# Patient Record
Sex: Female | Born: 1965
Health system: Southern US, Community
[De-identification: ages and names within clinical notes are randomized; demographics above are authoritative.]

## PROBLEM LIST (undated history)

## (undated) ENCOUNTER — Inpatient Hospital Stay: Admission: EM | Payer: Self-pay | Source: Home / Self Care

## (undated) DIAGNOSIS — I493 Ventricular premature depolarization: Secondary | ICD-10-CM

## (undated) DIAGNOSIS — I10 Essential (primary) hypertension: Secondary | ICD-10-CM

## (undated) DIAGNOSIS — N289 Disorder of kidney and ureter, unspecified: Secondary | ICD-10-CM

## (undated) DIAGNOSIS — R519 Headache, unspecified: Secondary | ICD-10-CM

## (undated) DIAGNOSIS — M797 Fibromyalgia: Secondary | ICD-10-CM

## (undated) DIAGNOSIS — Z8673 Personal history of transient ischemic attack (TIA), and cerebral infarction without residual deficits: Secondary | ICD-10-CM

## (undated) DIAGNOSIS — I639 Cerebral infarction, unspecified: Secondary | ICD-10-CM

## (undated) DIAGNOSIS — D751 Secondary polycythemia: Secondary | ICD-10-CM

## (undated) DIAGNOSIS — G4730 Sleep apnea, unspecified: Secondary | ICD-10-CM

## (undated) DIAGNOSIS — I2510 Atherosclerotic heart disease of native coronary artery without angina pectoris: Secondary | ICD-10-CM

## (undated) DIAGNOSIS — G7111 Myotonic muscular dystrophy: Secondary | ICD-10-CM

## (undated) DIAGNOSIS — I340 Nonrheumatic mitral (valve) insufficiency: Secondary | ICD-10-CM

## (undated) HISTORY — DX: Secondary polycythemia: D75.1

## (undated) HISTORY — DX: Ventricular premature depolarization: I49.3

## (undated) HISTORY — DX: Fibromyalgia: M79.7

## (undated) HISTORY — DX: Myotonic muscular dystrophy: G71.11

## (undated) HISTORY — PX: APPENDECTOMY: SHX54

## (undated) HISTORY — DX: Essential (primary) hypertension: I10

## (undated) HISTORY — PX: CHOLECYSTECTOMY: SHX55

## (undated) HISTORY — PX: ANKLE SURGERY: SHX546

## (undated) HISTORY — PX: TONSILLECTOMY: SUR1361

---

## 2013-07-22 HISTORY — PX: BREAST BIOPSY: SHX20

## 2013-12-17 ENCOUNTER — Ambulatory Visit: Payer: Self-pay

## 2013-12-29 ENCOUNTER — Ambulatory Visit: Payer: Self-pay

## 2014-01-11 ENCOUNTER — Ambulatory Visit: Payer: Self-pay

## 2014-01-12 LAB — PATHOLOGY REPORT

## 2014-06-06 ENCOUNTER — Ambulatory Visit: Payer: Self-pay

## 2015-10-09 ENCOUNTER — Other Ambulatory Visit: Payer: Self-pay | Admitting: Nurse Practitioner

## 2015-10-09 DIAGNOSIS — Z1231 Encounter for screening mammogram for malignant neoplasm of breast: Secondary | ICD-10-CM

## 2015-10-16 ENCOUNTER — Ambulatory Visit: Payer: Self-pay | Attending: Nurse Practitioner

## 2016-07-30 ENCOUNTER — Encounter (HOSPITAL_COMMUNITY): Payer: Self-pay | Admitting: Emergency Medicine

## 2016-07-30 ENCOUNTER — Emergency Department (HOSPITAL_COMMUNITY): Payer: Medicaid Other

## 2016-07-30 ENCOUNTER — Emergency Department (HOSPITAL_COMMUNITY)
Admission: EM | Admit: 2016-07-30 | Discharge: 2016-07-30 | Disposition: A | Discharge status: Home / Self Care | Payer: Medicaid Other | Attending: Emergency Medicine | Admitting: Emergency Medicine

## 2016-07-30 DIAGNOSIS — Z79899 Other long term (current) drug therapy: Secondary | ICD-10-CM | POA: Insufficient documentation

## 2016-07-30 DIAGNOSIS — I10 Essential (primary) hypertension: Secondary | ICD-10-CM | POA: Diagnosis not present

## 2016-07-30 DIAGNOSIS — J189 Pneumonia, unspecified organism: Secondary | ICD-10-CM | POA: Diagnosis not present

## 2016-07-30 DIAGNOSIS — F17210 Nicotine dependence, cigarettes, uncomplicated: Secondary | ICD-10-CM | POA: Diagnosis not present

## 2016-07-30 DIAGNOSIS — R05 Cough: Secondary | ICD-10-CM | POA: Diagnosis present

## 2016-07-30 HISTORY — DX: Essential (primary) hypertension: I10

## 2016-07-30 MED ORDER — KETOROLAC TROMETHAMINE 30 MG/ML IJ SOLN
30.0000 mg | Freq: Once | INTRAMUSCULAR | Status: AC
  Administered 2016-07-30: 30 mg via INTRAMUSCULAR
  Filled 2016-07-30: qty 1

## 2016-07-30 MED ORDER — AZITHROMYCIN 250 MG PO TABS
500.0000 mg | ORAL_TABLET | Freq: Once | ORAL | Status: AC
  Administered 2016-07-30: 500 mg via ORAL
  Filled 2016-07-30: qty 2

## 2016-07-30 MED ORDER — AMOXICILLIN 250 MG PO CAPS
1000.0000 mg | ORAL_CAPSULE | Freq: Once | ORAL | Status: AC
  Administered 2016-07-30: 1000 mg via ORAL
  Filled 2016-07-30: qty 4

## 2016-07-30 MED ORDER — AZITHROMYCIN 250 MG PO TABS: 250.0000 mg | ORAL_TABLET | Freq: Every day | ORAL | 0 refills | Status: DC

## 2016-07-30 MED ORDER — AMOXICILLIN 500 MG PO CAPS: 1000.0000 mg | ORAL_CAPSULE | Freq: Two times a day (BID) | ORAL | 0 refills | Status: DC

## 2016-07-30 NOTE — ED Triage Notes (Signed)
Pt reports fever, aches, and cough since yesterday.

## 2016-08-12 NOTE — ED Provider Notes (Signed)
[facility] DEPT Provider Note   CSN: [id number] Arrival date & time: 07/30/16  [id number]     History   Chief Complaint Chief Complaint  Patient presents with  . Generalized Body Aches    HPI Anita Michael is a 51 y.o. female.  HPI   51 year old female with cough, body aches and subjective fever. Gradual onset yesterday. Persistent since then. Cough is occasionally productive for whitish sputum. Mild shortness of breath. No unusual leg pain or swelling. No rash. No nausea, vomiting or diarrhea. No dizziness or lightheadedness.  Past Medical History:  Diagnosis Date  . Hypertension     There are no active problems to display for this patient.   Past Surgical History:  Procedure Laterality Date  . APPENDECTOMY    . CHOLECYSTECTOMY    . TONSILLECTOMY      OB History    No data available       Home Medications    Prior to Admission medications   Medication Sig Start Date End Date Taking? Authorizing Provider  olmesartan (BENICAR) 40 MG tablet Take 40 mg by mouth daily.   Yes Historical Provider, MD  amoxicillin (AMOXIL) 500 MG capsule Take 2 capsules (1,000 mg total) by mouth 2 (two) times daily. 07/30/16   Virgel Manifold, MD  azithromycin (ZITHROMAX Z-PAK) 250 MG tablet Take 1 tablet (250 mg total) by mouth daily. 2 pills (500mg ) day 1 then 1 pill (250mg ) days 2-5. 07/30/16   Virgel Manifold, MD    Family History History reviewed. No pertinent family history.  Social History Social History  Substance Use Topics  . Smoking status: Current Every Day Smoker    Packs/day: 0.50    Types: Cigarettes  . Smokeless tobacco: Never Used  . Alcohol use No     Allergies   Patient has no known allergies.   Review of Systems Review of Systems  All systems reviewed and negative, other than as noted in HPI.   Physical Exam Updated Vital Signs BP (!) 106/51 (BP Location: Right Arm)   Pulse 89   Temp 98.8 F (37.1 C) (Oral)   Resp 20   Ht 5\' 8"  (1.727 m)   Wt 205  lb (93 kg)   SpO2 92%   BMI 31.17 kg/m   Physical Exam  Constitutional: She appears well-developed and well-nourished. No distress.  HENT:  Head: Normocephalic and atraumatic.  Eyes: Conjunctivae are normal. Right eye exhibits no discharge. Left eye exhibits no discharge.  Neck: Neck supple.  Cardiovascular: Normal rate, regular rhythm and normal heart sounds.  Exam reveals no gallop and no friction rub.   No murmur heard. Pulmonary/Chest: Effort normal. No respiratory distress.  Rhonchorous breath sounds bilaterally. This patient complete sentences. No exertional muscle usage. Occasional cough.  Abdominal: Soft. She exhibits no distension. There is no tenderness.  Musculoskeletal: She exhibits no edema or tenderness.  Lower extremities symmetric as compared to each other. No calf tenderness. Negative Homan's. No palpable cords.   Neurological: She is alert.  Skin: Skin is warm and dry.  Psychiatric: She has a normal mood and affect. Her behavior is normal. Thought content normal.  Nursing note and vitals reviewed.    ED Treatments / Results  Labs (all labs ordered are listed, but only abnormal results are displayed) Labs Reviewed - No data to display  EKG  EKG Interpretation None       Radiology No results found.   Dg Chest 2 View  Result Date: 07/30/2016 CLINICAL DATA:  Cough  and fever since yesterday. Current smoker. History of hypertension. EXAM: CHEST  2 VIEW COMPARISON:  None in PACs FINDINGS: There is patchy interstitial density in the mid and lower lungs bilaterally. No alveolar infiltrates are observed. The heart is top-normal in size. The pulmonary vascularity is normal. The trachea is midline. There is no pleural effusion. The bony thorax exhibits no acute abnormality. IMPRESSION: Bilateral atelectasis or interstitial pneumonia in the mid and lower lungs. No alveolar infiltrates. No CHF. Followup PA and lateral chest X-ray is recommended in 3-4 weeks following  trial of antibiotic therapy to ensure resolution. Electronically Signed   By: David  Martinique M.D.   On: 07/30/2016 08:25    Procedures Procedures (including critical care time)  Medications Ordered in ED Medications  ketorolac (TORADOL) 30 MG/ML injection 30 mg (30 mg Intramuscular Given 07/30/16 0806)  amoxicillin (AMOXIL) capsule 1,000 mg (1,000 mg Oral Given 07/30/16 0859)  azithromycin (ZITHROMAX) tablet 500 mg (500 mg Oral Given 07/30/16 0859)     Initial Impression / Assessment and Plan / ED Course  I have reviewed the triage vital signs and the nursing notes.  Pertinent labs & imaging results that were available during my care of the patient were reviewed by me and considered in my medical decision making (see chart for details).     51 year old female with fever, body aches and cough. Imaging significant for likely pneumonia. Oxygen saturation is borderline, but she has a long smoking history. Her breathing is not significantly increased on exam. At this time I feel that she is appropriate for outpatient treatment. Patient's current or discharge. She'll be started on azithromycin and amoxicillin. Return precautions were discussed. Outpatient follow-up.  Final Clinical Impressions(s) / ED Diagnoses   Final diagnoses:  Community acquired pneumonia, unspecified laterality    New Prescriptions Discharge Medication List as of 07/30/2016  9:02 AM    START taking these medications   Details  amoxicillin (AMOXIL) 500 MG capsule Take 2 capsules (1,000 mg total) by mouth 2 (two) times daily., Starting Tue 07/30/2016, Print    azithromycin (ZITHROMAX Z-PAK) 250 MG tablet Take 1 tablet (250 mg total) by mouth daily. 2 pills (500mg ) day 1 then 1 pill (250mg ) days 2-5., Starting Tue 07/30/2016, Print         Virgel Manifold, MD 08/12/16 1400

## 2017-12-10 ENCOUNTER — Ambulatory Visit: Payer: Self-pay | Admitting: Family Medicine

## 2018-02-12 ENCOUNTER — Other Ambulatory Visit: Payer: Self-pay | Admitting: Family Medicine

## 2018-02-12 DIAGNOSIS — Z1231 Encounter for screening mammogram for malignant neoplasm of breast: Secondary | ICD-10-CM

## 2018-03-02 ENCOUNTER — Ambulatory Visit
Admission: RE | Admit: 2018-03-02 | Discharge: 2018-03-02 | Disposition: A | Discharge status: Home / Self Care | Payer: PRIVATE HEALTH INSURANCE | Source: Ambulatory Visit | Attending: Family Medicine | Admitting: Family Medicine

## 2018-03-02 DIAGNOSIS — Z1231 Encounter for screening mammogram for malignant neoplasm of breast: Secondary | ICD-10-CM | POA: Insufficient documentation

## 2018-05-14 LAB — GLUCOSE, POCT (MANUAL RESULT ENTRY): POC GLUCOSE: 104 mg/dL — AB (ref 70–99)

## 2019-01-18 ENCOUNTER — Telehealth: Payer: Self-pay | Admitting: Family

## 2019-02-16 DIAGNOSIS — G43109 Migraine with aura, not intractable, without status migrainosus: Secondary | ICD-10-CM | POA: Insufficient documentation

## 2019-02-16 DIAGNOSIS — G43909 Migraine, unspecified, not intractable, without status migrainosus: Secondary | ICD-10-CM | POA: Insufficient documentation

## 2019-02-16 DIAGNOSIS — R29810 Facial weakness: Secondary | ICD-10-CM | POA: Insufficient documentation

## 2019-03-25 ENCOUNTER — Telehealth: Payer: Self-pay | Admitting: Family

## 2019-05-10 ENCOUNTER — Other Ambulatory Visit: Payer: Self-pay

## 2019-05-10 ENCOUNTER — Telehealth: Payer: Self-pay | Admitting: Cardiovascular Disease

## 2019-05-10 ENCOUNTER — Encounter: Payer: Self-pay | Admitting: *Deleted

## 2019-05-10 ENCOUNTER — Ambulatory Visit (INDEPENDENT_AMBULATORY_CARE_PROVIDER_SITE_OTHER): Payer: Medicaid Other | Admitting: Cardiovascular Disease

## 2019-05-10 ENCOUNTER — Encounter: Payer: Self-pay | Admitting: Cardiovascular Disease

## 2019-05-10 VITALS — BP 140/83 | HR 72 | Ht 68.0 in | Wt 218.8 lb

## 2019-05-10 DIAGNOSIS — I10 Essential (primary) hypertension: Secondary | ICD-10-CM

## 2019-05-10 DIAGNOSIS — Z23 Encounter for immunization: Secondary | ICD-10-CM

## 2019-05-10 DIAGNOSIS — R079 Chest pain, unspecified: Secondary | ICD-10-CM

## 2019-05-10 DIAGNOSIS — I499 Cardiac arrhythmia, unspecified: Secondary | ICD-10-CM

## 2019-05-10 DIAGNOSIS — R9431 Abnormal electrocardiogram [ECG] [EKG]: Secondary | ICD-10-CM

## 2019-05-10 DIAGNOSIS — I493 Ventricular premature depolarization: Secondary | ICD-10-CM | POA: Diagnosis not present

## 2019-05-10 DIAGNOSIS — R5383 Other fatigue: Secondary | ICD-10-CM

## 2019-05-10 NOTE — Telephone Encounter (Signed)
Pre-cert Verification for the following procedure   Exercise Stress scheduled for 05-24-2019 at [facility].

## 2019-05-10 NOTE — Patient Instructions (Signed)
Your physician recommends that you schedule a follow-up appointment in: [place]  Your physician recommends that you continue on your current medications as directed. Please refer to the Current Medication list given to you today.  Your physician has requested that you have an echocardiogram. Echocardiography is a painless test that uses sound waves to create images of your heart. It provides your doctor with information about the size and shape of your heart and how well your heart's chambers and valves are working. This procedure takes approximately one hour. There are no restrictions for this procedure.  Your physician has requested that you have en exercise stress myoview. For further information please visit [email or web address]. Please follow instruction sheet, as given.  Thank you for choosing [facility]!!

## 2019-05-10 NOTE — Progress Notes (Signed)
CARDIOLOGY CONSULT NOTE  Patient ID: Anita Michael MRN: [id number] DOB/AGE: 30-May-1966 53 y.o.  Admit date: (Not on file) Primary Physician: Wyatt Haste, NP Referring Physician: Alfonse Flavors,*.    Reason for Consultation: Arrhythmia, abnormal EKG  HPI: Anita Michael is a 53 y.o. female who is being seen today for the evaluation of arrhythmia and abnormal EKG at the request of Zhou-Talbert, Serena S,*.   I reviewed notes from her PCP.  I personally reviewed an ECG performed on 04/05/2019 which demonstrated what appeared to be sinus rhythm with frequent PVCs and T wave inversions in leads V3 through V5 and nonspecific T wave abnormalities in V6.  There was a nonspecific intraventricular conduction delay, QRS duration 116 ms.  She has a history of hypertension dating back to when she was 53 years old.  She has no family history of hypertension.  She also has a history of fibromyalgia and has pain in the upper chest, both arms, and upper back.  She recently fell when she was about to sit on a stool and it slid from underneath her.  She hit the left side of her chest.  She told me she had x-rays done and she may have strained a chest wall muscle.  She denies exertional chest pain and dyspnea.  She denies leg swelling, orthopnea, and paroxysmal nocturnal dyspnea.  She denies palpitations, lightheadedness, dizziness, and syncope.  She does describe exertional fatigue.  She has chest pains but it appears to be related to her fibromyalgia as she has chest wall tenderness.  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  I personally reviewed an ECG performed today which demonstrates sinus rhythm with frequent PVCs, diffuse nonspecific T wave abnormalities, and right bundle branch block.  She has been smoking for roughly 30 years and quit within the last several weeks.  Family history: Father died of muscular dystrophy.  Mother had multiple sclerosis.  Social  history: She has been widowed since 2016-11-06.  Her husband died of the flu.  She has 6 children.  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    Allergies  Allergen Reactions  . Procardia [Nifedipine] Other (See Comments)    Heart races    Current Outpatient Medications  Medication Sig Dispense Refill  . cholecalciferol (VITAMIN D3) 25 MCG (1000 UT) tablet Take 1,000 Units by mouth daily.    [name] losartan (COZAAR) 50 MG tablet Take 1 tablet by mouth daily.     No current facility-administered medications for this visit.     Past Medical History:  Diagnosis Date  . Hypertension     Past Surgical History:  Procedure Laterality Date  . APPENDECTOMY    . BREAST BIOPSY Right 2013/11/06   Fibroadenoma  . CHOLECYSTECTOMY    . TONSILLECTOMY      Social History   Socioeconomic History  . Marital status: Married    Spouse name: Not on file  . Number of children: Not on file  . Years of education: Not on file  . Highest education level: Not on file  Occupational History  . Not on file  Social Needs  . Financial resource strain: Not on file  . Food insecurity    Worry: Not on file    Inability: Not on file  . Transportation needs    Medical: Not on file    Non-medical: Not on file  Tobacco Use  .  Smoking status: Former Smoker    Packs/day: 0.50    Types: Cigarettes    Quit date: 02/07/2019    Years since quitting: 0.2  . Smokeless tobacco: Never Used  Substance and Sexual Activity  . Alcohol use: No  . Drug use: No  . Sexual activity: Not on file  Lifestyle  . Physical activity    Days per week: Not on file    Minutes per session: Not on file  . Stress: Not on file  Relationships  . Social Herbalist on phone: Not on file    Gets together: Not on file    Attends religious service: Not on file    Active member of club or organization: Not on file    Attends meetings of clubs or organizations: Not on file     Relationship status: Not on file  . Intimate partner violence    Fear of current or ex partner: Not on file    Emotionally abused: Not on file    Physically abused: Not on file    Forced sexual activity: Not on file  Other Topics Concern  . Not on file  Social History Narrative  . Not on file      Current Meds  Medication Sig  . cholecalciferol (VITAMIN D3) 25 MCG (1000 UT) tablet Take 1,000 Units by mouth daily.  [name] losartan (COZAAR) 50 MG tablet Take 1 tablet by mouth daily.      Review of systems complete and found to be negative unless listed above in HPI    Physical exam Blood pressure (!) 154/98, pulse 63, height 5\' 8"  (1.727 m), weight 218 lb 12.8 oz (99.2 kg), SpO2 96 %. General: NAD Neck: No JVD, no thyromegaly or thyroid nodule.  Lungs: Clear to auscultation bilaterally with normal respiratory effort. CV: Nondisplaced PMI. Regular rate and irregular rhythm with frequent premature contractions, normal S1/S2, no S3/S4, no murmur.  No peripheral edema.  No carotid bruit.    Abdomen: Soft, nontender, no distention.  Skin: Intact without lesions or rashes.  Neurologic: Alert and oriented x 3.  Psych: Normal affect. Extremities: No clubbing or cyanosis.  HEENT: Normal.   ECG: Most recent ECG reviewed.   Labs: No results found for: K, BUN, CREATININE, ALT, TSH, HGB   Lipids: No results found for: LDLCALC, LDLDIRECT, CHOL, TRIG, HDL      ASSESSMENT AND PLAN:  1.  Arrhythmia/abnormal EKG: Initial ECG from 04/05/2019 reviewed above with sinus rhythm, precordial T wave inversions, frequent PVCs, and nonspecific intraventricular conduction delay.  Today's ECG also demonstrates frequent PVCs with diffuse nonspecific T wave abnormalities and right bundle branch block.  She appears to be asymptomatic from this standpoint. I will order a 2-D echocardiogram with Doppler to evaluate cardiac structure, function, and regional wall motion.  2.  Chest pain/exertional fatigue:  While she denies exertional chest pain she does describe exertional fatigue performing activities that she was able to perform with relative ease roughly 1 year ago.  Risk factors include hypertension and tobacco use.  ECG is abnormal and described above.  I will obtain an exercise Myoview to evaluate for ischemic heart disease.  3.  Hypertension: She was diagnosed with hypertension at age 76.  Blood pressure is currently borderline.  This will need further monitoring.    Disposition: Follow up in 3 months  Signed: Kate Sable, M.D., F.A.C.C.  05/10/2019, 10:11 AM

## 2019-05-14 ENCOUNTER — Other Ambulatory Visit (HOSPITAL_COMMUNITY): Payer: PRIVATE HEALTH INSURANCE

## 2019-05-21 ENCOUNTER — Other Ambulatory Visit (HOSPITAL_COMMUNITY)
Admission: RE | Admit: 2019-05-21 | Discharge: 2019-05-21 | Disposition: A | Discharge status: Home / Self Care | Payer: Medicaid Other | Source: Ambulatory Visit | Attending: Cardiovascular Disease | Admitting: Cardiovascular Disease

## 2019-05-21 ENCOUNTER — Other Ambulatory Visit: Payer: Self-pay

## 2019-05-21 DIAGNOSIS — Z20828 Contact with and (suspected) exposure to other viral communicable diseases: Secondary | ICD-10-CM | POA: Insufficient documentation

## 2019-05-21 DIAGNOSIS — Z01812 Encounter for preprocedural laboratory examination: Secondary | ICD-10-CM | POA: Diagnosis present

## 2019-05-21 LAB — SARS CORONAVIRUS 2 (TAT 6-24 HRS): SARS Coronavirus 2: NEGATIVE

## 2019-05-24 ENCOUNTER — Encounter (HOSPITAL_COMMUNITY): Admission: RE | Admit: 2019-05-24 | Payer: Medicaid Other | Source: Ambulatory Visit

## 2019-05-24 ENCOUNTER — Encounter (HOSPITAL_COMMUNITY): Payer: PRIVATE HEALTH INSURANCE

## 2019-05-25 ENCOUNTER — Telehealth: Payer: Self-pay | Admitting: *Deleted

## 2019-05-25 NOTE — Telephone Encounter (Signed)
Notes recorded by Laurine Blazer, LPN on [id number] at QA348G PM EST  Patient notified via detailed voice mail. Copy to pmd.  ------   Notes recorded by Arnoldo Lenis, MD on 05/25/2019 at 12:48 PM EST  Negative covid test    Zandra Abts MD

## 2019-06-03 ENCOUNTER — Telehealth: Payer: Self-pay | Admitting: Cardiovascular Disease

## 2019-06-03 ENCOUNTER — Ambulatory Visit (INDEPENDENT_AMBULATORY_CARE_PROVIDER_SITE_OTHER): Payer: Medicaid Other

## 2019-06-03 ENCOUNTER — Other Ambulatory Visit: Payer: Self-pay

## 2019-06-03 DIAGNOSIS — R079 Chest pain, unspecified: Secondary | ICD-10-CM | POA: Diagnosis not present

## 2019-06-03 NOTE — Telephone Encounter (Signed)
Can go Tuesday 06/08/2019 at [facility].

## 2019-06-03 NOTE — Telephone Encounter (Signed)
Patient returned call

## 2019-06-03 NOTE — Telephone Encounter (Signed)
Patient asking to have EXERCISE MYOVIEW rescheduled.  She arrived too late for the first one.  She will need a new  - COVID TEST scheduled 72 hrs prior to having test done

## 2019-06-03 NOTE — Telephone Encounter (Signed)
Pt aware and appreciative - will forward to schedulers to make this change

## 2019-06-03 NOTE — Telephone Encounter (Signed)
Lexiscan would be fine.

## 2019-06-03 NOTE — Telephone Encounter (Signed)
LM to return call.

## 2019-06-03 NOTE — Telephone Encounter (Signed)
°  Precert needed for: Exercise Myoview   Location: [facility]    Date: Jun 11, 2019

## 2019-06-03 NOTE — Telephone Encounter (Signed)
Pt aware that stress test is scheduled for 11/20 and would need repeat COVID testing - pt needs to contact job to see about taking extra days off - will forward to provider if can change this to [facility] so that pt could avoid another COVID test

## 2019-06-04 ENCOUNTER — Other Ambulatory Visit: Payer: Self-pay | Admitting: *Deleted

## 2019-06-04 DIAGNOSIS — R079 Chest pain, unspecified: Secondary | ICD-10-CM

## 2019-06-04 NOTE — Telephone Encounter (Signed)
Patients Exercise Myoview scheduled for 06-11-2019 has been changed to [facility] scheduled for 06-11-2019

## 2019-06-11 ENCOUNTER — Encounter: Payer: Self-pay | Admitting: *Deleted

## 2019-06-11 ENCOUNTER — Telehealth: Payer: Self-pay | Admitting: *Deleted

## 2019-06-11 ENCOUNTER — Encounter (HOSPITAL_COMMUNITY)
Admission: RE | Admit: 2019-06-11 | Discharge: 2019-06-11 | Disposition: A | Discharge status: Home / Self Care | Payer: Medicaid Other | Source: Ambulatory Visit | Attending: Cardiovascular Disease | Admitting: Cardiovascular Disease

## 2019-06-11 ENCOUNTER — Other Ambulatory Visit: Payer: Self-pay

## 2019-06-11 ENCOUNTER — Other Ambulatory Visit (HOSPITAL_COMMUNITY): Payer: Medicaid Other

## 2019-06-11 ENCOUNTER — Encounter: Payer: Self-pay | Admitting: Cardiovascular Disease

## 2019-06-11 ENCOUNTER — Encounter (HOSPITAL_COMMUNITY): Payer: Medicaid Other

## 2019-06-11 DIAGNOSIS — R079 Chest pain, unspecified: Secondary | ICD-10-CM | POA: Insufficient documentation

## 2019-06-11 NOTE — Telephone Encounter (Signed)
Copy to pmd.    Notes recorded by Laurine Blazer, LPN on [id number] at 5:35 PM EST  Attempted to return call again - not available. Will mail letter for notification.  ------   Notes recorded by Laurine Blazer, LPN on [id number] at 11:40 AM EST  Left message to return call.   ------   Notes recorded by Laurine Blazer, LPN on [id number] at 5:43 PM EST  Left message to return call.   ------   Notes recorded by Herminio Commons, MD on 06/03/2019 at 12:55 PM EST  Pumping function is normal. There is some stiffness when the heart relaxes. Some mitral valve leakage is also noted.

## 2019-06-28 ENCOUNTER — Encounter (HOSPITAL_COMMUNITY)
Admission: RE | Admit: 2019-06-28 | Discharge: 2019-06-28 | Disposition: A | Discharge status: Home / Self Care | Payer: Medicaid Other | Source: Ambulatory Visit | Attending: Cardiovascular Disease | Admitting: Cardiovascular Disease

## 2019-06-28 ENCOUNTER — Telehealth: Payer: Self-pay | Admitting: *Deleted

## 2019-06-28 ENCOUNTER — Other Ambulatory Visit: Payer: Self-pay

## 2019-06-28 ENCOUNTER — Ambulatory Visit (HOSPITAL_COMMUNITY)
Admission: RE | Admit: 2019-06-28 | Discharge: 2019-06-28 | Disposition: A | Discharge status: Home / Self Care | Payer: Medicaid Other | Source: Ambulatory Visit | Attending: Cardiovascular Disease | Admitting: Cardiovascular Disease

## 2019-06-28 DIAGNOSIS — R079 Chest pain, unspecified: Secondary | ICD-10-CM | POA: Diagnosis present

## 2019-06-28 LAB — NM MYOCAR MULTI W/SPECT W/WALL MOTION / EF
LV dias vol: 78 mL (ref 46–106)
LV sys vol: 38 mL
Peak HR: 96 {beats}/min
RATE: 0.42
Rest HR: 62 {beats}/min
SDS: 2
SRS: 5
SSS: 7
TID: 1.08

## 2019-06-28 MED ORDER — REGADENOSON 0.4 MG/5ML IV SOLN
INTRAVENOUS | Status: AC
  Administered 2019-06-28: 0.4 mg via INTRAVENOUS
  Filled 2019-06-28: qty 5

## 2019-06-28 MED ORDER — TECHNETIUM TC 99M TETROFOSMIN IV KIT
10.0000 | PACK | Freq: Once | INTRAVENOUS | Status: AC | PRN
  Administered 2019-06-28: 9 via INTRAVENOUS

## 2019-06-28 MED ORDER — SODIUM CHLORIDE FLUSH 0.9 % IV SOLN
INTRAVENOUS | Status: AC
  Administered 2019-06-28: 10 mL via INTRAVENOUS
  Filled 2019-06-28: qty 10

## 2019-06-28 MED ORDER — TECHNETIUM TC 99M TETROFOSMIN IV KIT
30.0000 | PACK | Freq: Once | INTRAVENOUS | Status: AC | PRN
  Administered 2019-06-28: 32 via INTRAVENOUS

## 2019-06-28 NOTE — Telephone Encounter (Signed)
Notes recorded by Laurine Blazer, LPN on [id number] at [id number] PM EST  Left message to return call.   ------   Notes recorded by Herminio Commons, MD on 06/28/2019 at 1:42 PM EST  Low risk for significant blockages. There were some areas where there may be a mild degree of scar tissue.

## 2019-06-30 NOTE — Telephone Encounter (Signed)
Notes recorded by Laurine Blazer, LPN on [id number] at 579FGE AM EST  Patient notified. Copy to pmd. Follow up scheduled for January 2021 with Dr. Bronson Ing.  ------

## 2019-07-13 ENCOUNTER — Other Ambulatory Visit (HOSPITAL_COMMUNITY): Payer: Self-pay | Admitting: Family

## 2019-07-13 DIAGNOSIS — Z1231 Encounter for screening mammogram for malignant neoplasm of breast: Secondary | ICD-10-CM

## 2019-08-02 ENCOUNTER — Encounter (HOSPITAL_COMMUNITY): Payer: Self-pay

## 2019-08-02 ENCOUNTER — Ambulatory Visit (HOSPITAL_COMMUNITY): Admission: RE | Admit: 2019-08-02 | Payer: Self-pay | Source: Ambulatory Visit

## 2019-08-06 ENCOUNTER — Encounter: Payer: Self-pay | Admitting: Cardiovascular Disease

## 2019-08-06 ENCOUNTER — Ambulatory Visit (INDEPENDENT_AMBULATORY_CARE_PROVIDER_SITE_OTHER): Payer: Medicaid Other | Admitting: Cardiovascular Disease

## 2019-08-06 ENCOUNTER — Other Ambulatory Visit: Payer: Self-pay

## 2019-08-06 VITALS — BP 143/94 | HR 119 | Ht 67.5 in | Wt 220.4 lb

## 2019-08-06 DIAGNOSIS — R5383 Other fatigue: Secondary | ICD-10-CM

## 2019-08-06 DIAGNOSIS — R9431 Abnormal electrocardiogram [ECG] [EKG]: Secondary | ICD-10-CM | POA: Diagnosis not present

## 2019-08-06 DIAGNOSIS — I493 Ventricular premature depolarization: Secondary | ICD-10-CM

## 2019-08-06 DIAGNOSIS — I10 Essential (primary) hypertension: Secondary | ICD-10-CM

## 2019-08-06 DIAGNOSIS — R079 Chest pain, unspecified: Secondary | ICD-10-CM | POA: Diagnosis not present

## 2019-08-06 DIAGNOSIS — R002 Palpitations: Secondary | ICD-10-CM

## 2019-08-06 DIAGNOSIS — I340 Nonrheumatic mitral (valve) insufficiency: Secondary | ICD-10-CM

## 2019-08-06 DIAGNOSIS — I499 Cardiac arrhythmia, unspecified: Secondary | ICD-10-CM

## 2019-08-06 MED ORDER — LOSARTAN POTASSIUM 50 MG PO TABS: 75.0000 mg | ORAL_TABLET | Freq: Every day | ORAL | 1 refills | Status: DC

## 2019-08-06 NOTE — Patient Instructions (Signed)
Your physician recommends that you schedule a follow-up appointment in: [place] physician has recommended you make the following change in your medication:   INCREASE LOSARTAN 75 MG (1.5 TABLETS) DAILY   Thank you for choosing [facility]!!

## 2019-08-06 NOTE — Progress Notes (Signed)
SUBJECTIVE: The patient presents for follow-up after undergoing cardiac testing.  She has a history of hypertension and fibromyalgia.  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  ECG showed resting T wave inversions in leads II, aVF, and V3 through V6 at the time of stress testing.  There is a defect of mild severity present in the mid anteroseptal, mid inferoseptal, and mid inferolateral location.  The anteroseptal defect may have been due to a conduction defect.  A small area of scar cannot entirely be ruled out.  Overall, it was a low risk study with no large ischemic territory seen, calculated EF 51%.  Echocardiogram demonstrated normal LV systolic function, EF 50 to 55%, there was mild LVH and grade 2 diastolic dysfunction.  There was mild to moderate mitral regurgitation.  She has had some left-sided rib pain in the left upper quadrant region ever since the fall.  She denies exertional chest pain.  She has occasional palpitations.  She has fibromyalgia which affects her quadriceps and hamstrings if she has been on her feet too long.  She denies leg swelling per se.  She had just got into an argument with her 56 year old grandson prior to this appointment and thinks that is why her heart rate is elevated today.   Social history: She has been widowed since 2016-10-26.  Her husband died of the flu.  She has 6 children.  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  She has custody of her 34 year old grandson who lives with her.  Review of Systems: As per "subjective", otherwise negative.  Allergies  Allergen Reactions  . Procardia [Nifedipine] Other (See Comments)    Heart races    Current Outpatient Medications  Medication Sig Dispense Refill  . cholecalciferol (VITAMIN D3) 25 MCG (1000 UT) tablet Take 1,000 Units by mouth daily.    [name] losartan (COZAAR) 50 MG tablet Take 1 tablet by mouth daily.     No current facility-administered  medications for this visit.    Past Medical History:  Diagnosis Date  . Hypertension     Past Surgical History:  Procedure Laterality Date  . APPENDECTOMY    . BREAST BIOPSY Right 10-26-13   Fibroadenoma  . CHOLECYSTECTOMY    . TONSILLECTOMY      Social History   Socioeconomic History  . Marital status: Married    Spouse name: Not on file  . Number of children: Not on file  . Years of education: Not on file  . Highest education level: Not on file  Occupational History  . Not on file  Tobacco Use  . Smoking status: Former Smoker    Packs/day: 0.50    Types: Cigarettes    Quit date: 02/07/2019    Years since quitting: 0.4  . Smokeless tobacco: Never Used  Substance and Sexual Activity  . Alcohol use: No  . Drug use: No  . Sexual activity: Not on file  Other Topics Concern  . Not on file  Social History Narrative  . Not on file   Social Determinants of Health   Financial Resource Strain:   . Difficulty of Paying Living Expenses: Not on file  Food Insecurity:   . Worried About Charity fundraiser in the Last Year: Not on file  . Ran Out of Food in the Last Year: Not on file  Transportation Needs:   . Lack of Transportation (Medical): Not on  file  . Lack of Transportation (Non-Medical): Not on file  Physical Activity:   . Days of Exercise per Week: Not on file  . Minutes of Exercise per Session: Not on file  Stress:   . Feeling of Stress : Not on file  Social Connections:   . Frequency of Communication with Friends and Family: Not on file  . Frequency of Social Gatherings with Friends and Family: Not on file  . Attends Religious Services: Not on file  . Active Member of Clubs or Organizations: Not on file  . Attends Archivist Meetings: Not on file  . Marital Status: Not on file  Intimate Partner Violence:   . Fear of Current or Ex-Partner: Not on file  . Emotionally Abused: Not on file  . Physically Abused: Not on file  . Sexually Abused: Not on  file     Vitals:   08/06/19 1616  BP: (!) 143/94  Pulse: (!) 119  SpO2: 97%  Weight: 220 lb 6.4 oz (100 kg)  Height: 5' 7.5" (1.715 m)    Wt Readings from Last 3 Encounters:  08/06/19 220 lb 6.4 oz (100 kg)  05/10/19 218 lb 12.8 oz (99.2 kg)  07/30/16 205 lb (93 kg)     PHYSICAL EXAM General: NAD HEENT: Normal. Neck: No JVD, no thyromegaly. Lungs: Clear to auscultation bilaterally with normal respiratory effort. CV: Mildly tachycardic, regular rhythm, normal S1/S2, no S3/S4, no murmur. No pretibial or periankle edema.   Abdomen: Soft, nontender, no distention.  Neurologic: Alert and oriented.  Psych: Normal affect. Skin: Normal. Musculoskeletal: No gross deformities.      Labs: No results found for: K, BUN, CREATININE, ALT, TSH, HGB   Lipids: No results found for: LDLCALC, LDLDIRECT, CHOL, TRIG, HDL     ASSESSMENT AND PLAN:  1.  Chest pain/exertional fatigue: Nuclear stress test was low risk as detailed above.  A small area of scar could not entirely be ruled out.  There were no large ischemic territories.  2.  Arrhythmia/abnormal EKG: Echocardiogram demonstrated normal LV systolic function with grade 2 diastolic dysfunction.  No diuretic requirement at this time.  3.  Mitral regurgitation: Mild to moderate in severity by most recent echocardiogram.  4.  Hypertension: Blood pressure is mildly elevated.  I will increase losartan 75 mg daily.  5.  Palpitations: She has documented PVCs.  Symptomatically stable at present.   Disposition: Follow up 3 months virtual visit   Kate Sable, M.D., F.A.C.C.

## 2019-08-13 ENCOUNTER — Ambulatory Visit: Payer: PRIVATE HEALTH INSURANCE | Admitting: Cardiovascular Disease

## 2019-10-19 DIAGNOSIS — Z809 Family history of malignant neoplasm, unspecified: Secondary | ICD-10-CM | POA: Insufficient documentation

## 2019-10-19 DIAGNOSIS — D751 Secondary polycythemia: Secondary | ICD-10-CM | POA: Insufficient documentation

## 2019-10-28 ENCOUNTER — Other Ambulatory Visit (HOSPITAL_COMMUNITY): Payer: Self-pay | Admitting: General Practice

## 2019-10-28 DIAGNOSIS — Z1231 Encounter for screening mammogram for malignant neoplasm of breast: Secondary | ICD-10-CM

## 2019-11-11 ENCOUNTER — Ambulatory Visit (HOSPITAL_COMMUNITY)
Admission: RE | Admit: 2019-11-11 | Discharge: 2019-11-11 | Disposition: A | Discharge status: Home / Self Care | Payer: Medicaid Other | Source: Ambulatory Visit | Attending: General Practice | Admitting: General Practice

## 2019-11-11 ENCOUNTER — Encounter (HOSPITAL_COMMUNITY): Payer: Self-pay

## 2019-11-11 DIAGNOSIS — Z09 Encounter for follow-up examination after completed treatment for conditions other than malignant neoplasm: Secondary | ICD-10-CM | POA: Insufficient documentation

## 2019-11-11 DIAGNOSIS — K921 Melena: Secondary | ICD-10-CM | POA: Insufficient documentation

## 2019-11-11 DIAGNOSIS — R58 Hemorrhage, not elsewhere classified: Secondary | ICD-10-CM | POA: Insufficient documentation

## 2019-11-11 DIAGNOSIS — Z1211 Encounter for screening for malignant neoplasm of colon: Secondary | ICD-10-CM | POA: Insufficient documentation

## 2019-11-11 DIAGNOSIS — Z1231 Encounter for screening mammogram for malignant neoplasm of breast: Secondary | ICD-10-CM | POA: Insufficient documentation

## 2019-11-16 ENCOUNTER — Encounter: Payer: Self-pay | Admitting: Cardiovascular Disease

## 2019-11-16 ENCOUNTER — Telehealth (INDEPENDENT_AMBULATORY_CARE_PROVIDER_SITE_OTHER): Payer: Medicaid Other | Admitting: Cardiovascular Disease

## 2019-11-16 VITALS — BP 159/90 | Ht 68.0 in | Wt 217.0 lb

## 2019-11-16 DIAGNOSIS — R9431 Abnormal electrocardiogram [ECG] [EKG]: Secondary | ICD-10-CM

## 2019-11-16 DIAGNOSIS — I493 Ventricular premature depolarization: Secondary | ICD-10-CM

## 2019-11-16 DIAGNOSIS — I499 Cardiac arrhythmia, unspecified: Secondary | ICD-10-CM

## 2019-11-16 DIAGNOSIS — R079 Chest pain, unspecified: Secondary | ICD-10-CM

## 2019-11-16 DIAGNOSIS — R5383 Other fatigue: Secondary | ICD-10-CM | POA: Diagnosis not present

## 2019-11-16 DIAGNOSIS — Z87891 Personal history of nicotine dependence: Secondary | ICD-10-CM

## 2019-11-16 DIAGNOSIS — I10 Essential (primary) hypertension: Secondary | ICD-10-CM

## 2019-11-16 DIAGNOSIS — I340 Nonrheumatic mitral (valve) insufficiency: Secondary | ICD-10-CM

## 2019-11-16 DIAGNOSIS — R002 Palpitations: Secondary | ICD-10-CM

## 2019-11-16 MED ORDER — ISOSORBIDE MONONITRATE ER 30 MG PO TB24: 30.0000 mg | ORAL_TABLET | Freq: Every day | ORAL | 6 refills | Status: DC

## 2019-11-16 MED ORDER — LOSARTAN POTASSIUM 100 MG PO TABS: 100.0000 mg | ORAL_TABLET | Freq: Every day | ORAL | 6 refills | Status: DC

## 2019-11-16 NOTE — Addendum Note (Signed)
Addended by: Laurine Blazer on: 11/16/2019 11:47 AM   Modules accepted: Orders

## 2019-11-16 NOTE — Progress Notes (Signed)
Virtual Visit via Telephone Note   This visit type was conducted due to national recommendations for restrictions regarding the COVID-19 Pandemic (e.g. social distancing) in an effort to limit this patient's exposure and mitigate transmission in our community.  Due to her co-morbid illnesses, this patient is at least at moderate risk for complications without adequate follow up.  This format is felt to be most appropriate for this patient at this time.  The patient did not have access to video technology/had technical difficulties with video requiring transitioning to audio format only (telephone).  All issues noted in this document were discussed and addressed.  No physical exam could be performed with this format.  Please refer to the patient's chart for her  consent to telehealth for [facility].   The patient was identified using 2 identifiers.  Date:  11/16/2019   ID:  Anita Michael, DOB Dec 02, 1965, MRN [id number]  Patient Location: Home Provider Location: Office  PCP:  Wyatt Haste, NP  Cardiologist:  Kate Sable, MD  Electrophysiologist:  None   Evaluation Performed:  Follow-Up Visit  Chief Complaint:  Chest pain  History of Present Illness:    Anita Michael is a 54 y.o. female with chest pain, hypertension, and fibromyalgia.  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  ECG showed resting T wave inversions in leads II, aVF, and V3 through V6 at the time of stress testing.  NST on 06/28/19 showed a defect of mild severity present in the mid anteroseptal, mid inferoseptal, and mid inferolateral location.  The anteroseptal defect may have been due to a conduction defect.  A small area of scar cannot entirely be ruled out.  Overall, it was a low risk study with no large ischemic territory seen, calculated EF 51%.  Echocardiogram on 06/03/19 demonstrated normal LV systolic function, EF 50 to 55%, there was mild LVH and grade 2 diastolic dysfunction.  There was mild  to moderate mitral regurgitation.  She has episodes of chest pain located in the retrosternal region and in the upper central part of her chest.  They occur about once per week.  The last episode was 2 weeks ago and lasted for about 30 minutes.  She denies leg swelling, palpitations, orthopnea, and shortness of breath.  She has been diagnosed with polycythemia and is followed by hematology at [facility].     Past Medical History:  Diagnosis Date  . Hypertension    Past Surgical History:  Procedure Laterality Date  . APPENDECTOMY    . BREAST BIOPSY Right 2015   Fibroadenoma  . CHOLECYSTECTOMY    . TONSILLECTOMY       Current Meds  Medication Sig  . AIMOVIG 70 MG/ML SOAJ Inject 70 mg as directed every 30 (thirty) days.  . cholecalciferol (VITAMIN D3) 25 MCG (1000 UT) tablet Take 1,000 Units by mouth daily.  . cyclobenzaprine (FLEXERIL) 10 MG tablet Take 1 tablet by mouth at bedtime.  [name] losartan (COZAAR) 50 MG tablet Take 1.5 tablets (75 mg total) by mouth daily.     Allergies:   Procardia [nifedipine]   Social History   Tobacco Use  . Smoking status: Former Smoker    Packs/day: 0.50    Types: Cigarettes    Quit date: 02/07/2019    Years since quitting: 0.7  . Smokeless tobacco: Never Used  Substance Use Topics  . Alcohol use: No  . Drug use: No     Family Hx: The patient's family history includes Breast  cancer (age of onset: 68) in her sister; Breast cancer (age of onset: 74) in her sister; Breast cancer (age of onset: 92) in her mother; Breast cancer (age of onset: 60) in her maternal grandmother.  ROS:   Please see the history of present illness.     All other systems reviewed and are negative.   Prior CV studies:   The following studies were reviewed today:  Reviewed above  Labs/Other Tests and Data Reviewed:    EKG:  No ECG reviewed.  Recent Labs: No results found for requested labs within last 8760 hours.   Recent Lipid Panel No results  found for: CHOL, TRIG, HDL, CHOLHDL, LDLCALC, LDLDIRECT  Wt Readings from Last 3 Encounters:  11/16/19 217 lb (98.4 kg)  08/06/19 220 lb 6.4 oz (100 kg)  05/10/19 218 lb 12.8 oz (99.2 kg)     Objective:    Vital Signs:  BP (!) 159/90 Comment: done at oncology office last week  Ht 5\' 8"  (1.727 m)   Wt 217 lb (98.4 kg)   BMI 32.99 kg/m    VITAL SIGNS:  reviewed  ASSESSMENT & PLAN:    1.  Chest pain/exertional fatigue: Nuclear stress test was low risk as detailed above.  A small area of scar could not entirely be ruled out.  There were no large ischemic territories.  She continues to experience episodic chest pains lasting up to 30 minutes.  I will start Imdur 30 mg daily.  2.  Arrhythmia/abnormal EKG: Echocardiogram demonstrated normal LV systolic function with grade 2 diastolic dysfunction.  No diuretic requirement at this time.  3.  Mitral regurgitation: Mild to moderate in severity by most recent echocardiogram.  4.  Hypertension: Blood pressure is elevated.  I will increase losartan to 100 mg daily and add Imdur 30 mg daily for chest pain.  5.  Palpitations: She has documented PVCs.  Symptomatically stable at present.  6.  Polycythemia: Followed by hematology at [facility].   COVID-19 Education: The signs and symptoms of COVID-19 were discussed with the patient and how to seek care for testing (follow up with PCP or arrange E-visit).  The importance of social distancing was discussed today.  Time:   Today, I have spent 25 minutes with the patient with telehealth technology discussing the above problems.     Medication Adjustments/Labs and Tests Ordered: Current medicines are reviewed at length with the patient today.  Concerns regarding medicines are outlined above.   Tests Ordered: No orders of the defined types were placed in this encounter.   Medication Changes: No orders of the defined types were placed in this encounter.   Follow Up:  Virtual Visit   in 3 month(s)  Signed, Kate Sable, MD  11/16/2019 10:00 AM    [place]

## 2019-11-16 NOTE — Patient Instructions (Addendum)
Medication Instructions:   Increase Losartan to 100mg  daily.   Begin Imdur 30mg  daily.   New prescriptions sent to [facility] today.   Continue all other medications.    Labwork: none  Testing/Procedures: none  Follow-Up: 3 months   Any Other Special Instructions Will Be Listed Below (If Applicable).  If you need a refill on your cardiac medications before your next appointment, please call your pharmacy.

## 2019-11-18 ENCOUNTER — Other Ambulatory Visit (HOSPITAL_COMMUNITY): Payer: Self-pay | Admitting: General Practice

## 2019-11-18 DIAGNOSIS — N6320 Unspecified lump in the left breast, unspecified quadrant: Secondary | ICD-10-CM

## 2019-11-22 ENCOUNTER — Ambulatory Visit (HOSPITAL_COMMUNITY): Payer: Medicaid Other

## 2019-12-14 ENCOUNTER — Encounter: Payer: Self-pay | Admitting: Cardiovascular Disease

## 2019-12-14 ENCOUNTER — Encounter (HOSPITAL_COMMUNITY): Payer: Medicaid Other

## 2019-12-14 ENCOUNTER — Ambulatory Visit (HOSPITAL_COMMUNITY): Payer: Medicaid Other

## 2019-12-14 DIAGNOSIS — D126 Benign neoplasm of colon, unspecified: Secondary | ICD-10-CM | POA: Insufficient documentation

## 2019-12-15 NOTE — Progress Notes (Deleted)
Cardiology Office Note  Date: 12/16/2019   ID: Anita Michael, DOB 1966/02/18, MRN [id number]  PCP:  Wyatt Haste, NP  Cardiologist:  Kate Sable, MD Electrophysiologist:  None   Chief Complaint: F/U chest pain, HTN  History of Present Illness: Anita Michael is a 53 y.o. female with a history of CP and HTN, COPD long term smoker who quit 6 months ago.  Last encounter with Dr. Bronson Ing via telemedicine on 11/16/2019. She was having episodes of retrosternal and upper central chest areas occurring once per week. The last episode had occurred 2 weeks prior lasting for 30 minutes. Imdur 30 mg was started. Losartan was increased to 100 mg daily d/t elevated BP.  Recent ED visit to [facility] 12/06/2019 for abdominal pain 3 days post screening colonoscopy. The evening post colonoscopy she began having upper abdominal pain, described as constant, sharp, severe. Worse with eating. No alleviating factors. Associated N/V. CT chest/ abdomen / pelvis : No PE, heterogeneous hazy bilateral lung densities suggesting possible small airway disease. No evidence of acute intra-abdominal or pelvic abnormality. Marked atrophic R kidney, calcified uterine fibroid.   Past Medical History:  Diagnosis Date  . Hypertension     Past Surgical History:  Procedure Laterality Date  . APPENDECTOMY    . BREAST BIOPSY Right 2015   Fibroadenoma  . CHOLECYSTECTOMY    . TONSILLECTOMY      Current Outpatient Medications  Medication Sig Dispense Refill  . AIMOVIG 70 MG/ML SOAJ Inject 70 mg as directed every 30 (thirty) days.    . cholecalciferol (VITAMIN D3) 25 MCG (1000 UT) tablet Take 1,000 Units by mouth daily.    . cyclobenzaprine (FLEXERIL) 10 MG tablet Take 1 tablet by mouth at bedtime.    . isosorbide mononitrate (IMDUR) 30 MG 24 hr tablet Take 1 tablet (30 mg total) by mouth daily. 30 tablet 6  . losartan (COZAAR) 100 MG tablet Take 1 tablet (100 mg total) by mouth daily. 30 tablet 6   No current  facility-administered medications for this visit.   Allergies:  Procardia [nifedipine]   Social History: The patient  reports that she quit smoking about 10 months ago. Her smoking use included cigarettes. She smoked 0.50 packs per day. She has never used smokeless tobacco. She reports that she does not drink alcohol or use drugs.   Family History: The patient's family history includes Breast cancer (age of onset: 87) in her sister; Breast cancer (age of onset: 59) in her sister; Breast cancer (age of onset: 33) in her mother; Breast cancer (age of onset: 66) in her maternal grandmother.   ROS:  Please see the history of present illness. Otherwise, complete review of systems is positive for none.  All other systems are reviewed and negative.   Physical Exam: VS:  There were no vitals taken for this visit., BMI There is no height or weight on file to calculate BMI.  Wt Readings from Last 3 Encounters:  11/16/19 217 lb (98.4 kg)  08/06/19 220 lb 6.4 oz (100 kg)  05/10/19 218 lb 12.8 oz (99.2 kg)    General: Patient appears comfortable at rest. HEENT: Conjunctiva and lids normal, oropharynx clear with moist mucosa. Neck: Supple, no elevated JVP or carotid bruits, no thyromegaly. Lungs: Clear to auscultation, nonlabored breathing at rest. Cardiac: Regular rate and rhythm, no S3 or significant systolic murmur, no pericardial rub. Abdomen: Soft, nontender, no hepatomegaly, bowel sounds present, no guarding or rebound. Extremities: No pitting edema, distal pulses 2+.  Skin: Warm and dry. Musculoskeletal: No kyphosis. Neuropsychiatric: Alert and oriented x3, affect grossly appropriate.  ECG:  {EKG/Telemetry Strips Reviewed:[phone]}  Recent Labwork: No results found for requested labs within last 8760 hours.  No results found for: CHOL, TRIG, HDL, CHOLHDL, VLDL, LDLCALC, LDLDIRECT  Other Studies Reviewed Today:   Assessment and Plan:  1. Chest pain, unspecified type   2. Abnormal  EKG   3. Frequent PVCs   4. Cardiac arrhythmia, unspecified cardiac arrhythmia type   5. Other fatigue   6. Essential hypertension   7. Mitral valve insufficiency, unspecified etiology   8. Palpitations      Medication Adjustments/Labs and Tests Ordered: Current medicines are reviewed at length with the patient today.  Concerns regarding medicines are outlined above.   Disposition: Follow-up with ***  Signed, Levell July, NP 12/16/2019 12:13 AM    [facility] [place], [place], [place] [address] Phone: [phone]; Fax: [phone]

## 2019-12-16 ENCOUNTER — Ambulatory Visit: Payer: Medicaid Other | Admitting: Family Medicine

## 2019-12-24 ENCOUNTER — Other Ambulatory Visit: Payer: Self-pay

## 2019-12-24 ENCOUNTER — Encounter: Payer: Self-pay | Admitting: Family Medicine

## 2019-12-24 ENCOUNTER — Ambulatory Visit (INDEPENDENT_AMBULATORY_CARE_PROVIDER_SITE_OTHER): Payer: Medicaid Other | Admitting: Family Medicine

## 2019-12-24 VITALS — BP 124/72 | HR 57 | Ht 67.5 in | Wt 216.4 lb

## 2019-12-24 DIAGNOSIS — J449 Chronic obstructive pulmonary disease, unspecified: Secondary | ICD-10-CM

## 2019-12-24 DIAGNOSIS — R079 Chest pain, unspecified: Secondary | ICD-10-CM

## 2019-12-24 DIAGNOSIS — I10 Essential (primary) hypertension: Secondary | ICD-10-CM | POA: Diagnosis not present

## 2019-12-24 DIAGNOSIS — R1010 Upper abdominal pain, unspecified: Secondary | ICD-10-CM | POA: Diagnosis not present

## 2019-12-24 NOTE — Patient Instructions (Signed)
Medication Instructions:  Continue all current medications.  Labwork: none  Testing/Procedures: none  Follow-Up: 3 months   Any Other Special Instructions Will Be Listed Below (If Applicable).  If you need a refill on your cardiac medications before your next appointment, please call your pharmacy.  

## 2019-12-24 NOTE — Progress Notes (Addendum)
Cardiology Office Note  Date: 04/05/2020   ID: Anita Michael, DOB 10-20-1965, MRN [id number]  PCP:  Dara Lords, NP  Cardiologist:  No primary care provider on file. Electrophysiologist:  None   Chief Complaint: F/U chest pain, HTN  History of Present Illness: Anita Michael is a 54 y.o. female with a history of CP and HTN, COPD long term smoker who quit 6 months ago.  Last encounter with Dr. Bronson Ing via telemedicine on 11/16/2019. She was having episodes of retrosternal and upper central chest areas occurring once per week. The last episode had occurred 2 weeks prior lasting for 30 minutes. Imdur 30 mg was started. Losartan was increased to 100 mg daily d/t elevated BP.  Recent ED visit to [facility] 12/06/2019 for abdominal pain 3 days post screening colonoscopy. The evening after colonoscopy she began having upper abdominal pain, described as constant, sharp, severe. Worse with eating. No alleviating factors. Associated N/V. CT chest/ abdomen / pelvis : No PE, heterogeneous hazy bilateral lung densities suggesting possible small airway disease. No evidence of acute intra-abdominal or pelvic abnormality. Marked atrophic R kidney, calcified uterine fibroid.  Her work-up in the ER was negative for acute coronary syndrome was negative troponins x2.  She had a tubulovillous adenoma at 50 cm found on colonoscopy. Due to low oxygen saturations and increasing PVCs, the procedure had to be terminated before visualizing the cecum. The ileocecal valve could be seen but the scope was not advanced into the cecum before anesthesia asked the procedure to be terminated.  Gastroenterology is referring her to [facility] gastroenterology for a repeat colonoscopy within a year for follow-up of this polyp as well as visualization of the cecum.    Patient states she continues to have some chest pains which occur with and without exertion.  She has fibromyalgia and states she has pains at multiple areas  on her chest, back, shoulder, arms and she is unable to discern as to whether they are heart related or related to her fibromyalgia.  She denies any other symptoms such as nausea, vomiting, or diaphoresis.  No blood in her stool since the colonoscopy.  She does have polycythemia and is seeing hematology oncology for this issue.  She has a pending sleep study to evaluate for OSA.  She denies any strokelike symptoms, orthostatic symptoms, palpitations.  No claudication-like symptoms, DVT or PE-like symptoms, or lower extremity edema.  She does state when she eats sometimes she has epigastric pain and when eating food such as meat or potatoes the food seems like it may get stuck for short time.  She has never had an EGD in the past.  She denies any reflux symptoms.   Past Medical History:  Diagnosis Date   Fibromyalgia    Hypertension    Polycythemia     Past Surgical History:  Procedure Laterality Date   APPENDECTOMY     BREAST BIOPSY Right 2015   Fibroadenoma   CHOLECYSTECTOMY     TONSILLECTOMY      Current Outpatient Medications  Medication Sig Dispense Refill   AIMOVIG 70 MG/ML SOAJ Inject 70 mg as directed every 30 (thirty) days.     amitriptyline (ELAVIL) 50 MG tablet Take 50 mg by mouth at bedtime.     cholecalciferol (VITAMIN D3) 25 MCG (1000 UT) tablet Take 1,000 Units by mouth daily.     cyclobenzaprine (FLEXERIL) 10 MG tablet Take 1 tablet by mouth at bedtime.     isosorbide mononitrate (IMDUR) 30  MG 24 hr tablet Take 1 tablet (30 mg total) by mouth daily. 30 tablet 6   losartan (COZAAR) 100 MG tablet Take 1 tablet (100 mg total) by mouth daily. 30 tablet 6   albuterol (VENTOLIN HFA) 108 (90 Base) MCG/ACT inhaler Inhale 2 puffs into the lungs every 4 (four) hours as needed.     CHANTIX CONTINUING MONTH PAK 1 MG tablet Take by mouth as directed.     estradiol (ESTRACE) 0.1 MG/GM vaginal cream      nystatin (MYCOSTATIN/NYSTOP) powder      nystatin ointment  (MYCOSTATIN) APPLY TOPICALLY TOAAFFECTED AREA TWICE DAILY.     PREMARIN vaginal cream APPLY (0.5)MG VAGINALLY(THREE TIMES A WEEK FOR 2 WEEKS. THEN APPLY WEEKLY AS DIRECTED.     No current facility-administered medications for this visit.   Allergies:  Procardia [nifedipine], Latex, and Other   Social History: The patient  reports that she quit smoking about 13 months ago. Her smoking use included cigarettes. She smoked 0.50 packs per day. She has never used smokeless tobacco. She reports that she does not drink alcohol and does not use drugs.   Family History: The patient's family history includes Breast cancer (age of onset: 75) in her sister; Breast cancer (age of onset: 36) in her sister; Breast cancer (age of onset: 55) in her mother; Breast cancer (age of onset: 43) in her maternal grandmother.   ROS:  Please see the history of present illness. Otherwise, complete review of systems is positive for none.  All other systems are reviewed and negative.   Physical Exam: VS:  BP 124/72    Pulse (!) 57    Ht 5' 7.5" (1.715 m)    Wt 216 lb 6.4 oz (98.2 kg)    SpO2 95%    BMI 33.39 kg/m , BMI Body mass index is 33.39 kg/m.  Wt Readings from Last 3 Encounters:  03/16/20 210 lb (95.3 kg)  02/10/20 210 lb (95.3 kg)  12/24/19 216 lb 6.4 oz (98.2 kg)    General: Patient appears comfortable at rest. Neck: Supple, no elevated JVP or carotid bruits, no thyromegaly. Lungs: Clear to auscultation, nonlabored breathing at rest. Cardiac: Regular rate and rhythm, no S3 or significant systolic murmur, no pericardial rub. Extremities: No pitting edema, distal pulses 2+. Skin: Warm and dry. Musculoskeletal: No kyphosis. Neuropsychiatric: Alert and oriented x3, affect grossly appropriate.  ECG: EKG at [facility] on 12/07/2019 showed sinus rhythm with right bundle branch block, nonspecific changes and ST segment.  Rate of 65  Recent Labwork: No results found for requested labs within last 8760 hours.   No results found for: CHOL, TRIG, HDL, CHOLHDL, VLDL, LDLCALC, LDLDIRECT  Other Studies Reviewed Today:  Nuclear stress test 06/28/2019   Resting T wave inversions in leads II, aVF, and V3-6.  Defect 1: There is a medium defect of mild severity present in the mid anteroseptal, mid inferoseptal and mid inferolateral location. The anterosepal defect may be due to conduction defect. A small area of scar cannot entirely be ruled out.  This is a low risk study. No large ischemic territories.  Nuclear stress EF: 51%.     Echocardiogram 06/03/2019  1. Left ventricular ejection fraction, by visual estimation, is 50 to 55%. The left ventricle has normal function. There is mildly increased left ventricular hypertrophy. 2. Left ventricular diastolic parameters are consistent with Grade II diastolic dysfunction (pseudonormalization). 3. Global right ventricle has low normal systolic function.The right ventricular size is normal. No increase in  right ventricular wall thickness. 4. Left atrial size was normal. 5. Right atrial size was normal. 6. The mitral valve is grossly normal. Mild to moderate mitral valve regurgitation. 7. The tricuspid valve is grossly normal. Tricuspid valve regurgitation is trivial. 8. The aortic valve is tricuspid. Aortic valve regurgitation is not visualized. No evidence of aortic valve sclerosis or stenosis. 9. The pulmonic valve was grossly normal. Pulmonic valve regurgitation is not visualized. 10. Normal pulmonary artery systolic pressure. 11. The inferior vena cava is normal in size with greater than 50% respiratory variability, suggesting right atrial pressure of 3 mmHg.   Assessment and Plan:  1. Chest pain, unspecified type   2. Essential hypertension   3. Pain of upper abdomen   4. Chronic obstructive pulmonary disease, unspecified COPD type ([place])     1. Chest pain, unspecified type Recent episodes of chest pain after colonoscopy.  She was treated  and released from [facility] after work-up for ACS.  She was negative for ACS.  CT showed no evidence of PE.  She continues to have chest pain on and off with and without activity.  She is currently taking Imdur 30 mg daily for chest pain.  She states there is no predictable pattern to the chest pain states it can occur with activity, without activity.  States she is unable to discern as to whether the chest pain is a result of her fibromyalgia or another cause.  Continue Imdur 30 mg.  2. Essential hypertension Blood pressure well controlled on current therapy continue losartan 100 mg daily.  3. Pain of upper abdomen Continues to have chest pain/epigastric pain and can sometimes be associated with eating larger meals.  Also states when attempting to swallow sometimes she feels the food gets stuck on occasion.  Advised her to follow-up with her GI specialist for possible EGD.  4. Chronic obstructive pulmonary disease, unspecified COPD type ([place]) Denies any significant exertional dyspnea.  States she stopped smoking approximately 6 months ago.  Patient is seen in hematology oncology for polycythemia.  She was scheduled for sleep study but missed the appointment by 5 minutes and needed to be rescheduled.  Study was ordered to check for OSA and if it may be contributing to the  polycythemia.    Medication Adjustments/Labs and Tests Ordered: Current medicines are reviewed at length with the patient today.  Concerns regarding medicines are outlined above.   Disposition: Follow-up with Dr. Bronson Ing or APP Signed, Levell July, NP 04/05/2020 8:29 PM    [facility] at [place], [place], [place] [address] Phone: [phone]; Fax: [phone]

## 2020-01-04 ENCOUNTER — Ambulatory Visit (HOSPITAL_COMMUNITY)
Admission: RE | Admit: 2020-01-04 | Discharge: 2020-01-04 | Disposition: A | Discharge status: Home / Self Care | Payer: Medicaid Other | Source: Ambulatory Visit | Attending: General Practice | Admitting: General Practice

## 2020-01-04 ENCOUNTER — Ambulatory Visit (HOSPITAL_COMMUNITY): Admission: RE | Admit: 2020-01-04 | Payer: Medicaid Other | Source: Ambulatory Visit

## 2020-01-04 ENCOUNTER — Other Ambulatory Visit: Payer: Self-pay

## 2020-01-04 DIAGNOSIS — N6320 Unspecified lump in the left breast, unspecified quadrant: Secondary | ICD-10-CM

## 2020-01-06 ENCOUNTER — Other Ambulatory Visit (HOSPITAL_BASED_OUTPATIENT_CLINIC_OR_DEPARTMENT_OTHER): Payer: Self-pay

## 2020-01-06 DIAGNOSIS — E669 Obesity, unspecified: Secondary | ICD-10-CM

## 2020-01-06 DIAGNOSIS — G478 Other sleep disorders: Secondary | ICD-10-CM

## 2020-01-06 DIAGNOSIS — D751 Secondary polycythemia: Secondary | ICD-10-CM

## 2020-02-01 ENCOUNTER — Other Ambulatory Visit (HOSPITAL_COMMUNITY): Payer: Medicaid Other

## 2020-02-03 ENCOUNTER — Other Ambulatory Visit: Payer: Self-pay

## 2020-02-03 ENCOUNTER — Ambulatory Visit: Payer: Medicaid Other | Attending: Internal Medicine | Admitting: Internal Medicine

## 2020-02-03 DIAGNOSIS — E669 Obesity, unspecified: Secondary | ICD-10-CM | POA: Diagnosis not present

## 2020-02-03 DIAGNOSIS — R0902 Hypoxemia: Secondary | ICD-10-CM | POA: Diagnosis not present

## 2020-02-03 DIAGNOSIS — G4733 Obstructive sleep apnea (adult) (pediatric): Secondary | ICD-10-CM | POA: Insufficient documentation

## 2020-02-03 DIAGNOSIS — R400 Somnolence: Secondary | ICD-10-CM | POA: Insufficient documentation

## 2020-02-03 DIAGNOSIS — D751 Secondary polycythemia: Secondary | ICD-10-CM | POA: Diagnosis not present

## 2020-02-03 DIAGNOSIS — G478 Other sleep disorders: Secondary | ICD-10-CM

## 2020-02-06 NOTE — Procedures (Signed)
   NAME: Anita Michael DATE OF BIRTH:  01/16/1966 MEDICAL RECORD NUMBER [id number]  LOCATION: [facility]  PHYSICIAN: Marius Ditch  DATE OF STUDY: 02/03/2020  SLEEP STUDY TYPE: Nocturnal Polysomnogram               REFERRING PHYSICIAN: Marius Ditch, MD  INDICATION FOR STUDY: polycythemia with no obvious cause  EPWORTH SLEEPINESS SCORE:   HEIGHT:    WEIGHT:      There is no height or weight on file to calculate BMI.  NECK SIZE: 16 in.  MEDICATIONS Patient self administered medications include: N/A. Medications administered during study include No sleep medicine administered.[name]  SLEEP STUDY TECHNIQUE A multi-channel overnight Polysomnography study was performed. The channels recorded and monitored were central and occipital EEG, electrooculogram (EOG), submentalis EMG (chin), nasal and oral airflow, thoracic and abdominal wall motion, anterior tibialis EMG, snore microphone, electrocardiogram, and a pulse oximetry.  TECHNICAL COMMENTS Comments added by Technician: Patient had one bathroom break during study time. Comments added by Scorer: N/A  SLEEP ARCHITECTURE The study was initiated at 9:59:45 PM and terminated at 4:12:39 AM. The total recorded time was 372.9 minutes. EEG confirmed total sleep time was 257.5 minutes yielding a sleep efficiency of 69.1%%. Sleep onset after lights out was 20.9 minutes with a REM latency of 267.5 minutes. The patient spent 16.3%% of the night in stage N1 sleep, 71.1%% in stage N2 sleep, 3.7%% in stage N3 and 8.9% in REM. Wake after sleep onset (WASO) was 94.5 minutes. The Arousal Index was 21.9/hour.  RESPIRATORY PARAMETERS There were a total of 163 respiratory disturbances out of which 38 were apneas ( 37 obstructive, 0 mixed, 1 central) and 125 hypopneas. The apnea/hypopnea index (AHI) was 38.0 events/hour. The central sleep apnea index was 0.2 events/hour. The REM AHI was 75.7 events/hour and NREM AHI was 34.3 events/hour.  The patient did not sleep supine during the test. Respiratory disturbances were associated with oxygen desaturation down to a nadir of 72.0% during sleep. The mean oxygen saturation during the study was 89.4%. The cumulative time under 88% oxygen saturation was 82 minutes. Mean O2 sat was 89.4%  LEG MOVEMENT DATA The total leg movements were 0 with a resulting leg movement index of 0.0/hr . Associated arousal with leg movement index was 0.0/hr.  CARDIAC DATA The underlying cardiac rhythm was most consistent with sinus rhythm. Mean heart rate during sleep was 70.0 bpm. Additional rhythm abnormalities include PVCs.  IMPRESSIONS - Severe Obstructive Sleep apnea(OSA) - No Significant Central Sleep Apnea (CSA) - Severe Oxygen Desaturation - No significant periodic leg movements(PLMs) during sleep.   DIAGNOSIS - Obstructive Sleep Apnea (G47.33) - Nocturnal Hypoxemia (G47.36)  RECOMMENDATIONS - CPAP should be started. This could be done with use of APAP or with CPAP titration in the lab. - Polycythemia is generally associated with low mean O2 sats. Her mean O2 sat is only slightly low. Recommend checking Hb on CPAP to see if polycythemia corrects.   Marius Ditch Sleep specialist, [place] [employer]  ELECTRONICALLY SIGNED ON:  02/06/2020, 7:33 PM [place] PH: [phone]   FX: [phone] [place]

## 2020-02-10 ENCOUNTER — Encounter: Payer: Self-pay | Admitting: Urology

## 2020-02-10 ENCOUNTER — Other Ambulatory Visit: Payer: Self-pay

## 2020-02-10 ENCOUNTER — Ambulatory Visit (INDEPENDENT_AMBULATORY_CARE_PROVIDER_SITE_OTHER): Payer: Medicaid Other | Admitting: Urology

## 2020-02-10 VITALS — BP 139/81 | HR 76 | Temp 96.4°F | Ht 68.0 in | Wt 210.0 lb

## 2020-02-10 DIAGNOSIS — N2889 Other specified disorders of kidney and ureter: Secondary | ICD-10-CM | POA: Diagnosis not present

## 2020-02-10 DIAGNOSIS — N281 Cyst of kidney, acquired: Secondary | ICD-10-CM

## 2020-02-10 NOTE — Progress Notes (Signed)
02/10/2020 9:44 AM   Anita Michael 1966/05/22 [id number]  Referring provider: Wyatt Haste, NP [place],  [place] [address]  Right renal mass  HPI: Anita Michael is a 54yo here for evaluation of a right renal mass found on workup for abdominal pain. The CT from [facility] showed a complex right 4cm mid pole cyst. NO hx of gross hematuria. No significant flank pain. No family hx of renal masses. This is the first she has known of her renal cyst. No significant LUTS.   PMH: Past Medical History:  Diagnosis Date  . Fibromyalgia   . Hypertension   . Polycythemia     Surgical History: Past Surgical History:  Procedure Laterality Date  . APPENDECTOMY    . BREAST BIOPSY Right 2015   Fibroadenoma  . CHOLECYSTECTOMY    . TONSILLECTOMY      Home Medications:  Allergies as of 02/10/2020      Reactions   Procardia [nifedipine] Other (See Comments)   Heart races   Latex    Other Rash      Medication List       Accurate as of February 10, 2020  9:44 AM. If you have any questions, ask your nurse or doctor.        Aimovig 70 MG/ML Soaj Generic drug: Erenumab-aooe Inject 70 mg as directed every 30 (thirty) days.   albuterol 108 (90 Base) MCG/ACT inhaler Commonly known as: VENTOLIN HFA Inhale 2 puffs into the lungs every 4 (four) hours as needed.   amitriptyline 50 MG tablet Commonly known as: ELAVIL Take 50 mg by mouth at bedtime.   Chantix Continuing Month Pak 1 MG tablet Generic drug: varenicline Take by mouth as directed.   cholecalciferol 25 MCG (1000 UNIT) tablet Commonly known as: VITAMIN D3 Take 1,000 Units by mouth daily.   cyclobenzaprine 10 MG tablet Commonly known as: FLEXERIL Take 1 tablet by mouth at bedtime.   estradiol 0.1 MG/GM vaginal cream Commonly known as: ESTRACE   isosorbide mononitrate 30 MG 24 hr tablet Commonly known as: IMDUR Take 1 tablet (30 mg total) by mouth daily.   losartan 100 MG tablet Commonly  known as: COZAAR Take 1 tablet (100 mg total) by mouth daily.   nystatin ointment Commonly known as: MYCOSTATIN APPLY TOPICALLY TOAAFFECTED AREA TWICE DAILY.   nystatin powder Commonly known as: MYCOSTATIN/NYSTOP   Premarin vaginal cream Generic drug: conjugated estrogens APPLY (0.5)MG VAGINALLY(THREE TIMES A WEEK FOR 2 WEEKS. THEN APPLY WEEKLY AS DIRECTED.       Allergies:  Allergies  Allergen Reactions  . Procardia [Nifedipine] Other (See Comments)    Heart races  . Latex   . Other Rash    Family History: Family History  Problem Relation Age of Onset  . Breast cancer Mother 88  . Breast cancer Sister 36  . Breast cancer Maternal Grandmother 38  . Breast cancer Sister 10    Social History:  reports that she quit smoking about a year ago. Her smoking use included cigarettes. She smoked 0.50 packs per day. She has never used smokeless tobacco. She reports that she does not drink alcohol and does not use drugs.  ROS: All other review of systems were reviewed and are negative except what is noted above in HPI  Physical Exam: BP 139/81   Pulse 76   Temp (!) 96.4 F (35.8 C)   Ht 5\' 8"  (1.727 m)   Wt 210 lb (95.3 kg)  BMI 31.93 kg/m   Constitutional:  Alert and oriented, No acute distress. HEENT: [place] AT, moist mucus membranes.  Trachea midline, no masses. Cardiovascular: No clubbing, cyanosis, or edema. Respiratory: Normal respiratory effort, no increased work of breathing. GI: Abdomen is soft, nontender, nondistended, no abdominal masses GU: No CVA tenderness.  Lymph: No cervical or inguinal lymphadenopathy. Skin: No rashes, bruises or suspicious lesions. Neurologic: Grossly intact, no focal deficits, moving all 4 extremities. Psychiatric: Normal mood and affect.  Laboratory Data: No results found for: WBC, HGB, HCT, MCV, PLT  No results found for: CREATININE  No results found for: PSA  No results found for: TESTOSTERONE  No results found for:  HGBA1C  Urinalysis No results found for: COLORURINE, APPEARANCEUR, LABSPEC, PHURINE, GLUCOSEU, HGBUR, BILIRUBINUR, KETONESUR, PROTEINUR, UROBILINOGEN, NITRITE, LEUKOCYTESUR  No results found for: LABMICR, [place], RBCUA, LABEPIT, MUCUS, BACTERIA  Pertinent Imaging: CT abd/pelvis w contrast 11/2019: Images reviewed and discussed with the patient No results found for this or any previous visit.  No results found for this or any previous visit.  No results found for this or any previous visit.  No results found for this or any previous visit.  No results found for this or any previous visit.  No results found for this or any previous visit.  No results found for this or any previous visit.  No results found for this or any previous visit.   Assessment & Plan:    1. Kidney cysts -We discussed the natural hx of renal masses and the workup including MRI imaging for complex renal cyst.  We will obtain MRI abd.  - POCT urinalysis dipstick   No follow-ups on file.  Nicolette Bang, MD  [facility] [place]

## 2020-02-10 NOTE — Progress Notes (Signed)
,  Urological Symptom Review  Patient is experiencing the following symptoms: Get up at night to urinate Painful intercourse   Review of Systems  Gastrointestinal (upper)  : Negative for upper GI symptoms  Gastrointestinal (lower) : Negative for lower GI symptoms  Constitutional : Negative for symptoms  Skin: Negative for skin symptoms  Eyes: Negative for eye symptoms  Ear/Nose/Throat : Negative for Ear/Nose/Throat symptoms  Hematologic/Lymphatic: Negative for Hematologic/Lymphatic symptoms  Cardiovascular : Chest pain  Respiratory : Negative for respiratory symptoms  Endocrine: Negative for endocrine symptoms  Musculoskeletal: Joint and muscle pain   Neurological: Headaches  Psychologic: Negative for psychiatric symptoms

## 2020-02-10 NOTE — Patient Instructions (Signed)
Renal Mass  A renal mass is a growth in the kidney. A renal mass may be found while performing an MRI, CT scan, or ultrasound for other problems of the abdomen. Certain types of cancers, infections, or injuries can cause a renal mass. A renal mass that is cancerous (malignant) may grow or spread quickly. Others are harmless (benign). What are common types of renal masses? Renal masses include:  Tumors. These may be cancerous (malignant) or noncancerous (benign). ? The most common type of kidney cancer is renal cell carcinoma. ? The most common benign tumors of the kidney include renal adenomas, oncocytomas, and angiomyolipoma (AML).  Cysts. These are fluid-filled sacs that form on or in the kidney. ? It is not always known what causes a cyst to develop in or on the kidney. ? Most kidney cysts do not cause symptoms and do not need to be treated. What type of testing might I need? Your health care provider may recommend that you have tests to diagnose the cause of your renal mass. The following tests may be done if a renal mass is found:  Physical exam.  Blood tests.  Urine tests.  Imaging tests, such as ultrasound, CT scan, or MRI.  Biopsy. This is a small sample that is removed from the renal mass and tested in a lab. The exact tests and how often they are done will depend on:  The size and appearance of the renal mass.  Risk factors or medical conditions that increase your risk for problems.  Any symptoms associated with the renal mass, or concerns that you have about it. Tests and physical exams may be done once, or they may be done regularly for a period of time. Tests and exams that are done regularly will help monitor whether the mass is growing and beginning to cause problems. What are common treatments for renal masses? Treatment is not always needed for this condition. Your health care provider may recommend careful monitoring (watchful waiting) and regular tests and exams.  Treatment will depend on the cause of the mass. Follow these instructions at home: What you need to do at home will depend on the cause of the mass. Follow the instructions that your health care provider gives to you. In general:  Take over-the-counter and prescription medicines only as told by your health care provider.  If you are prescribed an antibiotic medicine, take it as told by your health care provider. Do not stop taking the antibiotic even if you start to feel better.  Follow any restrictions that are given to you by your health care provider.  Keep all follow-up visits as told by your health care provider. This is important. ? You may need to see your health care provider once or twice a year to have CT scans and ultrasounds done. These tests will show if your renal mass has changed or grown bigger. Contact a health care provider if you:  Have pain in the side or back (flank pain).  Have a fever.  Feel full soon after eating.  Have pain or swelling in the abdomen.  Lose weight. Get help right away if:  Your pain gets worse.  There is blood in your urine.  You cannot urinate.  You have chest pain.  You have trouble breathing. Summary  A renal mass is a growth in the kidney. It may be cancerous (malignant) and grow or spread quickly, or it may be harmless (benign).  Renal masses may be found while performing   an MRI, CT scan, or ultrasound for other problems of the abdomen.  Your health care provider may recommend that you have tests to diagnose the cause of your renal mass. This may include a physical exam, blood tests, urine tests, imaging, or a biopsy.  Treatment is not always needed for this condition. Careful monitoring (watchful waiting) may be recommended. This information is not intended to replace advice given to you by your health care provider. Make sure you discuss any questions you have with your health care provider. Document Revised: 08/14/2017  Document Reviewed: 08/14/2017 Elsevier Patient Education  2020 Elsevier Inc.  

## 2020-02-16 ENCOUNTER — Telehealth: Payer: Medicaid Other | Admitting: Cardiovascular Disease

## 2020-03-13 ENCOUNTER — Other Ambulatory Visit: Payer: Self-pay

## 2020-03-13 ENCOUNTER — Ambulatory Visit (HOSPITAL_COMMUNITY)
Admission: RE | Admit: 2020-03-13 | Discharge: 2020-03-13 | Disposition: A | Discharge status: Home / Self Care | Payer: Medicaid Other | Source: Ambulatory Visit | Attending: Urology | Admitting: Urology

## 2020-03-13 DIAGNOSIS — N2889 Other specified disorders of kidney and ureter: Secondary | ICD-10-CM | POA: Diagnosis present

## 2020-03-13 MED ORDER — GADOBUTROL 1 MMOL/ML IV SOLN
10.0000 mL | Freq: Once | INTRAVENOUS | Status: AC | PRN
  Administered 2020-03-13: 10 mL via INTRAVENOUS

## 2020-03-14 NOTE — Progress Notes (Signed)
Mychart message sent.

## 2020-03-16 ENCOUNTER — Encounter: Payer: Self-pay | Admitting: Urology

## 2020-03-16 ENCOUNTER — Ambulatory Visit (INDEPENDENT_AMBULATORY_CARE_PROVIDER_SITE_OTHER): Payer: Medicaid Other | Admitting: Urology

## 2020-03-16 ENCOUNTER — Other Ambulatory Visit: Payer: Self-pay

## 2020-03-16 VITALS — BP 108/71 | HR 54 | Temp 97.8°F | Ht 68.0 in | Wt 210.0 lb

## 2020-03-16 DIAGNOSIS — N281 Cyst of kidney, acquired: Secondary | ICD-10-CM | POA: Diagnosis not present

## 2020-03-16 NOTE — Progress Notes (Signed)
03/16/2020 10:52 AM   Christoper Fabian 23-Oct-1965 [id number]  Referring provider: Dara Lords, NP [address] [place],  [place] [address]  Right renal cyst  HPI: Ms Skoda is a 54yo here for followup for a right complex renal cyst. She underwent MRI of the abd 03/13/2020 which showed a 4.3cm simple right renal cyst. No evidence of malignancy, She denies any flank pain. No gross hematuria.    PMH: Past Medical History:  Diagnosis Date  . Fibromyalgia   . Hypertension   . Polycythemia     Surgical History: Past Surgical History:  Procedure Laterality Date  . APPENDECTOMY    . BREAST BIOPSY Right 2015   Fibroadenoma  . CHOLECYSTECTOMY    . TONSILLECTOMY      Home Medications:  Allergies as of 03/16/2020      Reactions   Procardia [nifedipine] Other (See Comments)   Heart races   Latex    Other Rash      Medication List       Accurate as of March 16, 2020 10:52 AM. If you have any questions, ask your nurse or doctor.        Aimovig 70 MG/ML Soaj Generic drug: Erenumab-aooe Inject 70 mg as directed every 30 (thirty) days.   albuterol 108 (90 Base) MCG/ACT inhaler Commonly known as: VENTOLIN HFA Inhale 2 puffs into the lungs every 4 (four) hours as needed.   amitriptyline 50 MG tablet Commonly known as: ELAVIL Take 50 mg by mouth at bedtime.   Chantix Continuing Month Pak 1 MG tablet Generic drug: varenicline Take by mouth as directed.   cholecalciferol 25 MCG (1000 UNIT) tablet Commonly known as: VITAMIN D3 Take 1,000 Units by mouth daily.   cyclobenzaprine 10 MG tablet Commonly known as: FLEXERIL Take 1 tablet by mouth at bedtime.   estradiol 0.1 MG/GM vaginal cream Commonly known as: ESTRACE   isosorbide mononitrate 30 MG 24 hr tablet Commonly known as: IMDUR Take 1 tablet (30 mg total) by mouth daily.   losartan 100 MG tablet Commonly known as: COZAAR Take 1 tablet (100 mg total) by mouth daily.   nystatin  ointment Commonly known as: MYCOSTATIN APPLY TOPICALLY TOAAFFECTED AREA TWICE DAILY.   nystatin powder Commonly known as: MYCOSTATIN/NYSTOP   Premarin vaginal cream Generic drug: conjugated estrogens APPLY (0.5)MG VAGINALLY(THREE TIMES A WEEK FOR 2 WEEKS. THEN APPLY WEEKLY AS DIRECTED.       Allergies:  Allergies  Allergen Reactions  . Procardia [Nifedipine] Other (See Comments)    Heart races  . Latex   . Other Rash    Family History: Family History  Problem Relation Age of Onset  . Breast cancer Mother 82  . Breast cancer Sister 25  . Breast cancer Maternal Grandmother 5  . Breast cancer Sister 71    Social History:  reports that she quit smoking about 13 months ago. Her smoking use included cigarettes. She smoked 0.50 packs per day. She has never used smokeless tobacco. She reports that she does not drink alcohol and does not use drugs.  ROS: All other review of systems were reviewed and are negative except what is noted above in HPI  Physical Exam: Ht 5\' 8"  (1.727 m)   Wt 210 lb (95.3 kg)   BMI 31.93 kg/m   Constitutional:  Alert and oriented, No acute distress. HEENT: [place] AT, moist mucus membranes.  Trachea midline, no masses. Cardiovascular: No clubbing, cyanosis, or edema. Respiratory: Normal respiratory effort, no increased  work of breathing. GI: Abdomen is soft, nontender, nondistended, no abdominal masses GU: No CVA tenderness.  Lymph: No cervical or inguinal lymphadenopathy. Skin: No rashes, bruises or suspicious lesions. Neurologic: Grossly intact, no focal deficits, moving all 4 extremities. Psychiatric: Normal mood and affect.  Laboratory Data: No results found for: WBC, HGB, HCT, MCV, PLT  No results found for: CREATININE  No results found for: PSA  No results found for: TESTOSTERONE  No results found for: HGBA1C  Urinalysis No results found for: COLORURINE, APPEARANCEUR, LABSPEC, PHURINE, GLUCOSEU, HGBUR, BILIRUBINUR, KETONESUR,  PROTEINUR, UROBILINOGEN, NITRITE, LEUKOCYTESUR  No results found for: LABMICR, [place], RBCUA, LABEPIT, MUCUS, BACTERIA  Pertinent Imaging: MRI abd 03/13/2020: Images reviewed and discussed with the patient No results found for this or any previous visit.  No results found for this or any previous visit.  No results found for this or any previous visit.  No results found for this or any previous visit.  No results found for this or any previous visit.  No results found for this or any previous visit.  No results found for this or any previous visit.  No results found for this or any previous visit.   Assessment & Plan:    1. Kidney cysts -We discussed the benign nature of these cysts. I will see her back in 1 year with a renal US - Urinalysis, Routine w reflex microscopic   No follow-ups on file.  Nicolette Bang, MD  [facility] [place]

## 2020-03-16 NOTE — Progress Notes (Signed)
Urological Symptom Review  Patient is experiencing the following symptoms: Get up at night to urinate   Review of Systems  Gastrointestinal (upper)  : Negative for upper GI symptoms  Gastrointestinal (lower) : Negative for lower GI symptoms  Constitutional : Negative for symptoms  Skin: Negative for skin symptoms  Eyes: Negative for eye symptoms  Ear/Nose/Throat : Negative for Ear/Nose/Throat symptoms  Hematologic/Lymphatic: Negative for Hematologic/Lymphatic symptoms  Cardiovascular : Negative for cardiovascular symptoms  Respiratory : Negative for respiratory symptoms  Endocrine: Negative for endocrine symptoms  Musculoskeletal: Negative for musculoskeletal symptoms  Neurological: Headaches  Psychologic: Negative for psychiatric symptoms  

## 2020-03-28 ENCOUNTER — Ambulatory Visit
Admission: EM | Admit: 2020-03-28 | Discharge: 2020-03-28 | Disposition: A | Discharge status: Home / Self Care | Payer: Medicaid Other | Attending: Emergency Medicine | Admitting: Emergency Medicine

## 2020-03-28 ENCOUNTER — Other Ambulatory Visit: Payer: Self-pay

## 2020-03-28 DIAGNOSIS — Z1152 Encounter for screening for COVID-19: Secondary | ICD-10-CM

## 2020-03-28 DIAGNOSIS — G4733 Obstructive sleep apnea (adult) (pediatric): Secondary | ICD-10-CM | POA: Insufficient documentation

## 2020-03-28 NOTE — ED Triage Notes (Signed)
Patient had an exposure, no symptoms just wants testing

## 2020-03-29 ENCOUNTER — Other Ambulatory Visit: Payer: Self-pay

## 2020-03-30 ENCOUNTER — Ambulatory Visit: Payer: Medicaid Other | Admitting: Family Medicine

## 2020-03-30 ENCOUNTER — Ambulatory Visit: Payer: Medicaid Other | Admitting: Cardiovascular Disease

## 2020-03-30 LAB — SARS-COV-2, NAA 2 DAY TAT

## 2020-03-30 LAB — NOVEL CORONAVIRUS, NAA: SARS-CoV-2, NAA: NOT DETECTED

## 2020-04-05 ENCOUNTER — Encounter: Payer: Self-pay | Admitting: *Deleted

## 2020-04-05 NOTE — Progress Notes (Signed)
Cardiology Office Note  Date: 04/06/2020   ID: Anita Michael, DOB 29-Oct-1965, MRN [id number]  PCP:  Dara Lords, NP  Cardiologist:  No primary care provider on file. Electrophysiologist:  None   Chief Complaint: F/U chest pain, HTN  History of Present Illness: Anita Michael is a 54 y.o. female with a history of CP and HTN, COPD long term smoker who quit 6 months ago.  Last encounter with Dr. Bronson Ing via telemedicine on 11/16/2019. She was having episodes of retrosternal and upper central chest areas occurring once per week. The last episode had occurred 2 weeks prior lasting for 30 minutes. Imdur 30 mg was started. Losartan was increased to 100 mg daily d/t elevated BP.  Recent ED visit to [facility] 12/06/2019 for abdominal pain 3 days post screening colonoscopy. The evening after colonoscopy she began having upper abdominal pain, described as constant, sharp, severe. Worse with eating. No alleviating factors. Associated N/V. CT chest/ abdomen / pelvis : No PE, heterogeneous hazy bilateral lung densities suggesting possible small airway disease. No evidence of acute intra-abdominal or pelvic abnormality. Marked atrophic R kidney, calcified uterine fibroid.  Her work-up in the ER was negative for acute coronary syndrome was negative troponins x2.  She had a tubulovillous adenoma at 50 cm found on colonoscopy. Due to low oxygen saturations and increasing PVCs, the procedure had to be terminated before visualizing the cecum. The ileocecal valve could be seen but the scope was not advanced into the cecum before anesthesia asked the procedure to be terminated.  Gastroenterology is referring her to [facility] gastroenterology for a repeat colonoscopy within a year for follow-up of this polyp as well as visualization of the cecum.    Patient states she continues to have some chest pains which occur with and without exertion.  She has fibromyalgia and states she has pains at multiple areas  on her chest, back, shoulder, arms and she is unable to discern as to whether they are heart related or related to her fibromyalgia.  She denies any other symptoms such as nausea, vomiting, or diaphoresis.  No blood in her stool since the colonoscopy.  She does have polycythemia and is seeing hematology oncology for this issue. Recently had a sleep study which was positive for obstructive sleep apnea. States she's been having trouble obtaining the CPAP device due to insurance issues. Her hematologist believed her polycythemia was related to obstructive sleep apnea.. At last visit  she stated when she ate sometimes she had epigastric pain and when eating food such as meat or potatoes the food seemed like it may get stuck for short time.  She has never had an EGD in the past.  She denied any reflux symptoms. Advised her at last visit to follow-up with GI due to her epigastric symptoms. I do not see in the record where she has followed up with GI   Past Medical History:  Diagnosis Date  . Fibromyalgia   . Hypertension   . Polycythemia     Past Surgical History:  Procedure Laterality Date  . APPENDECTOMY    . BREAST BIOPSY Right 2015   Fibroadenoma  . CHOLECYSTECTOMY    . TONSILLECTOMY      Current Outpatient Medications  Medication Sig Dispense Refill  . AIMOVIG 70 MG/ML SOAJ Inject 70 mg as directed every 30 (thirty) days.    [name] amitriptyline (ELAVIL) 50 MG tablet Take 50 mg by mouth at bedtime.    . cholecalciferol (VITAMIN D3) 25  MCG (1000 UT) tablet Take 1,000 Units by mouth daily.    . cyclobenzaprine (FLEXERIL) 10 MG tablet Take 1 tablet by mouth at bedtime.    [name] estradiol (ESTRACE) 0.1 MG/GM vaginal cream     . isosorbide mononitrate (IMDUR) 30 MG 24 hr tablet Take 1 tablet (30 mg total) by mouth daily. 30 tablet 6  . losartan (COZAAR) 100 MG tablet Take 1 tablet (100 mg total) by mouth daily. 30 tablet 6  . nystatin (MYCOSTATIN/NYSTOP) powder as needed.     . nystatin ointment  (MYCOSTATIN) APPLY TOPICALLY TOAAFFECTED AREA TWICE DAILY.    [name] PREMARIN vaginal cream APPLY (0.5)MG VAGINALLY(THREE TIMES A WEEK FOR 2 WEEKS. THEN APPLY WEEKLY AS DIRECTED.     No current facility-administered medications for this visit.   Allergies:  Procardia [nifedipine], Latex, and Other   Social History: The patient  reports that she quit smoking about 13 months ago. Her smoking use included cigarettes. She smoked 0.50 packs per day. She has never used smokeless tobacco. She reports that she does not drink alcohol and does not use drugs.   Family History: The patient's family history includes Breast cancer (age of onset: 19) in her sister; Breast cancer (age of onset: 34) in her sister; Breast cancer (age of onset: 13) in her mother; Breast cancer (age of onset: 72) in her maternal grandmother.   ROS:  Please see the history of present illness. Otherwise, complete review of systems is positive for none.  All other systems are reviewed and negative.   Physical Exam: VS:  BP 114/70   Pulse 67   Ht 5' 7.5" (1.715 m)   Wt 224 lb 6.4 oz (101.8 kg)   SpO2 97%   BMI 34.63 kg/m , BMI Body mass index is 34.63 kg/m.  Wt Readings from Last 3 Encounters:  04/06/20 224 lb 6.4 oz (101.8 kg)  03/16/20 210 lb (95.3 kg)  02/10/20 210 lb (95.3 kg)    General: Patient appears comfortable at rest. Neck: Supple, no elevated JVP or carotid bruits, no thyromegaly. Lungs: Clear to auscultation, nonlabored breathing at rest. Cardiac: Regular rate and rhythm, no S3 or significant systolic murmur, no pericardial rub. Extremities: No pitting edema, distal pulses 2+. Skin: Warm and dry. Musculoskeletal: No kyphosis. Neuropsychiatric: Alert and oriented x3, affect grossly appropriate.  ECG: EKG at [facility] on 12/07/2019 showed sinus rhythm with right bundle branch block, nonspecific changes and ST segment.  Rate of 65  Recent Labwork: No results found for requested labs within last 8760 hours.    No results found for: CHOL, TRIG, HDL, CHOLHDL, VLDL, LDLCALC, LDLDIRECT  Other Studies Reviewed Today:  Nuclear stress test 06/28/2019   Resting T wave inversions in leads II, aVF, and V3-6.  Defect 1: There is a medium defect of mild severity present in the mid anteroseptal, mid inferoseptal and mid inferolateral location. The anterosepal defect may be due to conduction defect. A small area of scar cannot entirely be ruled out.  This is a low risk study. No large ischemic territories.  Nuclear stress EF: 51%.     Echocardiogram 06/03/2019  1. Left ventricular ejection fraction, by visual estimation, is 50 to 55%. The left ventricle has normal function. There is mildly increased left ventricular hypertrophy. 2. Left ventricular diastolic parameters are consistent with Grade II diastolic dysfunction (pseudonormalization). 3. Global right ventricle has low normal systolic function.The right ventricular size is normal. No increase in right ventricular wall thickness. 4. Left atrial size was  normal. 5. Right atrial size was normal. 6. The mitral valve is grossly normal. Mild to moderate mitral valve regurgitation. 7. The tricuspid valve is grossly normal. Tricuspid valve regurgitation is trivial. 8. The aortic valve is tricuspid. Aortic valve regurgitation is not visualized. No evidence of aortic valve sclerosis or stenosis. 9. The pulmonic valve was grossly normal. Pulmonic valve regurgitation is not visualized. 10. Normal pulmonary artery systolic pressure. 11. The inferior vena cava is normal in size with greater than 50% respiratory variability, suggesting right atrial pressure of 3 mmHg.   Assessment and Plan:    1. Chest pain, unspecified type Previous episodes of chest pain after colonoscopy.  She was treated and released from [facility] after work-up for ACS.  She was negative for ACS.  CT showed no evidence of PE.  She continued to have chest pain on and off with  and without activity.  She is currently taking Imdur 30 mg daily for chest pain. Continues to complain of some intermittent chest pain without radiation or associated symptoms. She attributes this mostly to fibromyalgia. She states she has flares and has not been able to tolerate medication such as Lyrica etc. Continue Imdur 30 mg.  2. Essential hypertension Blood pressure well controlled on current therapy. Continue losartan 100 mg daily.  3. Pain of upper abdomen Continues to have chest pain/epigastric pain and can sometimes be associated with eating larger meals.  Also states when attempting to swallow sometimes she feels the food gets stuck on occasion.  Advised her to follow-up with her GI specialist for possible EGD. So far she has not followed up.  4. Chronic obstructive pulmonary disease, unspecified COPD type ([place]) Denies any significant exertional dyspnea.  States she stopped smoking approximately 6 months ago.  Patient is seen in hematology oncology for polycythemia.   Recent sleep study was positive for sleep apnea. States she is having trouble obtaining the CPAP machine due to insurance issues.  Medication Adjustments/Labs and Tests Ordered: Current medicines are reviewed at length with the patient today.  Concerns regarding medicines are outlined above.   Disposition: Follow-up with Dr. Domenic Polite or APP 6 months Signed, Levell July, NP 04/06/2020 10:01 AM    [facility] [place], [place], [place] [address] Phone: [phone]; Fax: [phone]

## 2020-04-06 ENCOUNTER — Encounter: Payer: Self-pay | Admitting: Family Medicine

## 2020-04-06 ENCOUNTER — Ambulatory Visit (INDEPENDENT_AMBULATORY_CARE_PROVIDER_SITE_OTHER): Payer: Medicaid Other | Admitting: Family Medicine

## 2020-04-06 ENCOUNTER — Other Ambulatory Visit: Payer: Self-pay

## 2020-04-06 VITALS — BP 114/70 | HR 67 | Ht 67.5 in | Wt 224.4 lb

## 2020-04-06 DIAGNOSIS — R079 Chest pain, unspecified: Secondary | ICD-10-CM | POA: Diagnosis not present

## 2020-04-06 DIAGNOSIS — R1010 Upper abdominal pain, unspecified: Secondary | ICD-10-CM

## 2020-04-06 DIAGNOSIS — J449 Chronic obstructive pulmonary disease, unspecified: Secondary | ICD-10-CM | POA: Diagnosis not present

## 2020-04-06 DIAGNOSIS — I10 Essential (primary) hypertension: Secondary | ICD-10-CM | POA: Diagnosis not present

## 2020-04-06 NOTE — Patient Instructions (Signed)
Medication Instructions:  Continue all current medications.   Labwork: none  Testing/Procedures: none  Follow-Up: 6 months   Any Other Special Instructions Will Be Listed Below (If Applicable).   If you need a refill on your cardiac medications before your next appointment, please call your pharmacy.  

## 2020-06-03 DIAGNOSIS — H538 Other visual disturbances: Secondary | ICD-10-CM | POA: Insufficient documentation

## 2020-06-03 DIAGNOSIS — R519 Headache, unspecified: Secondary | ICD-10-CM | POA: Insufficient documentation

## 2020-06-03 DIAGNOSIS — R42 Dizziness and giddiness: Secondary | ICD-10-CM | POA: Insufficient documentation

## 2020-06-03 DIAGNOSIS — R262 Difficulty in walking, not elsewhere classified: Secondary | ICD-10-CM | POA: Insufficient documentation

## 2020-06-06 DIAGNOSIS — K760 Fatty (change of) liver, not elsewhere classified: Secondary | ICD-10-CM | POA: Insufficient documentation

## 2020-06-06 DIAGNOSIS — Z789 Other specified health status: Secondary | ICD-10-CM | POA: Insufficient documentation

## 2020-07-19 ENCOUNTER — Other Ambulatory Visit: Payer: Self-pay | Admitting: Internal Medicine

## 2020-09-13 DIAGNOSIS — J329 Chronic sinusitis, unspecified: Secondary | ICD-10-CM | POA: Insufficient documentation

## 2020-09-13 DIAGNOSIS — I499 Cardiac arrhythmia, unspecified: Secondary | ICD-10-CM | POA: Insufficient documentation

## 2020-09-13 DIAGNOSIS — I129 Hypertensive chronic kidney disease with stage 1 through stage 4 chronic kidney disease, or unspecified chronic kidney disease: Secondary | ICD-10-CM | POA: Insufficient documentation

## 2020-09-13 DIAGNOSIS — E782 Mixed hyperlipidemia: Secondary | ICD-10-CM | POA: Insufficient documentation

## 2020-09-13 DIAGNOSIS — E669 Obesity, unspecified: Secondary | ICD-10-CM | POA: Insufficient documentation

## 2020-09-13 DIAGNOSIS — Z820 Family history of epilepsy and other diseases of the nervous system: Secondary | ICD-10-CM | POA: Insufficient documentation

## 2020-09-13 DIAGNOSIS — E559 Vitamin D deficiency, unspecified: Secondary | ICD-10-CM | POA: Insufficient documentation

## 2020-09-13 DIAGNOSIS — I10 Essential (primary) hypertension: Secondary | ICD-10-CM | POA: Insufficient documentation

## 2020-09-13 DIAGNOSIS — M797 Fibromyalgia: Secondary | ICD-10-CM | POA: Insufficient documentation

## 2020-09-13 DIAGNOSIS — N952 Postmenopausal atrophic vaginitis: Secondary | ICD-10-CM | POA: Insufficient documentation

## 2020-09-23 ENCOUNTER — Other Ambulatory Visit: Payer: Self-pay | Admitting: Internal Medicine

## 2020-09-26 DIAGNOSIS — F40240 Claustrophobia: Secondary | ICD-10-CM | POA: Insufficient documentation

## 2020-10-09 ENCOUNTER — Encounter: Payer: Self-pay | Admitting: Cardiology

## 2020-10-09 ENCOUNTER — Ambulatory Visit (INDEPENDENT_AMBULATORY_CARE_PROVIDER_SITE_OTHER): Payer: Medicaid Other | Admitting: Cardiology

## 2020-10-09 VITALS — BP 128/68 | HR 72 | Wt 224.2 lb

## 2020-10-09 DIAGNOSIS — I493 Ventricular premature depolarization: Secondary | ICD-10-CM

## 2020-10-09 DIAGNOSIS — I10 Essential (primary) hypertension: Secondary | ICD-10-CM | POA: Diagnosis not present

## 2020-10-09 NOTE — Progress Notes (Signed)
Cardiology Office Note  Date: 10/09/2020   ID: Anita Michael, DOB 08-08-1965, MRN [id number]  PCP:  Dara Lords, NP  Cardiologist:  Rozann Lesches, MD Electrophysiologist:  None   Chief Complaint  Patient presents with  . Cardiac follow-up    History of Present Illness: Anita Michael is a 55 y.o. female former patient of Dr. Bronson Ing now presenting to establish follow-up with me.  I reviewed her records and updated the chart.  She was last seen in September 2021 by Mr. Leonides Sake NP.  She has a history of recurrent chest pain and palpitations over the years.  We discussed her symptoms today.  She does feel like yoga and meditation has been helpful.  Tends to notice the sense of palpitations with activity rather than at rest.  She has not had any syncope.  Previous ECG showed PVCs, upright in the inferior leads and most likely benign and from the outflow tract.  Previous cardiac structural and ischemic testing from 2020 was overall reassuring.  I personally reviewed her ECG today which shows sinus rhythm with right bundle branch block.  I did talk with her about a trial of beta-blocker therapy if symptoms worsen.  For now she voiced comfort with observation.  Echocardiogram in 2020 demonstrated mild to moderate mitral regurgitation, we also discussed this.  Past Medical History:  Diagnosis Date  . Essential hypertension   . Fibromyalgia   . Polycythemia   . PVC's (premature ventricular contractions)     Past Surgical History:  Procedure Laterality Date  . APPENDECTOMY    . BREAST BIOPSY Right 2015   Fibroadenoma  . CHOLECYSTECTOMY    . TONSILLECTOMY      Current Outpatient Medications  Medication Sig Dispense Refill  . AIMOVIG 70 MG/ML SOAJ Inject 70 mg as directed every 30 (thirty) days.    [name] amitriptyline (ELAVIL) 50 MG tablet Take 50 mg by mouth at bedtime.    . cholecalciferol (VITAMIN D3) 25 MCG (1000 UT) tablet Take 1,000 Units by mouth daily.    .  cyclobenzaprine (FLEXERIL) 10 MG tablet Take 1 tablet by mouth at bedtime.    [name] estradiol (ESTRACE) 0.1 MG/GM vaginal cream     . isosorbide mononitrate (IMDUR) 30 MG 24 hr tablet TAKE ONE TABLET BY MOUTH ONCE DAILY. 30 tablet 6  . losartan (COZAAR) 100 MG tablet TAKE ONE TABLET BY MOUTH ONCE DAILY. 90 tablet 2  . nystatin (MYCOSTATIN/NYSTOP) powder as needed.     . nystatin ointment (MYCOSTATIN) APPLY TOPICALLY TOAAFFECTED AREA TWICE DAILY.    [name] PREMARIN vaginal cream APPLY (0.5)MG VAGINALLY(THREE TIMES A WEEK FOR 2 WEEKS. THEN APPLY WEEKLY AS DIRECTED.     No current facility-administered medications for this visit.   Allergies:  Procardia [nifedipine], Latex, and Other   ROS: No syncope.  Physical Exam: VS:  BP 128/68 (BP Location: Left Arm, Patient Position: Sitting, Cuff Size: Large)   Pulse 72   Wt 224 lb 3.2 oz (101.7 kg)   SpO2 98%   BMI 34.60 kg/m , BMI Body mass index is 34.6 kg/m.  Wt Readings from Last 3 Encounters:  10/09/20 224 lb 3.2 oz (101.7 kg)  04/06/20 224 lb 6.4 oz (101.8 kg)  03/16/20 210 lb (95.3 kg)    General: Patient appears comfortable at rest. HEENT: Conjunctiva and lids normal, wearing a mask. Neck: Supple, no elevated JVP or carotid bruits, no thyromegaly. Lungs: Clear to auscultation, nonlabored breathing at rest. Cardiac: Regular rate and rhythm, no  S3 or significant systolic murmur, no pericardial rub. Extremities: No pitting edema.  ECG:  An ECG dated 05/10/2019 was personally reviewed today and demonstrated:  Sinus rhythm with frequent PVCs, upright in the inferior leads, incomplete right bundle branch block.  Recent Labwork:  February 2022: Hemoglobin 15.0, platelets 260  Other Studies Reviewed Today:  Echocardiogram 06/03/2019: 1. Left ventricular ejection fraction, by visual estimation, is 50 to  55%. The left ventricle has normal function. There is mildly increased  left ventricular hypertrophy.  2. Left ventricular diastolic  parameters are consistent with Grade II  diastolic dysfunction (pseudonormalization).  3. Global right ventricle has low normal systolic function.The right  ventricular size is normal. No increase in right ventricular wall  thickness.  4. Left atrial size was normal.  5. Right atrial size was normal.  6. The mitral valve is grossly normal. Mild to moderate mitral valve  regurgitation.  7. The tricuspid valve is grossly normal. Tricuspid valve regurgitation  is trivial.  8. The aortic valve is tricuspid. Aortic valve regurgitation is not  visualized. No evidence of aortic valve sclerosis or stenosis.  9. The pulmonic valve was grossly normal. Pulmonic valve regurgitation is  not visualized.  10. Normal pulmonary artery systolic pressure.  11. The inferior vena cava is normal in size with greater than 50%  respiratory variability, suggesting right atrial pressure of 3 mmHg.   Lexiscan Myoview 06/28/2019:  Resting T wave inversions in leads II, aVF, and V3-6.  Defect 1: There is a medium defect of mild severity present in the mid anteroseptal, mid inferoseptal and mid inferolateral location. The anterosepal defect may be due to conduction defect. A small area of scar cannot entirely be ruled out.  This is a low risk study. No large ischemic territories.  Nuclear stress EF: 51%.  Assessment and Plan:  1.  History of intermittent chest pain and palpitations, no syncope.  Prior ECG showed PVCs, upright in the inferior leads and most likely benign, from the outflow tract.  LVEF low normal range, and no definite ischemia by prior Myoview.  We did discuss trial of beta-blocker if symptoms worsen, for now she was comfortable with observation.  ECG reviewed today.  She has been on long-acting nitrates, no changes were made today.  2.  Essential hypertension, on losartan.  Blood pressure is well controlled today.  Medication Adjustments/Labs and Tests Ordered: Current medicines are  reviewed at length with the patient today.  Concerns regarding medicines are outlined above.   Tests Ordered: Orders Placed This Encounter  Procedures  . EKG 12-Lead    Medication Changes: No orders of the defined types were placed in this encounter.   Disposition:  Follow up 6 months in the [place] office.  Signed, Satira Sark, MD, [facility] 10/09/2020 11:37 AM    [facility] [place], [place],  [address] Phone: [phone]; Fax: [phone]

## 2020-10-09 NOTE — Patient Instructions (Signed)

## 2020-11-22 ENCOUNTER — Encounter: Payer: Self-pay | Admitting: Cardiology

## 2021-02-05 ENCOUNTER — Other Ambulatory Visit (HOSPITAL_COMMUNITY): Payer: Self-pay | Admitting: Neurology

## 2021-02-05 ENCOUNTER — Other Ambulatory Visit: Payer: Self-pay | Admitting: Neurology

## 2021-02-05 DIAGNOSIS — G43909 Migraine, unspecified, not intractable, without status migrainosus: Secondary | ICD-10-CM

## 2021-02-16 ENCOUNTER — Ambulatory Visit (HOSPITAL_COMMUNITY)
Admission: RE | Admit: 2021-02-16 | Discharge: 2021-02-16 | Disposition: A | Discharge status: Home / Self Care | Payer: Medicaid Other | Source: Ambulatory Visit | Attending: Neurology | Admitting: Neurology

## 2021-02-16 ENCOUNTER — Other Ambulatory Visit: Payer: Self-pay

## 2021-02-16 DIAGNOSIS — G43909 Migraine, unspecified, not intractable, without status migrainosus: Secondary | ICD-10-CM | POA: Insufficient documentation

## 2021-02-19 ENCOUNTER — Ambulatory Visit (HOSPITAL_COMMUNITY): Payer: Medicaid Other

## 2021-03-06 ENCOUNTER — Ambulatory Visit (HOSPITAL_COMMUNITY): Payer: Medicaid Other

## 2021-03-09 ENCOUNTER — Other Ambulatory Visit: Payer: Self-pay

## 2021-03-09 ENCOUNTER — Ambulatory Visit (HOSPITAL_COMMUNITY)
Admission: RE | Admit: 2021-03-09 | Discharge: 2021-03-09 | Disposition: A | Discharge status: Home / Self Care | Payer: Medicaid Other | Source: Ambulatory Visit | Attending: Urology | Admitting: Urology

## 2021-03-09 DIAGNOSIS — N281 Cyst of kidney, acquired: Secondary | ICD-10-CM | POA: Diagnosis present

## 2021-03-16 ENCOUNTER — Ambulatory Visit: Payer: Medicaid Other | Admitting: Urology

## 2021-03-20 NOTE — Progress Notes (Signed)
Sent via mychart

## 2021-03-26 NOTE — Progress Notes (Signed)
Report   Cardiology Office Note  Date: 03/27/2021   ID: Anita Michael, DOB [birth year], MRN [id number]  PCP:  Dara Lords, NP  Cardiologist:  Rozann Lesches, MD Electrophysiologist:  None   Chief Complaint  Patient presents with   Cardiac follow-up    History of Present Illness: Anita Michael is a 55 y.o. female last seen in March.  She is here for a routine visit.  Occasional sense of palpitations, no sudden dizziness or syncope.  She has preferred overall conservative management of her frequent PVCs, tends to focus on relaxation techniques.  We have discussed possibility of low-dose beta-blocker if symptoms worsen.  Echocardiogram from November 2020 revealed LVEF 50 to 55%.  ECG in March of this year showed sinus rhythm with right bundle branch block.  Past Medical History:  Diagnosis Date   Essential hypertension    Fibromyalgia    Polycythemia    PVC's (premature ventricular contractions)     Past Surgical History:  Procedure Laterality Date   APPENDECTOMY     BREAST BIOPSY Right 2015   Fibroadenoma   CHOLECYSTECTOMY     TONSILLECTOMY      Current Outpatient Medications  Medication Sig Dispense Refill   AIMOVIG 70 MG/ML SOAJ Inject 70 mg as directed every 30 (thirty) days.     amitriptyline (ELAVIL) 50 MG tablet Take 50 mg by mouth at bedtime.     cholecalciferol (VITAMIN D3) 25 MCG (1000 UT) tablet Take 1,000 Units by mouth daily.     cyclobenzaprine (FLEXERIL) 10 MG tablet Take 1 tablet by mouth at bedtime.     estradiol (ESTRACE) 0.1 MG/GM vaginal cream      losartan (COZAAR) 100 MG tablet TAKE ONE TABLET BY MOUTH ONCE DAILY. 90 tablet 2   nystatin (MYCOSTATIN/NYSTOP) powder as needed.      nystatin ointment (MYCOSTATIN) APPLY TOPICALLY TOAAFFECTED AREA TWICE DAILY.     PREMARIN vaginal cream APPLY (0.5)MG VAGINALLY(THREE TIMES A WEEK FOR 2 WEEKS. THEN APPLY WEEKLY AS DIRECTED.     isosorbide mononitrate (IMDUR) 30 MG 24 hr tablet Take 1 tablet (30 mg  total) by mouth daily. 90 tablet 3   No current facility-administered medications for this visit.   Allergies:  Procardia [nifedipine], Latex, and Other   ROS: No orthopnea or PND.  Physical Exam: VS:  BP 120/80   Pulse 68   Ht '5\' 5"'$  (1.651 m)   Wt 221 lb 6.4 oz (100.4 kg)   SpO2 98%   BMI 36.84 kg/m , BMI Body mass index is 36.84 kg/m.  Wt Readings from Last 3 Encounters:  03/27/21 221 lb 6.4 oz (100.4 kg)  10/09/20 224 lb 3.2 oz (101.7 kg)  04/06/20 224 lb 6.4 oz (101.8 kg)    General: Patient appears comfortable at rest. HEENT: Conjunctiva and lids normal, wearing a mask. Neck: Supple, no elevated JVP or carotid bruits, no thyromegaly. Lungs: Clear to auscultation, nonlabored breathing at rest. Cardiac: Regular rate and rhythm, no S3 or significant systolic murmur, no pericardial rub. Extremities: No pitting edema.  ECG:  An ECG dated 10/09/2020 was personally reviewed today and demonstrated:  Sinus rhythm with right bundle branch block.  Recent Labwork:  June 2022: Potassium 4.2, BUN 12, creatinine 1.01, AST 23, ALT 10 March 2021: WBC 15.0, platelets 228, magnesium 2.1  Other Studies Reviewed Today:  Echocardiogram 06/03/2019:  1. Left ventricular ejection fraction, by visual estimation, is 50 to  55%. The left ventricle has normal function. There is mildly increased  left ventricular hypertrophy.   2. Left ventricular diastolic parameters are consistent with Grade II  diastolic dysfunction (pseudonormalization).   3. Global right ventricle has low normal systolic function.The right  ventricular size is normal. No increase in right ventricular wall  thickness.   4. Left atrial size was normal.   5. Right atrial size was normal.   6. The mitral valve is grossly normal. Mild to moderate mitral valve  regurgitation.   7. The tricuspid valve is grossly normal. Tricuspid valve regurgitation  is trivial.   8. The aortic valve is tricuspid. Aortic valve  regurgitation is not  visualized. No evidence of aortic valve sclerosis or stenosis.   9. The pulmonic valve was grossly normal. Pulmonic valve regurgitation is  not visualized.  10. Normal pulmonary artery systolic pressure.  11. The inferior vena cava is normal in size with greater than 50%  respiratory variability, suggesting right atrial pressure of 3 mmHg.    Lexiscan Myoview 06/28/2019: Resting T wave inversions in leads II, aVF, and V3-6. Defect 1: There is a medium defect of mild severity present in the mid anteroseptal, mid inferoseptal and mid inferolateral location. The anterosepal defect may be due to conduction defect. A small area of scar cannot entirely be ruled out. This is a low risk study. No large ischemic territories. Nuclear stress EF: 51%.  Assessment and Plan:  1.  History of frequent PVCs, upright in the inferior leads and most likely benign from the outflow tract.  LVEF 50 to 55% by last assessment.  She does not describe any escalating symptoms, no sudden syncope.  She continues to focus on relaxation techniques, I have talked with her about low-dose beta-blocker if symptoms worsen.  Continue observation for now.  2.  Essential hypertension, on losartan.  Blood pressure is well controlled today.  Medication Adjustments/Labs and Tests Ordered: Current medicines are reviewed at length with the patient today.  Concerns regarding medicines are outlined above.   Tests Ordered: No orders of the defined types were placed in this encounter.   Medication Changes: Meds ordered this encounter  Medications   isosorbide mononitrate (IMDUR) 30 MG 24 hr tablet    Sig: Take 1 tablet (30 mg total) by mouth daily.    Dispense:  90 tablet    Refill:  3     Disposition:  Follow up  1 year, sooner if needed.  Signed, Satira Sark, MD, [facility] 03/27/2021 12:03 PM    [facility] [place], [place], [place] [address] Phone: [phone]; Fax: [phone]

## 2021-03-27 ENCOUNTER — Ambulatory Visit: Payer: Medicaid Other | Admitting: Cardiology

## 2021-03-27 ENCOUNTER — Encounter: Payer: Self-pay | Admitting: Cardiology

## 2021-03-27 ENCOUNTER — Other Ambulatory Visit: Payer: Self-pay

## 2021-03-27 VITALS — BP 120/80 | HR 68 | Ht 65.0 in | Wt 221.4 lb

## 2021-03-27 DIAGNOSIS — I493 Ventricular premature depolarization: Secondary | ICD-10-CM

## 2021-03-27 MED ORDER — ISOSORBIDE MONONITRATE ER 30 MG PO TB24
30.0000 mg | ORAL_TABLET | Freq: Every day | ORAL | 3 refills | Status: DC
Start: 2021-03-27 — End: 2021-09-10

## 2021-03-27 NOTE — Patient Instructions (Addendum)
Medication Instructions:  Your physician recommends that you continue on your current medications as directed. Please refer to the Current Medication list given to you today.  Labwork: none  Testing/Procedures: none  Follow-Up: Your physician recommends that you schedule a follow-up appointment in: You will receive a reminder call or letter  in the mail in about 10 months reminding you to call and schedule your appointment. If you don't receive this letter, please contact our office.  Any Other Special Instructions Will Be Listed Below (If Applicable).  If you need a refill on your cardiac medications before your next appointment, please call your pharmacy.

## 2021-04-10 ENCOUNTER — Encounter: Payer: Self-pay | Admitting: Urology

## 2021-04-10 ENCOUNTER — Other Ambulatory Visit: Payer: Self-pay

## 2021-04-10 ENCOUNTER — Telehealth (INDEPENDENT_AMBULATORY_CARE_PROVIDER_SITE_OTHER): Payer: Medicaid Other | Admitting: Urology

## 2021-04-10 DIAGNOSIS — N281 Cyst of kidney, acquired: Secondary | ICD-10-CM | POA: Diagnosis not present

## 2021-04-10 NOTE — Progress Notes (Signed)
04/10/2021 2:39 PM   Anita Michael 1966-04-06 [id number]  Referring provider: Dara Lords, NP No address on file  Patient location: home Physician location: office I connected with  Anita Michael on 04/10/21 by a video enabled telemedicine application and verified that I am speaking with the correct person using two identifiers.   I discussed the limitations of evaluation and management by telemedicine. The patient expressed understanding and agreed to proceed.    Followup right renal cyst   HPI: Anita Michael is a 55yo here for followupo for a right renal cyst. She has chronic intermittent right flank pain which is worse with activity. Renal US 03/09/2021 shows a stable right 4.7 simple renal cyst. No hematuria or dysuria. No significant LUTS. No other complaints today   PMH: Past Medical History:  Diagnosis Date   Essential hypertension    Fibromyalgia    Polycythemia    PVC's (premature ventricular contractions)     Surgical History: Past Surgical History:  Procedure Laterality Date   APPENDECTOMY     BREAST BIOPSY Right 2015   Fibroadenoma   CHOLECYSTECTOMY     TONSILLECTOMY      Home Medications:  Allergies as of 04/10/2021       Reactions   Procardia [nifedipine] Other (See Comments)   Heart races   Latex    Other Rash        Medication List        Accurate as of April 10, 2021  2:39 PM. If you have any questions, ask your nurse or doctor.          Aimovig 70 MG/ML Soaj Generic drug: Erenumab-aooe Inject 70 mg as directed every 30 (thirty) days.   amitriptyline 50 MG tablet Commonly known as: ELAVIL Take 50 mg by mouth at bedtime.   cholecalciferol 25 MCG (1000 UNIT) tablet Commonly known as: VITAMIN D3 Take 1,000 Units by mouth daily.   cyclobenzaprine 10 MG tablet Commonly known as: FLEXERIL Take 1 tablet by mouth at bedtime.   estradiol 0.1 MG/GM vaginal cream Commonly known as: ESTRACE   isosorbide mononitrate 30  MG 24 hr tablet Commonly known as: IMDUR Take 1 tablet (30 mg total) by mouth daily.   losartan 100 MG tablet Commonly known as: COZAAR TAKE ONE TABLET BY MOUTH ONCE DAILY.   nystatin ointment Commonly known as: MYCOSTATIN APPLY TOPICALLY TOAAFFECTED AREA TWICE DAILY.   nystatin powder Commonly known as: MYCOSTATIN/NYSTOP as needed.   Premarin vaginal cream Generic drug: conjugated estrogens APPLY (0.5)MG VAGINALLY(THREE TIMES A WEEK FOR 2 WEEKS. THEN APPLY WEEKLY AS DIRECTED.        Allergies:  Allergies  Allergen Reactions   Procardia [Nifedipine] Other (See Comments)    Heart races   Latex    Other Rash    Family History: Family History  Problem Relation Age of Onset   Breast cancer Mother 65   Breast cancer Sister 45   Breast cancer Maternal Grandmother 45   Breast cancer Sister 33    Social History:  reports that she quit smoking about 2 years ago. Her smoking use included cigarettes. She smoked an average of .5 packs per day. She has never used smokeless tobacco. She reports that she does not drink alcohol and does not use drugs.  ROS: All other review of systems were reviewed and are negative except what is noted above in HPI   Laboratory Data: No results found for: WBC, HGB, HCT, MCV, PLT  No results found for:  CREATININE  No results found for: PSA  No results found for: TESTOSTERONE  No results found for: HGBA1C  Urinalysis No results found for: COLORURINE, APPEARANCEUR, LABSPEC, PHURINE, GLUCOSEU, HGBUR, BILIRUBINUR, KETONESUR, PROTEINUR, UROBILINOGEN, NITRITE, LEUKOCYTESUR  No results found for: LABMICR, [place], RBCUA, LABEPIT, MUCUS, BACTERIA  Pertinent Imaging: Renal US 03/09/2021: Images reviewed and discussed with the patient No results found for this or any previous visit.  No results found for this or any previous visit.  No results found for this or any previous visit.  No results found for this or any previous visit.  Results  for orders placed during the Michael encounter of 03/09/21  Ultrasound renal complete  Narrative CLINICAL DATA:  Renal cyst follow-up  EXAM: RENAL / URINARY TRACT ULTRASOUND COMPLETE  COMPARISON:  MRI abdomen 03/13/2020  FINDINGS: Right Kidney:  Renal measurements: 8.7 x 3.7 x 4.7 cm = volume: 79 mL. Diffuse thinning of renal cortex. No mass or hydronephrosis visualized. 4.7 x 4.0 x 3.7 cm simple cyst.  Left Kidney:  Renal measurements: 12.8 x 6.8 x 6.4 cm = volume: 294 mL. Echogenicity within normal limits. No mass or hydronephrosis visualized. Accessory splenule again seen adjacent to left kidney.  Bladder:  Appears normal for degree of bladder distention.  Other:  Diffuse increased echogenicity of the visualized portions of the hepatic parenchyma are a nonspecific indicator of hepatocellular dysfunction, most commonly steatosis.  IMPRESSION: Unchanged simple right renal cyst.   Electronically Signed By: Miachel Roux M.D. On: 03/11/2021 08:31  No results found for this or any previous visit.  No results found for this or any previous visit.  No results found for this or any previous visit.   Assessment & Plan:    Right renal cyst RTC 1 year with a renal US  No follow-ups on file.  Anita Bang, MD  [facility] [place]

## 2021-05-06 ENCOUNTER — Other Ambulatory Visit: Payer: Self-pay

## 2021-05-06 ENCOUNTER — Ambulatory Visit
Admission: EM | Admit: 2021-05-06 | Discharge: 2021-05-06 | Disposition: A | Discharge status: Home / Self Care | Payer: Medicaid Other | Attending: Emergency Medicine | Admitting: Emergency Medicine

## 2021-05-06 DIAGNOSIS — J0100 Acute maxillary sinusitis, unspecified: Secondary | ICD-10-CM

## 2021-05-06 DIAGNOSIS — R509 Fever, unspecified: Secondary | ICD-10-CM

## 2021-05-06 DIAGNOSIS — Z20822 Contact with and (suspected) exposure to covid-19: Secondary | ICD-10-CM

## 2021-05-06 MED ORDER — FLUTICASONE PROPIONATE 50 MCG/ACT NA SUSP: 2.0000 | Freq: Every day | NASAL | 0 refills | Status: AC

## 2021-05-06 MED ORDER — AMOXICILLIN-POT CLAVULANATE 875-125 MG PO TABS: 1.0000 | ORAL_TABLET | Freq: Two times a day (BID) | ORAL | 0 refills | Status: DC

## 2021-05-06 MED ORDER — HYDROCOD POLST-CPM POLST ER 10-8 MG/5ML PO SUER: 5.0000 mL | Freq: Two times a day (BID) | ORAL | 0 refills | Status: DC | PRN

## 2021-05-06 MED ORDER — IBUPROFEN 600 MG PO TABS: 600.0000 mg | ORAL_TABLET | Freq: Four times a day (QID) | ORAL | 0 refills | Status: DC | PRN

## 2021-05-06 MED ORDER — BENZONATATE 200 MG PO CAPS: 200.0000 mg | ORAL_CAPSULE | Freq: Three times a day (TID) | ORAL | 0 refills | Status: DC | PRN

## 2021-05-06 NOTE — Discharge Instructions (Addendum)
Tessalon for the cough during the day, Tussionex for the cough at night.  Flonase for the nasal congestion.  Continue Mucinex to keep the mucous thin and to decongest you.   You may take 600 mg of motrin with 1000 mg of tylenol up to 3-4 times a day as needed for pain. This is an effective combination for pain.   Use a NeilMed sinus rinse with distilled water as often as you want to to reduce nasal congestion. Follow the directions on the box.   Go to www.goodrx.com to look up your medications. This will give you a list of where you can find your prescriptions at the most affordable prices. Or you can ask the pharmacist what the cash price is. This is frequently cheaper than going through insurance.

## 2021-05-06 NOTE — ED Provider Notes (Signed)
HPI  SUBJECTIVE:  Anita Michael is a 55 y.o. female who presents with 4 to 5 days of fevers T-max 102, body aches, headaches, sinus pain and pressure, nasal congestion, clear rhinorrhea, postnasal drip, cough productive of greenish-brown sputum, shortness of breath and diarrhea.  No facial swelling, upper dental pain, sore throat, loss of sense of smell or taste, chest pain, wheezing, nausea, vomiting, abdominal pain.  No known COVID or flu exposure.  She got the second dose of COVID-vaccine and this years flu vaccine.  She had negative home COVID test.  States that she is unable to sleep at night secondary to the cough.  No antibiotics in the past month.  She has been taking ibuprofen 600 mg every 12 hours, Mucinex, NyQuil, Sudafed honey and lemon tea without improvement in her symptoms.  No aggravating factors.  She has a past medical history of fibromyalgia, polycythemia vera, "hole in my heart" and hypertension.  [id number], Dahlia Bailiff, NP   Past Medical History:  Diagnosis Date   Essential hypertension    Fibromyalgia    Polycythemia    PVC's (premature ventricular contractions)     Past Surgical History:  Procedure Laterality Date   APPENDECTOMY     BREAST BIOPSY Right 2015   Fibroadenoma   CHOLECYSTECTOMY     TONSILLECTOMY      Family History  Problem Relation Age of Onset   Breast cancer Mother 9   Breast cancer Sister 74   Breast cancer Maternal Grandmother 40   Breast cancer Sister 71    Social History   Tobacco Use   Smoking status: Former    Packs/day: 0.50    Types: Cigarettes    Quit date: 02/07/2019    Years since quitting: 2.2   Smokeless tobacco: Never  Substance Use Topics   Alcohol use: No   Drug use: No    No current facility-administered medications for this encounter.  Current Outpatient Medications:    amoxicillin-clavulanate (AUGMENTIN) 875-125 MG tablet, Take 1 tablet by mouth 2 (two) times daily. X 7 days, Disp: 14 tablet, Rfl: 0    benzonatate (TESSALON) 200 MG capsule, Take 1 capsule (200 mg total) by mouth 3 (three) times daily as needed for cough., Disp: 30 capsule, Rfl: 0   chlorpheniramine-HYDROcodone (TUSSIONEX PENNKINETIC ER) 10-8 MG/5ML SUER, Take 5 mLs by mouth every 12 (twelve) hours as needed for cough., Disp: 60 mL, Rfl: 0   fluticasone (FLONASE) 50 MCG/ACT nasal spray, Place 2 sprays into both nostrils daily., Disp: 16 g, Rfl: 0   ibuprofen (ADVIL) 600 MG tablet, Take 1 tablet (600 mg total) by mouth every 6 (six) hours as needed., Disp: 30 tablet, Rfl: 0   AIMOVIG 70 MG/ML SOAJ, Inject 70 mg as directed every 30 (thirty) days., Disp: , Rfl:    amitriptyline (ELAVIL) 50 MG tablet, Take 50 mg by mouth at bedtime., Disp: , Rfl:    cholecalciferol (VITAMIN D3) 25 MCG (1000 UT) tablet, Take 1,000 Units by mouth daily., Disp: , Rfl:    cyclobenzaprine (FLEXERIL) 10 MG tablet, Take 1 tablet by mouth at bedtime., Disp: , Rfl:    estradiol (ESTRACE) 0.1 MG/GM vaginal cream, , Disp: , Rfl:    isosorbide mononitrate (IMDUR) 30 MG 24 hr tablet, Take 1 tablet (30 mg total) by mouth daily., Disp: 90 tablet, Rfl: 3   losartan (COZAAR) 100 MG tablet, TAKE ONE TABLET BY MOUTH ONCE DAILY., Disp: 90 tablet, Rfl: 2   nystatin (MYCOSTATIN/NYSTOP) powder, as needed. , Disp: ,  Rfl:    nystatin ointment (MYCOSTATIN), APPLY TOPICALLY TOAAFFECTED AREA TWICE DAILY., Disp: , Rfl:    PREMARIN vaginal cream, APPLY (0.5)MG VAGINALLY(THREE TIMES A WEEK FOR 2 WEEKS. THEN APPLY WEEKLY AS DIRECTED., Disp: , Rfl:   Allergies  Allergen Reactions   Procardia [Nifedipine] Other (See Comments)    Heart races   Latex    Other Rash     ROS  As noted in HPI.   Physical Exam  BP 140/80 (BP Location: Right Arm)   Pulse 71   Temp 98.2 F (36.8 C) (Oral)   Resp 20   SpO2 97%   BP 140/80  Constitutional: Well developed, well nourished, no acute distress Eyes:  EOMI, conjunctiva normal bilaterally HENT: Normocephalic, atraumatic,mucus  membranes moist.  Extensive nasal congestion.  Erythematous, swollen turbinates.  Positive maxillary sinus tenderness.  No frontal sinus tenderness.  Normal oropharynx.  No obvious postnasal drip. Respiratory: Normal inspiratory effort, lungs clear bilaterally, good air movement Cardiovascular: Normal rate, regular rhythm, no murmurs rubs or gallop GI: nondistended skin: No rash, skin intact Musculoskeletal: no deformities Neurologic: Alert & oriented x 3, no focal neuro deficits Psychiatric: Speech and behavior appropriate   ED Course   Medications - No data to display  Orders Placed This Encounter  Procedures   Novel Coronavirus, NAA ([facility])    Standing Status:   Standing    Number of Occurrences:   1    No results found for this or any previous visit (from the past 24 hour(s)). No results found.  ED Clinical Impression  1. Acute non-recurrent maxillary sinusitis   2. Fever, unspecified fever cause   3. Encounter for laboratory testing for COVID-19 virus      ED Assessment/Plan  Will check for COVID, unfortunately patient will be out of the treatment window.  Patient has a maxillary sinusitis she meets criteria for antibiotics due the fevers of 102.  Home with Augmentin, Flonase, saline instigation, Mucinex, Tessalon, Tussionex.  Work note for 2 days.  [facility] narcotic database reviewed.  Feel that it is appropriate to proceed with a controlled substances prescription.  Follow-up with PMD as needed.  Discussed labs,MDM, treatment plan, and plan for follow-up with patient.  patient agrees with plan.   Meds ordered this encounter  Medications   fluticasone (FLONASE) 50 MCG/ACT nasal spray    Sig: Place 2 sprays into both nostrils daily.    Dispense:  16 g    Refill:  0   ibuprofen (ADVIL) 600 MG tablet    Sig: Take 1 tablet (600 mg total) by mouth every 6 (six) hours as needed.    Dispense:  30 tablet    Refill:  0   amoxicillin-clavulanate (AUGMENTIN)  875-125 MG tablet    Sig: Take 1 tablet by mouth 2 (two) times daily. X 7 days    Dispense:  14 tablet    Refill:  0   benzonatate (TESSALON) 200 MG capsule    Sig: Take 1 capsule (200 mg total) by mouth 3 (three) times daily as needed for cough.    Dispense:  30 capsule    Refill:  0   chlorpheniramine-HYDROcodone (TUSSIONEX PENNKINETIC ER) 10-8 MG/5ML SUER    Sig: Take 5 mLs by mouth every 12 (twelve) hours as needed for cough.    Dispense:  60 mL    Refill:  0      *This clinic note was created using [employer]. Therefore, there may be occasional mistakes despite  careful proofreading.  ?    Melynda Ripple, MD 05/07/21 [phone]

## 2021-05-06 NOTE — ED Triage Notes (Signed)
Pt reports having congestion, cough, and fever that started last Wednesday.  Took an at home Covid test that was negative.  Patient states she had tried multiple things at home (medications) that are not working.

## 2021-05-07 LAB — SARS-COV-2, NAA 2 DAY TAT

## 2021-05-07 LAB — NOVEL CORONAVIRUS, NAA: SARS-CoV-2, NAA: NOT DETECTED

## 2021-05-14 DIAGNOSIS — J302 Other seasonal allergic rhinitis: Secondary | ICD-10-CM | POA: Insufficient documentation

## 2021-07-06 ENCOUNTER — Encounter (HOSPITAL_COMMUNITY)
Admission: EM | Disposition: A | Discharge status: Home / Self Care | Payer: Self-pay | Source: Home / Self Care | Attending: Emergency Medicine

## 2021-07-06 ENCOUNTER — Observation Stay (HOSPITAL_COMMUNITY)
Admission: EM | Admit: 2021-07-06 | Discharge: 2021-07-07 | Disposition: A | Discharge status: Home / Self Care | Payer: Medicaid Other | Attending: Cardiovascular Disease | Admitting: Cardiovascular Disease

## 2021-07-06 ENCOUNTER — Emergency Department (HOSPITAL_COMMUNITY): Payer: Medicaid Other

## 2021-07-06 ENCOUNTER — Encounter (HOSPITAL_COMMUNITY): Payer: Self-pay | Admitting: *Deleted

## 2021-07-06 DIAGNOSIS — I2510 Atherosclerotic heart disease of native coronary artery without angina pectoris: Secondary | ICD-10-CM | POA: Diagnosis not present

## 2021-07-06 DIAGNOSIS — I200 Unstable angina: Secondary | ICD-10-CM | POA: Diagnosis not present

## 2021-07-06 DIAGNOSIS — Z87891 Personal history of nicotine dependence: Secondary | ICD-10-CM | POA: Diagnosis not present

## 2021-07-06 DIAGNOSIS — I214 Non-ST elevation (NSTEMI) myocardial infarction: Principal | ICD-10-CM | POA: Insufficient documentation

## 2021-07-06 DIAGNOSIS — I493 Ventricular premature depolarization: Secondary | ICD-10-CM

## 2021-07-06 DIAGNOSIS — Z79899 Other long term (current) drug therapy: Secondary | ICD-10-CM | POA: Insufficient documentation

## 2021-07-06 DIAGNOSIS — Z7982 Long term (current) use of aspirin: Secondary | ICD-10-CM | POA: Diagnosis not present

## 2021-07-06 DIAGNOSIS — R079 Chest pain, unspecified: Secondary | ICD-10-CM | POA: Diagnosis present

## 2021-07-06 DIAGNOSIS — Z20822 Contact with and (suspected) exposure to covid-19: Secondary | ICD-10-CM | POA: Diagnosis not present

## 2021-07-06 DIAGNOSIS — Z720 Tobacco use: Secondary | ICD-10-CM | POA: Insufficient documentation

## 2021-07-06 DIAGNOSIS — I10 Essential (primary) hypertension: Secondary | ICD-10-CM | POA: Insufficient documentation

## 2021-07-06 DIAGNOSIS — I059 Rheumatic mitral valve disease, unspecified: Secondary | ICD-10-CM

## 2021-07-06 HISTORY — DX: Personal history of transient ischemic attack (TIA), and cerebral infarction without residual deficits: Z86.73

## 2021-07-06 HISTORY — DX: Headache, unspecified: R51.9

## 2021-07-06 HISTORY — DX: Atherosclerotic heart disease of native coronary artery without angina pectoris: I25.10

## 2021-07-06 HISTORY — PX: CORONARY STENT INTERVENTION: CATH118234

## 2021-07-06 HISTORY — PX: LEFT HEART CATH AND CORONARY ANGIOGRAPHY: CATH118249

## 2021-07-06 HISTORY — DX: Nonrheumatic mitral (valve) insufficiency: I34.0

## 2021-07-06 LAB — TROPONIN I (HIGH SENSITIVITY)
Troponin I (High Sensitivity): 492 ng/L (ref <18)
Troponin I (High Sensitivity): 622 ng/L (ref <18)
Troponin I (High Sensitivity): 986 ng/L (ref <18)

## 2021-07-06 LAB — D-DIMER, QUANTITATIVE: D-Dimer, Quant: 0.39 ug/mL-FEU (ref 0.00–0.50)

## 2021-07-06 LAB — POCT I-STAT 7, (LYTES, BLD GAS, ICA,H+H)
Acid-base deficit: 2 mmol/L (ref 0.0–2.0)
Bicarbonate: 25.9 mmol/L (ref 20.0–28.0)
Calcium, Ion: 1.36 mmol/L (ref 1.15–1.40)
HCT: 41 % (ref 36.0–46.0)
Hemoglobin: 13.9 g/dL (ref 12.0–15.0)
O2 Saturation: 78 %
Potassium: 4 mmol/L (ref 3.5–5.1)
Sodium: 139 mmol/L (ref 135–145)
TCO2: 28 mmol/L (ref 22–32)
pCO2 arterial: 55.9 mmHg — ABNORMAL HIGH (ref 32.0–48.0)
pH, Arterial: 7.275 — ABNORMAL LOW (ref 7.350–7.450)
pO2, Arterial: 49 mmHg — ABNORMAL LOW (ref 83.0–108.0)

## 2021-07-06 LAB — CBC
HCT: 47.9 % — ABNORMAL HIGH (ref 36.0–46.0)
Hemoglobin: 14.6 g/dL (ref 12.0–15.0)
MCH: 27.4 pg (ref 26.0–34.0)
MCHC: 30.5 g/dL (ref 30.0–36.0)
MCV: 89.9 fL (ref 80.0–100.0)
Platelets: 225 10*3/uL (ref 150–400)
RBC: 5.33 MIL/uL — ABNORMAL HIGH (ref 3.87–5.11)
RDW: 16.5 % — ABNORMAL HIGH (ref 11.5–15.5)
WBC: 7.9 10*3/uL (ref 4.0–10.5)
nRBC: 0 % (ref 0.0–0.2)

## 2021-07-06 LAB — PROTIME-INR
INR: 1.1 (ref 0.8–1.2)
Prothrombin Time: 13.8 seconds (ref 11.4–15.2)

## 2021-07-06 LAB — BASIC METABOLIC PANEL
Anion gap: 10 (ref 5–15)
BUN: 11 mg/dL (ref 6–20)
CO2: 24 mmol/L (ref 22–32)
Calcium: 9.5 mg/dL (ref 8.9–10.3)
Chloride: 105 mmol/L (ref 98–111)
Creatinine, Ser: 1 mg/dL (ref 0.44–1.00)
GFR, Estimated: >60 mL/min (ref >60)
Glucose, Bld: 122 mg/dL — ABNORMAL HIGH (ref 70–99)
Potassium: 3.3 mmol/L — ABNORMAL LOW (ref 3.5–5.1)
Sodium: 139 mmol/L (ref 135–145)

## 2021-07-06 LAB — RESP PANEL BY RT-PCR (FLU A&B, COVID) ARPGX2
Influenza A by PCR: NEGATIVE
Influenza B by PCR: NEGATIVE
SARS Coronavirus 2 by RT PCR: NEGATIVE

## 2021-07-06 LAB — HIV ANTIBODY (ROUTINE TESTING W REFLEX): HIV Screen 4th Generation wRfx: NONREACTIVE

## 2021-07-06 LAB — POCT ACTIVATED CLOTTING TIME: Activated Clotting Time: 257 seconds

## 2021-07-06 SURGERY — LEFT HEART CATH AND CORONARY ANGIOGRAPHY
Anesthesia: LOCAL

## 2021-07-06 MED ORDER — ONDANSETRON HCL 4 MG/2ML IJ SOLN
4.0000 mg | Freq: Four times a day (QID) | INTRAMUSCULAR | Status: DC | PRN
  Administered 2021-07-06: 4 mg via INTRAVENOUS
  Filled 2021-07-06: qty 2

## 2021-07-06 MED ORDER — LIDOCAINE HCL (PF) 1 % IJ SOLN
INTRAMUSCULAR | Status: AC
  Filled 2021-07-06: qty 30

## 2021-07-06 MED ORDER — LIDOCAINE VISCOUS HCL 2 % MT SOLN
15.0000 mL | Freq: Once | OROMUCOSAL | Status: AC
  Administered 2021-07-06: 15 mL via ORAL
  Filled 2021-07-06: qty 15

## 2021-07-06 MED ORDER — ALUM & MAG HYDROXIDE-SIMETH 200-200-20 MG/5ML PO SUSP
30.0000 mL | Freq: Once | ORAL | Status: AC
  Administered 2021-07-06: 30 mL via ORAL
  Filled 2021-07-06: qty 30

## 2021-07-06 MED ORDER — SODIUM CHLORIDE 0.9 % IV SOLN
4.0000 ug/kg/min | INTRAVENOUS | Status: AC
  Filled 2021-07-06: qty 50

## 2021-07-06 MED ORDER — SODIUM CHLORIDE 0.9% FLUSH: 3.0000 mL | Freq: Two times a day (BID) | INTRAVENOUS | Status: DC

## 2021-07-06 MED ORDER — LABETALOL HCL 5 MG/ML IV SOLN: 10.0000 mg | INTRAVENOUS | Status: DC | PRN

## 2021-07-06 MED ORDER — CANGRELOR BOLUS VIA INFUSION
INTRAVENOUS | Status: DC | PRN
  Administered 2021-07-06: 2802 ug via INTRAVENOUS

## 2021-07-06 MED ORDER — HYDRALAZINE HCL 20 MG/ML IJ SOLN: 10.0000 mg | INTRAMUSCULAR | Status: DC | PRN

## 2021-07-06 MED ORDER — NITROGLYCERIN 0.4 MG SL SUBL
0.4000 mg | SUBLINGUAL_TABLET | SUBLINGUAL | Status: DC | PRN
  Administered 2021-07-06 (×3): 0.4 mg via SUBLINGUAL
  Filled 2021-07-06: qty 1

## 2021-07-06 MED ORDER — FENTANYL CITRATE (PF) 100 MCG/2ML IJ SOLN
INTRAMUSCULAR | Status: DC | PRN
  Administered 2021-07-06: 25 ug via INTRAVENOUS

## 2021-07-06 MED ORDER — SODIUM CHLORIDE 0.9 % WEIGHT BASED INFUSION: 1.0000 mL/kg/h | INTRAVENOUS | Status: DC

## 2021-07-06 MED ORDER — ISOSORBIDE MONONITRATE ER 30 MG PO TB24: 30.0000 mg | ORAL_TABLET | Freq: Every day | ORAL | Status: DC

## 2021-07-06 MED ORDER — SODIUM CHLORIDE 0.9 % IV SOLN: 250.0000 mL | INTRAVENOUS | Status: DC | PRN

## 2021-07-06 MED ORDER — SODIUM CHLORIDE 0.9 % IV SOLN
INTRAVENOUS | Status: AC | PRN
  Administered 2021-07-06: 4 ug/kg/min via INTRAVENOUS

## 2021-07-06 MED ORDER — SODIUM CHLORIDE 0.9 % IV SOLN
INTRAVENOUS | Status: AC | PRN
  Administered 2021-07-06: 10 mL/h via INTRAVENOUS

## 2021-07-06 MED ORDER — TICAGRELOR 90 MG PO TABS
90.0000 mg | ORAL_TABLET | Freq: Two times a day (BID) | ORAL | Status: DC
  Administered 2021-07-07: 90 mg via ORAL
  Filled 2021-07-06: qty 1

## 2021-07-06 MED ORDER — HEPARIN (PORCINE) IN NACL 1000-0.9 UT/500ML-% IV SOLN
INTRAVENOUS | Status: DC | PRN
  Administered 2021-07-06 (×2): 500 mL

## 2021-07-06 MED ORDER — NITROGLYCERIN 1 MG/10 ML FOR IR/CATH LAB
INTRA_ARTERIAL | Status: AC
  Filled 2021-07-06: qty 10

## 2021-07-06 MED ORDER — HEPARIN SODIUM (PORCINE) 1000 UNIT/ML IJ SOLN
INTRAMUSCULAR | Status: AC
  Filled 2021-07-06: qty 10

## 2021-07-06 MED ORDER — CANGRELOR TETRASODIUM 50 MG IV SOLR
INTRAVENOUS | Status: AC
  Filled 2021-07-06: qty 50

## 2021-07-06 MED ORDER — SODIUM CHLORIDE 0.9% FLUSH
3.0000 mL | Freq: Once | INTRAVENOUS | Status: AC
  Administered 2021-07-06: 3 mL via INTRAVENOUS

## 2021-07-06 MED ORDER — FENTANYL CITRATE (PF) 100 MCG/2ML IJ SOLN
INTRAMUSCULAR | Status: AC
  Filled 2021-07-06: qty 2

## 2021-07-06 MED ORDER — LOSARTAN POTASSIUM 50 MG PO TABS
100.0000 mg | ORAL_TABLET | Freq: Every day | ORAL | Status: DC
  Administered 2021-07-07: 100 mg via ORAL
  Filled 2021-07-06: qty 2

## 2021-07-06 MED ORDER — TICAGRELOR 90 MG PO TABS
ORAL_TABLET | ORAL | Status: DC | PRN
  Administered 2021-07-06: 180 mg via ORAL

## 2021-07-06 MED ORDER — VERAPAMIL HCL 2.5 MG/ML IV SOLN
INTRAVENOUS | Status: DC | PRN
  Administered 2021-07-06: 10 mL via INTRA_ARTERIAL

## 2021-07-06 MED ORDER — LIDOCAINE HCL (PF) 1 % IJ SOLN
INTRAMUSCULAR | Status: DC | PRN
  Administered 2021-07-06: 2 mL

## 2021-07-06 MED ORDER — NITROGLYCERIN 1 MG/10 ML FOR IR/CATH LAB
INTRA_ARTERIAL | Status: DC | PRN
  Administered 2021-07-06: 200 ug via INTRACORONARY

## 2021-07-06 MED ORDER — SODIUM CHLORIDE 0.9% FLUSH: 3.0000 mL | INTRAVENOUS | Status: DC | PRN

## 2021-07-06 MED ORDER — HEPARIN (PORCINE) 25000 UT/250ML-% IV SOLN
1000.0000 [IU]/h | INTRAVENOUS | Status: DC
  Administered 2021-07-06: 1000 [IU]/h via INTRAVENOUS
  Filled 2021-07-06: qty 250

## 2021-07-06 MED ORDER — ASPIRIN 325 MG PO TABS
325.0000 mg | ORAL_TABLET | Freq: Once | ORAL | Status: AC
  Administered 2021-07-06: 325 mg via ORAL
  Filled 2021-07-06: qty 1

## 2021-07-06 MED ORDER — SODIUM CHLORIDE 0.9 % IV SOLN: INTRAVENOUS | Status: DC

## 2021-07-06 MED ORDER — TICAGRELOR 90 MG PO TABS
ORAL_TABLET | ORAL | Status: AC
  Filled 2021-07-06: qty 2

## 2021-07-06 MED ORDER — ACETAMINOPHEN 325 MG PO TABS: 650.0000 mg | ORAL_TABLET | ORAL | Status: DC | PRN

## 2021-07-06 MED ORDER — MIDAZOLAM HCL 2 MG/2ML IJ SOLN
INTRAMUSCULAR | Status: AC
  Filled 2021-07-06: qty 2

## 2021-07-06 MED ORDER — HEPARIN BOLUS VIA INFUSION
4000.0000 [IU] | Freq: Once | INTRAVENOUS | Status: AC
  Administered 2021-07-06: 4000 [IU] via INTRAVENOUS

## 2021-07-06 MED ORDER — HEPARIN SODIUM (PORCINE) 1000 UNIT/ML IJ SOLN
INTRAMUSCULAR | Status: DC | PRN
  Administered 2021-07-06: 5000 [IU] via INTRAVENOUS
  Administered 2021-07-06: 2000 [IU] via INTRAVENOUS

## 2021-07-06 MED ORDER — AMITRIPTYLINE HCL 25 MG PO TABS
50.0000 mg | ORAL_TABLET | Freq: Every day | ORAL | Status: DC
  Administered 2021-07-06: 50 mg via ORAL
  Filled 2021-07-06: qty 2

## 2021-07-06 MED ORDER — VERAPAMIL HCL 2.5 MG/ML IV SOLN
INTRAVENOUS | Status: AC
  Filled 2021-07-06: qty 2

## 2021-07-06 MED ORDER — METOPROLOL TARTRATE 12.5 MG HALF TABLET
12.5000 mg | ORAL_TABLET | Freq: Two times a day (BID) | ORAL | Status: DC
  Administered 2021-07-06: 12.5 mg via ORAL
  Filled 2021-07-06: qty 1

## 2021-07-06 MED ORDER — ATORVASTATIN CALCIUM 80 MG PO TABS
80.0000 mg | ORAL_TABLET | Freq: Every day | ORAL | Status: DC
  Administered 2021-07-06: 80 mg via ORAL
  Filled 2021-07-06: qty 1

## 2021-07-06 MED ORDER — HEPARIN (PORCINE) IN NACL 1000-0.9 UT/500ML-% IV SOLN
INTRAVENOUS | Status: AC
  Filled 2021-07-06: qty 1000

## 2021-07-06 MED ORDER — ASPIRIN EC 81 MG PO TBEC
81.0000 mg | DELAYED_RELEASE_TABLET | Freq: Every day | ORAL | Status: DC
  Administered 2021-07-07: 81 mg via ORAL
  Filled 2021-07-06: qty 1

## 2021-07-06 MED ORDER — POTASSIUM CHLORIDE CRYS ER 20 MEQ PO TBCR
40.0000 meq | EXTENDED_RELEASE_TABLET | Freq: Once | ORAL | Status: AC
  Administered 2021-07-06: 40 meq via ORAL
  Filled 2021-07-06: qty 2

## 2021-07-06 MED ORDER — MIDAZOLAM HCL 2 MG/2ML IJ SOLN
INTRAMUSCULAR | Status: DC | PRN
  Administered 2021-07-06: 1 mg via INTRAVENOUS

## 2021-07-06 MED ORDER — IOHEXOL 350 MG/ML SOLN
INTRAVENOUS | Status: DC | PRN
  Administered 2021-07-06: 85 mL

## 2021-07-06 SURGICAL SUPPLY — 17 items
BALLN SAPPHIRE 2.5X12 (BALLOONS) ×3
BALLN SAPPHIRE ~~LOC~~ 3.75X12 (BALLOONS) ×2 IMPLANT
BALLOON SAPPHIRE 2.5X12 (BALLOONS) IMPLANT
CATH 5FR JL3.5 JR4 ANG PIG MP (CATHETERS) ×2 IMPLANT
CATH LAUNCHER 5F JR4 (CATHETERS) ×2 IMPLANT
DEVICE RAD COMP TR BAND LRG (VASCULAR PRODUCTS) ×2 IMPLANT
GLIDESHEATH SLEND SS 6F .021 (SHEATH) ×2 IMPLANT
GUIDEWIRE INQWIRE 1.5J.035X260 (WIRE) IMPLANT
INQWIRE 1.5J .035X260CM (WIRE) ×3
KIT ENCORE 26 ADVANTAGE (KITS) ×2 IMPLANT
KIT HEART LEFT (KITS) ×3 IMPLANT
PACK CARDIAC CATHETERIZATION (CUSTOM PROCEDURE TRAY) ×3 IMPLANT
STENT ONYX FRONTIER 3.5X18 (Permanent Stent) ×2 IMPLANT
SYR MEDRAD MARK 7 150ML (SYRINGE) ×3 IMPLANT
TRANSDUCER W/STOPCOCK (MISCELLANEOUS) ×3 IMPLANT
TUBING CIL FLEX 10 FLL-RA (TUBING) ×3 IMPLANT
WIRE COUGAR XT STRL 190CM (WIRE) ×2 IMPLANT

## 2021-07-06 NOTE — H&P (Signed)
Cardiology Admission History and Physical:   Patient ID: Ivi Griffith; [id number]; [birth year]   Admission date: 07/06/2021  Primary Care Provider: [facility] [place] Primary Cardiologist: Rozann Lesches, MD Primary Electrophysiologist: None  Chief Complaint: Chest pain  Patient Profile:   Anita Michael is a 55 y.o. female with a history of frequent PVCs managed conservatively, hypertension, incidentally noted findings of stroke by brain MRI in July, and low risk ischemic testing in 2020.  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  She felt lightheaded when this happened, went to sit down, felt mildly short of breath as well.  When she would get up and try to do things the symptoms got worse, ultimately she called her son who picked her up at the end of the school day and took her in for evaluation.  She has ongoing chest discomfort here, states that it radiates into her arms and up into her jaw.  She has otherwise been hemodynamically stable.  Her ECG shows sinus rhythm with right bundle branch block which is old, unifocal PVCs that are upright in the inferior leads (possibly outflow tract origin) and have been noted previously as well.  Initial high-sensitivity troponin I level is elevated at 492.  Chest x-ray is without acute findings.  She reports no recent changes in health and states that she has been taking her medications regularly.  She was last in the office in September.   Past Medical History:  Diagnosis Date   Essential hypertension    Fibromyalgia    Generalized headaches    History of stroke    Noted incidentally by brain MRI July 2022   Polycythemia    PVC's (premature ventricular contractions)     Past Surgical History:  Procedure Laterality Date   APPENDECTOMY     BREAST BIOPSY Right 2015    Fibroadenoma   CHOLECYSTECTOMY     TONSILLECTOMY       Medications Prior to Admission: Prior to Admission medications   Medication Sig Start Date End Date Taking? Authorizing Provider  AIMOVIG 70 MG/ML SOAJ Inject 70 mg as directed every 30 (thirty) days. 10/27/19   [provider]  amitriptyline (ELAVIL) 50 MG tablet Take 50 mg by mouth at bedtime. 12/22/19   [provider]  amoxicillin-clavulanate (AUGMENTIN) 875-125 MG tablet Take 1 tablet by mouth 2 (two) times daily. X 7 days 05/06/21   Melynda Ripple, MD  benzonatate (TESSALON) 200 MG capsule Take 1 capsule (200 mg total) by mouth 3 (three) times daily as needed for cough. 05/06/21   Melynda Ripple, MD  chlorpheniramine-HYDROcodone [facility] PENNKINETIC ER) 10-8 MG/5ML SUER Take 5 mLs by mouth every 12 (twelve) hours as needed for cough. 05/06/21   Melynda Ripple, MD  cholecalciferol (VITAMIN D3) 25 MCG (1000 UT) tablet Take 1,000 Units by mouth daily.    [provider]  cyclobenzaprine (FLEXERIL) 10 MG tablet Take 1 tablet by mouth at bedtime.    [provider]  estradiol (ESTRACE) 0.1 MG/GM vaginal cream  12/28/19   [provider]  fluticasone (FLONASE) 50 MCG/ACT nasal spray Place 2 sprays into both nostrils daily. 05/06/21   Melynda Ripple, MD  ibuprofen (ADVIL) 600 MG tablet Take 1 tablet (600 mg total) by mouth every 6 (six) hours as needed. 05/06/21   Melynda Ripple, MD  isosorbide mononitrate (IMDUR) 30 MG 24  hr tablet Take 1 tablet (30 mg total) by mouth daily. 03/27/21   Satira Sark, MD  losartan (COZAAR) 100 MG tablet TAKE ONE TABLET BY MOUTH ONCE DAILY. 07/20/20   Verta Ellen., NP  nystatin (MYCOSTATIN/NYSTOP) powder as needed.  12/01/19   [provider]  nystatin ointment (MYCOSTATIN) APPLY TOPICALLY TOAAFFECTED AREA TWICE DAILY. 12/01/19   [provider]  PREMARIN vaginal cream APPLY (0.5)MG VAGINALLY(THREE TIMES A WEEK FOR 2 WEEKS. THEN  APPLY WEEKLY AS DIRECTED. 12/29/19   [provider]     Allergies:    Allergies  Allergen Reactions   Nifedipine Other (See Comments) and Palpitations    Heart races   Latex    Other Rash    Social History:   Social History   Tobacco Use   Smoking status: Former    Packs/day: 0.50    Types: Cigarettes    Quit date: 02/07/2019    Years since quitting: 2.4   Smokeless tobacco: Never  Substance Use Topics   Alcohol use: No     Family History:  The patient's family history includes Breast cancer (age of onset: 38) in her sister; Breast cancer (age of onset: 47) in her sister; Breast cancer (age of onset: 54) in her mother; Breast cancer (age of onset: 26) in her maternal grandmother.    ROS:  Please see the history of present illness.  All other ROS reviewed and negative.     Physical Exam/Data:   Vitals:   07/06/21 1600 07/06/21 1612 07/06/21 1615 07/06/21 1630  BP: 128/84   113/90  Pulse:   76 75  Resp: 15  (!) 25 (!) 25  Temp:      SpO2: 93%  98% 96%  Weight:  93.4 kg     No intake or output data in the 24 hours ending 07/06/21 1638 Filed Weights   07/06/21 1612  Weight: 93.4 kg   Body mass index is 34.28 kg/m.   Gen: Patient appears comfortable at rest. HEENT: Conjunctiva and lids normal, oropharynx clear. Neck: Supple, no elevated JVP or carotid bruits, no thyromegaly. Lungs: Clear to auscultation, nonlabored breathing at rest. Cardiac: Regular rate and rhythm with occasional ectopic beats, no S3 or significant systolic murmur, no pericardial rub. Abdomen: Soft, nontender, bowel sounds present, no guarding or rebound. Extremities: No pitting edema, distal pulses 2+. Skin: Warm and dry. Musculoskeletal: No kyphosis. Neuropsychiatric: Alert and oriented x3, affect grossly appropriate.   EKG:  An ECG dated 07/06/2021 was personally reviewed today and demonstrated:  Sinus rhythm with right bundle branch block and frequent PVCs.  Relevant CV  Studies:  Echocardiogram 06/03/2019:  1. Left ventricular ejection fraction, by visual estimation, is 50 to  55%. The left ventricle has normal function. There is mildly increased  left ventricular hypertrophy.   2. Left ventricular diastolic parameters are consistent with Grade II  diastolic dysfunction (pseudonormalization).   3. Global right ventricle has low normal systolic function.The right  ventricular size is normal. No increase in right ventricular wall  thickness.   4. Left atrial size was normal.   5. Right atrial size was normal.   6. The mitral valve is grossly normal. Mild to moderate mitral valve  regurgitation.   7. The tricuspid valve is grossly normal. Tricuspid valve regurgitation  is trivial.   8. The aortic valve is tricuspid. Aortic valve regurgitation is not  visualized. No evidence of aortic valve sclerosis or stenosis.   9. The pulmonic valve  was grossly normal. Pulmonic valve regurgitation is  not visualized.  10. Normal pulmonary artery systolic pressure.  11. The inferior vena cava is normal in size with greater than 50%  respiratory variability, suggesting right atrial pressure of 3 mmHg.   Lexiscan Myoview 06/28/2019: Resting T wave inversions in leads II, aVF, and V3-6. Defect 1: There is a medium defect of mild severity present in the mid anteroseptal, mid inferoseptal and mid inferolateral location. The anterosepal defect may be due to conduction defect. A small area of scar cannot entirely be ruled out. This is a low risk study. No large ischemic territories. Nuclear stress EF: 51%.  Laboratory Data:  Chemistry Recent Labs  Lab 07/06/21 1414  NA 139  K 3.3*  CL 105  CO2 24  GLUCOSE 122*  BUN 11  CREATININE 1.00  CALCIUM 9.5  GFRNONAA >60  ANIONGAP 10    No results for input(s): PROT, ALBUMIN, AST, ALT, ALKPHOS, BILITOT in the last 168 hours. Hematology Recent Labs  Lab 07/06/21 1414  WBC 7.9  RBC 5.33*  HGB 14.6  HCT 47.9*  MCV  89.9  MCH 27.4  MCHC 30.5  RDW 16.5*  PLT 225   Cardiac Enzymes Recent Labs  Lab 07/06/21 1414 07/06/21 1538  TROPONINIHS 492* 622*   DDimer Recent Labs  Lab 07/06/21 1414  DDIMER 0.39    Radiology/Studies:  DG Chest 2 View  Result Date: 07/06/2021 CLINICAL DATA:  Chest pain.  Chest pain with dizziness and weakness. EXAM: CHEST - 2 VIEW COMPARISON:  Prior chest radiographs 12/06/2019 and earlier. FINDINGS: Heart size within normal limits. Ill-defined opacities within the bilateral lung bases (greater on the left). No evidence of pleural effusion or pneumothorax. No acute bony abnormality identified. IMPRESSION: Ill-defined opacities within the left greater than right lung bases, which may reflect atelectasis (favored) or pneumonia. Electronically Signed   By: Kellie Simmering D.O.   On: 07/06/2021 15:21    Assessment and Plan:   1.  Unstable angina with cardiac enzymes suggesting evolving NSTEMI, initial high-sensitivity troponin I of 492 up to 622.  ECG shows chronic findings, old right bundle branch block.  Symptom onset earlier this afternoon, she continues to have mild chest pressure radiating to the arms and jaw.  Otherwise hemodynamically stable.  2.  History of frequent PVCs managed conservatively, positive in the inferior leads and most likely from the outflow tract.  Last LVEF 50 to 55% by echocardiogram in 2020 and with low risk Myoview at that time.  3.  Essential hypertension, on Cozaar as an outpatient.  Situation reviewed with the patient and also ER staff.  She is being initiated on heparin, did take her morning medications.  Plan to treat with aspirin, beta-blocker, high-dose statin for now with follow-up FLP.  Risks and benefits of a diagnostic cardiac catheterization to assess for revascularization options discussed with the patient and she is in agreement to proceed.  She is being added on to the schedule this afternoon with transfer arranged to [facility].  Signed, Rozann Lesches, MD  07/06/2021 4:38 PM

## 2021-07-06 NOTE — ED Provider Notes (Signed)
[facility] Provider Note   CSN: [id number] Arrival date & time: 07/06/21  1351     History Chief Complaint  Patient presents with   Chest Pain    Anita Michael is a 55 y.o. female.  HPI  Patient with medical history including hypertension, fibromyalgia, polycythemia secondary due to OSA, PVCs presents with chief complaint of left-sided chest pain.  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  She states movement tends to make it worse especially walking, makes her more short of breath, she denies positional or p.o. intake improving or worsening the pain, she has no history of DVTs or PEs currently not on hormone therapy, denies any leg pain or leg swelling, she has no significant cardiac history, she denies illicit drug use but does occasionally smoke, she has history of hypertension without hyperlipidemia and or diabetes, patient states that her father died of a heart attack when he was older.  Patient states he is never had this past, denies any leaving or aggravating factors.  Does endorse fevers, chills, stomach pains, nausea, vomit, diarrhea, general body aches.  Past Medical History:  Diagnosis Date   Essential hypertension    Fibromyalgia    Generalized headaches    History of stroke    Noted incidentally by brain MRI July 2022   Polycythemia    PVC's (premature ventricular contractions)     Patient Active Problem List   Diagnosis Date Noted   Tobacco use 07/06/2021   Mitral valve disorder 07/06/2021   NSTEMI (non-ST elevated myocardial infarction) ([place]) 07/06/2021   Other seasonal allergic rhinitis 05/14/2021   Claustrophobia 09/26/2020   Atrophic vaginitis 09/13/2020   Family  history of muscular dystrophy 09/13/2020   Fibromyalgia 09/13/2020   Hypertensive disorder 09/13/2020   Irregular heart beat 09/13/2020   Mixed hyperlipidemia 09/13/2020   Nephrosclerosis 09/13/2020   Obesity with body mass index 30 or greater 09/13/2020   Sinusitis 09/13/2020   Vitamin D deficiency 09/13/2020   Difficulty with CPAP full face mask use 06/06/2020   Hepatic steatosis 06/06/2020   Blurry vision 06/03/2020   Difficulty walking 06/03/2020   Dizziness 06/03/2020   Headache disorder 06/03/2020   Obstructive sleep apnea syndrome 03/28/2020   Complex renal cyst 03/16/2020   Tubulovillous adenoma of colon 12/14/2019   Blood on toilet paper 11/11/2019   Encounter for screening colonoscopy 11/11/2019   Hematochezia 11/11/2019   Family history of cancer 10/19/2019   Polycythemia 10/19/2019   Facial weakness 02/16/2019   Migraine 02/16/2019    Past Surgical History:  Procedure Laterality Date   APPENDECTOMY     BREAST BIOPSY Right 2015   Fibroadenoma   CHOLECYSTECTOMY     TONSILLECTOMY       OB History   No obstetric history on file.     Family History  Problem Relation Age of Onset   Breast cancer Mother 73   Breast cancer Sister 12   Breast cancer Maternal Grandmother 54   Breast cancer Sister 22    Social History   Tobacco Use   Smoking status: Former    Packs/day: 0.50    Types: Cigarettes    Quit date: 02/07/2019    Years since quitting: 2.4   Smokeless tobacco:  Never  Substance Use Topics   Alcohol use: No   Drug use: No    Home Medications Prior to Admission medications   Medication Sig Start Date End Date Taking? Authorizing Provider  AIMOVIG 70 MG/ML SOAJ Inject 70 mg as directed every 30 (thirty) days. 10/27/19   [provider]  amitriptyline (ELAVIL) 50 MG tablet Take 50 mg by mouth at bedtime. 12/22/19   [provider]  amoxicillin-clavulanate (AUGMENTIN) 875-125 MG tablet Take 1 tablet by mouth 2 (two) times daily. X 7  days 05/06/21   Melynda Ripple, MD  benzonatate (TESSALON) 200 MG capsule Take 1 capsule (200 mg total) by mouth 3 (three) times daily as needed for cough. 05/06/21   Melynda Ripple, MD  chlorpheniramine-HYDROcodone [facility] PENNKINETIC ER) 10-8 MG/5ML SUER Take 5 mLs by mouth every 12 (twelve) hours as needed for cough. 05/06/21   Melynda Ripple, MD  cholecalciferol (VITAMIN D3) 25 MCG (1000 UT) tablet Take 1,000 Units by mouth daily.    [provider]  cyclobenzaprine (FLEXERIL) 10 MG tablet Take 1 tablet by mouth at bedtime.    [provider]  estradiol (ESTRACE) 0.1 MG/GM vaginal cream  12/28/19   [provider]  fluticasone (FLONASE) 50 MCG/ACT nasal spray Place 2 sprays into both nostrils daily. 05/06/21   Melynda Ripple, MD  ibuprofen (ADVIL) 600 MG tablet Take 1 tablet (600 mg total) by mouth every 6 (six) hours as needed. 05/06/21   Melynda Ripple, MD  isosorbide mononitrate (IMDUR) 30 MG 24 hr tablet Take 1 tablet (30 mg total) by mouth daily. 03/27/21   Satira Sark, MD  losartan (COZAAR) 100 MG tablet TAKE ONE TABLET BY MOUTH ONCE DAILY. 07/20/20   Verta Ellen., NP  nystatin (MYCOSTATIN/NYSTOP) powder as needed.  12/01/19   [provider]  nystatin ointment (MYCOSTATIN) APPLY TOPICALLY TOAAFFECTED AREA TWICE DAILY. 12/01/19   [provider]  PREMARIN vaginal cream APPLY (0.5)MG VAGINALLY(THREE TIMES A WEEK FOR 2 WEEKS. THEN APPLY WEEKLY AS DIRECTED. 12/29/19   [provider]    Allergies    Nifedipine, Latex, and Other  Review of Systems   Review of Systems  Constitutional:  Negative for chills and fever.  HENT:  Negative for congestion.   Respiratory:  Negative for shortness of breath.   Cardiovascular:  Positive for chest pain. Negative for palpitations and leg swelling.  Gastrointestinal:  Negative for abdominal pain, diarrhea, nausea and vomiting.  Genitourinary:  Negative for enuresis.   Musculoskeletal:  Negative for back pain.  Skin:  Negative for rash.  Neurological:  Negative for dizziness.  Hematological:  Does not bruise/bleed easily.   Physical Exam Updated Vital Signs BP 139/82 (BP Location: Left Wrist)    Pulse 68    Temp 98.1 F (36.7 C) (Oral)    Resp 18    Wt 93.4 kg    SpO2 93%    BMI 34.28 kg/m   Physical Exam Vitals and nursing note reviewed.  Constitutional:      General: She is not in acute distress.    Appearance: She is not ill-appearing.  HENT:     Head: Normocephalic and atraumatic.     Nose: No congestion.  Eyes:     Conjunctiva/sclera: Conjunctivae normal.  Cardiovascular:     Rate and Rhythm: Normal rate and regular rhythm.     Pulses: Normal pulses.     Heart sounds: No murmur heard.   No friction rub. No gallop.  Pulmonary:  Effort: No respiratory distress.     Breath sounds: No wheezing, rhonchi or rales.  Abdominal:     Palpations: Abdomen is soft.     Tenderness: There is no abdominal tenderness. There is no right CVA tenderness or left CVA tenderness.  Musculoskeletal:     Right lower leg: No edema.     Left lower leg: No edema.  Skin:    General: Skin is warm and dry.  Neurological:     Mental Status: She is alert.  Psychiatric:        Mood and Affect: Mood normal.    ED Results / Procedures / Treatments   Labs (all labs ordered are listed, but only abnormal results are displayed) Labs Reviewed  BASIC METABOLIC PANEL - Abnormal; Notable for the following components:      Result Value   Potassium 3.3 (*)    Glucose, Bld 122 (*)    All other components within normal limits  CBC - Abnormal; Notable for the following components:   RBC 5.33 (*)    HCT 47.9 (*)    RDW 16.5 (*)    All other components within normal limits  TROPONIN I (HIGH SENSITIVITY) - Abnormal; Notable for the following components:   Troponin I (High Sensitivity) 492 (*)    All other components within normal limits  TROPONIN I (HIGH  SENSITIVITY) - Abnormal; Notable for the following components:   Troponin I (High Sensitivity) 622 (*)    All other components within normal limits  TROPONIN I (HIGH SENSITIVITY) - Abnormal; Notable for the following components:   Troponin I (High Sensitivity) 986 (*)    All other components within normal limits  RESP PANEL BY RT-PCR (FLU A&B, COVID) ARPGX2  PROTIME-INR  D-DIMER, QUANTITATIVE  HIV ANTIBODY (ROUTINE TESTING W REFLEX)  BASIC METABOLIC PANEL  LIPID PANEL  HEMOGLOBIN A1C  HEPARIN LEVEL (UNFRACTIONATED)  CBC    EKG EKG Interpretation  Date/Time:  Friday July 06 2021 14:01:34 EST Ventricular Rate:  82 PR Interval:  55 QRS Duration: 161 QT Interval:  408 QTC Calculation: 477 R Axis:   117 Text Interpretation: Sinus rhythm Multiple ventricular premature complexes Short PR interval RBBB and LPFB No old tracing to compare in MUSE but EKG from Cards office 10/09/20 had similar appearing RBBB, PVCs are new Confirmed by Calvert Cantor [phone]) on 07/06/2021 3:52:09 PM  Radiology DG Chest 2 View  Result Date: 07/06/2021 CLINICAL DATA:  Chest pain.  Chest pain with dizziness and weakness. EXAM: CHEST - 2 VIEW COMPARISON:  Prior chest radiographs 12/06/2019 and earlier. FINDINGS: Heart size within normal limits. Ill-defined opacities within the bilateral lung bases (greater on the left). No evidence of pleural effusion or pneumothorax. No acute bony abnormality identified. IMPRESSION: Ill-defined opacities within the left greater than right lung bases, which may reflect atelectasis (favored) or pneumonia. Electronically Signed   By: Kellie Simmering D.O.   On: 07/06/2021 15:21    Procedures .Critical Care Performed by: Marcello Fennel, PA-C Authorized by: Marcello Fennel, PA-C   Critical care provider statement:    Critical care time (minutes):  30   Critical care time was exclusive of:  Separately billable procedures and treating other patients   Critical care  was necessary to treat or prevent imminent or life-threatening deterioration of the following conditions:  Cardiac failure   Critical care was time spent personally by me on the following activities:  Development of treatment plan with patient or surrogate, discussions with consultants, evaluation  of patient's response to treatment, examination of patient, ordering and review of laboratory studies, ordering and review of radiographic studies, ordering and performing treatments and interventions, pulse oximetry, re-evaluation of patient's condition and review of old charts   I assumed direction of critical care for this patient from another provider in my specialty: no     Care discussed with: admitting provider     Medications Ordered in ED Medications  sodium chloride flush (NS) 0.9 % injection 3 mL ( Intravenous Automatically Held 07/14/21 2200)  sodium chloride flush (NS) 0.9 % injection 3 mL (has no administration in time range)  0.9 %  sodium chloride infusion (has no administration in time range)  0.9 %  sodium chloride infusion (has no administration in time range)  isosorbide mononitrate (IMDUR) 24 hr tablet 30 mg ( Oral Automatically Held 07/15/21 1000)  losartan (COZAAR) tablet 100 mg ( Oral Automatically Held 07/15/21 1000)  amitriptyline (ELAVIL) tablet 50 mg ( Oral Automatically Held 07/14/21 2200)  aspirin EC tablet 81 mg ( Oral Automatically Held 07/15/21 1000)  nitroGLYCERIN (NITROSTAT) SL tablet 0.4 mg ( Sublingual MAR Hold 07/06/21 1837)  acetaminophen (TYLENOL) tablet 650 mg ( Oral MAR Hold 07/06/21 1837)  ondansetron (ZOFRAN) injection 4 mg ( Intravenous MAR Hold 07/06/21 1837)  atorvastatin (LIPITOR) tablet 80 mg ( Oral Automatically Held 07/14/21 1800)  metoprolol tartrate (LOPRESSOR) tablet 12.5 mg ( Oral Automatically Held 07/14/21 2200)  heparin ADULT infusion 100 units/mL (25000 units/233mL) (1,000 Units/hr Intravenous New Bag/Given 07/06/21 1647)  sodium chloride flush  (NS) 0.9 % injection 3 mL (3 mLs Intravenous Given 07/06/21 1423)  aspirin tablet 325 mg (325 mg Oral Given 07/06/21 1546)  alum & mag hydroxide-simeth (MAALOX/MYLANTA) 200-200-20 MG/5ML suspension 30 mL (30 mLs Oral Given 07/06/21 1546)    And  lidocaine (XYLOCAINE) 2 % viscous mouth solution 15 mL (15 mLs Oral Given 07/06/21 1546)  potassium chloride SA (KLOR-CON M) CR tablet 40 mEq (40 mEq Oral Given 07/06/21 1609)  heparin bolus via infusion 4,000 Units (4,000 Units Intravenous Bolus from Bag 07/06/21 1648)    ED Course  I have reviewed the triage vital signs and the nursing notes.  Pertinent labs & imaging results that were available during my care of the patient were reviewed by me and considered in my medical decision making (see chart for details).    MDM Rules/Calculators/A&P                         Initial impression-presents with chest pain, alert, no acute stress, vital signs reassuring.  Unclear etiology, will obtain basic lab work-up, add on D-dimer for shortness of breath provide GI cocktail, aspirin and reassess.  Work-up-CBC unremarkable, BMP shows potassium 3.3, glucose 122, D-dimer 0.39, first troponin elevated at 452, second troponin is 622 EKG s without signs of ischemia, chest x-ray shows ill-defined opacities within the left greater than the right lung base may reflect atelectasis and/or pneumonia.  Reassessment-patient has elevated troponins with increased risk factors and chest pain, I am concerned for possible NSTEMI, will consult with cardiology for further recommendations.  Patient is reassessed, updated on recommendation from cardiology she agreed in this plan.  Consult-spoke with Dr. Domenic Polite who will come down and evaluate the patient, recommend starting patient on heparin, patient may have to be transferred to [facility] for catheterization.  Dr. Domenic Polite will transfer patient to [facility] for cardiac catheterization.  Rule out- I have low suspicion for  ACS, patient  has no cardiac history, EKG was sinus rhythm without signs of ischemia.  Patient does have a trending troponins I suspect this like secondary due to NSTEMI.  Low suspicion for PE as patient denies pleuritic chest pain, patient denies leg pain, no pedal edema noted on exam, D-dimer is negative.  Low suspicion for AAA or aortic dissection as history is atypical, patient has low risk factors.  Low suspicion for systemic infection as patient is nontoxic-appearing, vital signs reassuring, no obvious source infection noted on exam.   Plan-admission and transfer to [facility] for cardiac catheterization.    Final Clinical Impression(s) / ED Diagnoses Final diagnoses:  Non-ST elevation (NSTEMI) myocardial infarction [facility])    Rx / [place] Orders ED Discharge Orders     None        Aron Baba 07/06/21 1840    Truddie Hidden, MD 07/06/21 2308

## 2021-07-06 NOTE — Progress Notes (Signed)
ANTICOAGULATION CONSULT NOTE - Initial Consult  Pharmacy Consult for heparin gtt  Indication: chest pain/ACS  Allergies  Allergen Reactions   Nifedipine Other (See Comments) and Palpitations    Heart races   Latex    Other Rash    Patient Measurements: Weight: 93.4 kg (206 lb) Heparin Dosing Weight:     Vital Signs: Temp: 97.5 F (36.4 C) (12/16 1401) BP: 128/84 (12/16 1600) Pulse Rate: 72 (12/16 1530)  Labs: Recent Labs    07/06/21 1414  HGB 14.6  HCT 47.9*  PLT 225  LABPROT 13.8  INR 1.1  CREATININE 1.00    Estimated Creatinine Clearance: 70.2 mL/min (by C-G formula based on SCr of 1 mg/dL).   Medical History: Past Medical History:  Diagnosis Date   Essential hypertension    Fibromyalgia    History of stroke    Noted incidentally by brain MRI July 2022   Polycythemia    PVC's (premature ventricular contractions)     Medications:  (Not in a hospital admission)  Scheduled:   [START ON 07/07/2021] aspirin EC  81 mg Oral Daily   atorvastatin  80 mg Oral q1800   heparin  4,000 Units Intravenous Once   metoprolol tartrate  12.5 mg Oral BID   sodium chloride flush  3 mL Intravenous Q12H   Infusions:   heparin     PRN:  Anti-infectives (From admission, onward)    None       Assessment: Anita Michael a 55 y.o. female requires anticoagulation with a heparin iv infusion for the indication of  chest pain/ACS. Heparin gtt will be started following pharmacy protocol per pharmacy consult. Patient is not on previous oral anticoagulant that will require aPTT/HL correlation before transitioning to only HL monitoring.   Goal of Therapy:  Heparin level 0.3-0.7 units/ml Monitor platelets by anticoagulation protocol: Yes   Plan:  Give 4000 units bolus x 1 Start heparin infusion at 1000 units/hr Check anti-Xa level in 6 hours and daily while on heparin Continue to monitor H&H and platelets   Anita Michael 07/06/2021,4:21 PM

## 2021-07-06 NOTE — Interval H&P Note (Signed)
Cath Lab Visit (complete for each Cath Lab visit)  Clinical Evaluation Leading to the Procedure:   ACS: Yes.    Non-ACS:    Anginal Classification: CCS IV  Anti-ischemic medical therapy: Minimal Therapy (1 class of medications)  Non-Invasive Test Results: No non-invasive testing performed  Prior CABG: No previous CABG      History and Physical Interval Note:  07/06/2021 5:43 PM  Christoper Fabian  has presented today for surgery, with the diagnosis of nstemi.  The various methods of treatment have been discussed with the patient and family. After consideration of risks, benefits and other options for treatment, the patient has consented to  Procedure(s): LEFT HEART CATH AND CORONARY ANGIOGRAPHY (N/A) as a surgical intervention.  The patient's history has been reviewed, patient examined, no change in status, stable for surgery.  I have reviewed the patient's chart and labs.  Questions were answered to the patient's satisfaction.     Sherren Mocha

## 2021-07-06 NOTE — ED Notes (Signed)
Put pure wick on pt. 

## 2021-07-06 NOTE — ED Notes (Signed)
Osygen sats dropped to 84% on room air, Oxygen applied at 2lnc

## 2021-07-06 NOTE — ED Notes (Signed)
Pt vomited up aspirin and gi cocktail provider notified.

## 2021-07-06 NOTE — Progress Notes (Signed)
7023 Patient arrived from [facility], transferred by ambulance. Patient stated she had a little pressure mid chest. Increases to pain with movement. Denies shortness of breath or nausea at this time. Heparin infusing at 1000 units, 10 mls. Patients cardiac monitor showed NSR with frequent unifocal PVC's noted.  Cath lab called for patient and patient transferred to cath lab with [facility] staff on telemetry in bed at 1837

## 2021-07-06 NOTE — ED Triage Notes (Signed)
Chest pain onset 12noon today

## 2021-07-06 NOTE — Plan of Care (Signed)

## 2021-07-06 NOTE — ED Notes (Signed)
Date and time results received: 07/06/21 1629  (use smartphrase ".now" to insert current time)  Test: trop Critical Value: 622  Name of Provider Notified: w. Ileene Patrick, [place]  Orders Received? Or Actions Taken?: na

## 2021-07-06 NOTE — ED Notes (Signed)
Pt vomited half of one of potassium pills. EDP made aware.

## 2021-07-07 ENCOUNTER — Inpatient Hospital Stay (HOSPITAL_COMMUNITY): Payer: Medicaid Other

## 2021-07-07 ENCOUNTER — Encounter (HOSPITAL_COMMUNITY): Payer: Self-pay | Admitting: Cardiology

## 2021-07-07 DIAGNOSIS — I059 Rheumatic mitral valve disease, unspecified: Secondary | ICD-10-CM

## 2021-07-07 DIAGNOSIS — I214 Non-ST elevation (NSTEMI) myocardial infarction: Secondary | ICD-10-CM

## 2021-07-07 DIAGNOSIS — I493 Ventricular premature depolarization: Secondary | ICD-10-CM

## 2021-07-07 DIAGNOSIS — I10 Essential (primary) hypertension: Secondary | ICD-10-CM | POA: Diagnosis not present

## 2021-07-07 DIAGNOSIS — Z87891 Personal history of nicotine dependence: Secondary | ICD-10-CM | POA: Diagnosis not present

## 2021-07-07 DIAGNOSIS — Z20822 Contact with and (suspected) exposure to covid-19: Secondary | ICD-10-CM | POA: Diagnosis not present

## 2021-07-07 LAB — CBC
HCT: 43.1 % (ref 36.0–46.0)
HCT: 45.3 % (ref 36.0–46.0)
Hemoglobin: 13.2 g/dL (ref 12.0–15.0)
Hemoglobin: 13.6 g/dL (ref 12.0–15.0)
MCH: 26.7 pg (ref 26.0–34.0)
MCH: 26.4 pg (ref 26.0–34.0)
MCHC: 30.6 g/dL (ref 30.0–36.0)
MCHC: 30 g/dL (ref 30.0–36.0)
MCV: 87.2 fL (ref 80.0–100.0)
MCV: 88 fL (ref 80.0–100.0)
Platelets: 211 10*3/uL (ref 150–400)
Platelets: 206 10*3/uL (ref 150–400)
RBC: 4.94 MIL/uL (ref 3.87–5.11)
RBC: 5.15 MIL/uL — ABNORMAL HIGH (ref 3.87–5.11)
RDW: 16.4 % — ABNORMAL HIGH (ref 11.5–15.5)
RDW: 16.4 % — ABNORMAL HIGH (ref 11.5–15.5)
WBC: 9 10*3/uL (ref 4.0–10.5)
WBC: 8.8 10*3/uL (ref 4.0–10.5)
nRBC: 0 % (ref 0.0–0.2)
nRBC: 0 % (ref 0.0–0.2)

## 2021-07-07 LAB — BASIC METABOLIC PANEL
Anion gap: 6 (ref 5–15)
Anion gap: 5 (ref 5–15)
BUN: 7 mg/dL (ref 6–20)
BUN: 5 mg/dL — ABNORMAL LOW (ref 6–20)
CO2: 25 mmol/L (ref 22–32)
CO2: 26 mmol/L (ref 22–32)
Calcium: 9.3 mg/dL (ref 8.9–10.3)
Calcium: 9.7 mg/dL (ref 8.9–10.3)
Chloride: 109 mmol/L (ref 98–111)
Chloride: 109 mmol/L (ref 98–111)
Creatinine, Ser: 0.87 mg/dL (ref 0.44–1.00)
Creatinine, Ser: 0.94 mg/dL (ref 0.44–1.00)
GFR, Estimated: >60 mL/min (ref >60)
GFR, Estimated: >60 mL/min (ref >60)
Glucose, Bld: 118 mg/dL — ABNORMAL HIGH (ref 70–99)
Glucose, Bld: 106 mg/dL — ABNORMAL HIGH (ref 70–99)
Potassium: 3.7 mmol/L (ref 3.5–5.1)
Potassium: 4.5 mmol/L (ref 3.5–5.1)
Sodium: 140 mmol/L (ref 135–145)
Sodium: 140 mmol/L (ref 135–145)

## 2021-07-07 LAB — LIPID PANEL
Cholesterol: 180 mg/dL (ref 0–200)
Cholesterol: 195 mg/dL (ref 0–200)
HDL: 34 mg/dL — ABNORMAL LOW (ref >40)
HDL: 36 mg/dL — ABNORMAL LOW (ref >40)
LDL Cholesterol: 100 mg/dL — ABNORMAL HIGH (ref 0–99)
LDL Cholesterol: 116 mg/dL — ABNORMAL HIGH (ref 0–99)
Total CHOL/HDL Ratio: 5.3 RATIO
Total CHOL/HDL Ratio: 5.4 RATIO
Triglycerides: 232 mg/dL — ABNORMAL HIGH (ref <150)
Triglycerides: 214 mg/dL — ABNORMAL HIGH (ref <150)
VLDL: 46 mg/dL — ABNORMAL HIGH (ref 0–40)
VLDL: 43 mg/dL — ABNORMAL HIGH (ref 0–40)

## 2021-07-07 LAB — ECHOCARDIOGRAM COMPLETE
AR max vel: 2.7 cm2
AV Area VTI: 3.25 cm2
AV Area mean vel: 2.35 cm2
AV Mean grad: 2 mmHg
AV Peak grad: 4.2 mmHg
Ao pk vel: 1.03 m/s
Area-P 1/2: 4.89 cm2
MV M vel: 5.05 m/s
MV Peak grad: 102 mmHg
Radius: 0.8 cm
S' Lateral: 4.4 cm
Weight: 3345.6 oz

## 2021-07-07 LAB — HEMOGLOBIN A1C
Hgb A1c MFr Bld: 6.2 % — ABNORMAL HIGH (ref 4.8–5.6)
Mean Plasma Glucose: 131.24 mg/dL

## 2021-07-07 MED ORDER — TICAGRELOR 90 MG PO TABS: 90.0000 mg | ORAL_TABLET | Freq: Two times a day (BID) | ORAL | 3 refills | Status: DC

## 2021-07-07 MED ORDER — ISOSORBIDE MONONITRATE ER 30 MG PO TB24: 30.0000 mg | ORAL_TABLET | Freq: Every day | ORAL | Status: DC

## 2021-07-07 MED ORDER — METOPROLOL TARTRATE 25 MG PO TABS: 25.0000 mg | ORAL_TABLET | Freq: Two times a day (BID) | ORAL | 1 refills | Status: DC

## 2021-07-07 MED ORDER — METOPROLOL TARTRATE 25 MG PO TABS
25.0000 mg | ORAL_TABLET | Freq: Two times a day (BID) | ORAL | Status: DC
  Administered 2021-07-07: 25 mg via ORAL
  Filled 2021-07-07: qty 1

## 2021-07-07 MED ORDER — ASPIRIN 81 MG PO TBEC: 81.0000 mg | DELAYED_RELEASE_TABLET | Freq: Every day | ORAL | 11 refills | Status: AC

## 2021-07-07 MED ORDER — NITROGLYCERIN 0.4 MG SL SUBL: 0.4000 mg | SUBLINGUAL_TABLET | SUBLINGUAL | 2 refills | Status: DC | PRN

## 2021-07-07 MED ORDER — TICAGRELOR 90 MG PO TABS: 90.0000 mg | ORAL_TABLET | Freq: Two times a day (BID) | ORAL | 0 refills | Status: DC

## 2021-07-07 MED ORDER — ATORVASTATIN CALCIUM 80 MG PO TABS: 80.0000 mg | ORAL_TABLET | Freq: Every day | ORAL | 1 refills | Status: DC

## 2021-07-07 NOTE — TOC CM/SW Note (Signed)
Went to bedside to provide a [employer] card. Patient denies having any other needs.    Marthenia Rolling, MSN, RN,BSN [facility] Case Manager [phone]

## 2021-07-07 NOTE — Progress Notes (Signed)
°  Echocardiogram 2D Echocardiogram has been performed.  Merrie Roof F 07/07/2021, 9:47 AM

## 2021-07-07 NOTE — Progress Notes (Signed)
CARDIAC REHAB PHASE I   PRE:  Rate/Rhythm: sr 73 frequent PVCs    BP: sitting 115/63    SaO2: 93 ra  MODE:  Ambulation: 470 ft   POST:  Rate/Rhythm: 88 sr frequent PVCs    BP: sitting 107/71     SaO2: 95 ra  [id number] Patient ambulated in hallway x 1 assist with slow unsteady wobbly gait per her norm. Neurology aware. Denied complaints. Post ambulation patient back to bedside sitting. Education completed including MI booklet, DAPT, nitro, exercise and activity guidelines, nutrition, and phase 2 CR. May not be good candidate secondary to chronic pain from fibro. Referral placed with patient's permission however she will decide if she wants to persue referral once she is contacted. Anticipate D/C within 24 hours.   Tenasia Aull Minus Breeding RN, BSN

## 2021-07-07 NOTE — Discharge Summary (Addendum)
Discharge Summary    Patient ID: Anita Michael MRN: [id number]; DOB: 11-Feb-1966  Admit date: 07/06/2021 Discharge date: 07/07/2021  PCP:  Gwenlyn Saran, [facility] Cardiologist:  Rozann Lesches, MD   {   Discharge Diagnoses    Principal Problem:   NSTEMI (non-ST elevated myocardial infarction) [facility]) Active Problems:   Mitral valve disorder   PVC's (premature ventricular contractions)   Diagnostic Studies/Procedures    Cardiac Catheterization: 07/06/2021   1st Diag lesion is 50% stenosed.   1st Mrg lesion is 50% stenosed.   Mid RCA lesion is 99% stenosed.   A drug-eluting stent was successfully placed using a STENT ONYX FRONTIER 3.5X18.   Post intervention, there is a 0% residual stenosis.   1.  Severe single-vessel coronary artery disease with critical stenosis of the mid RCA, treated successfully with PCI using a 3.5 x 18 mm resolute Onyx DES 2.  Mild nonobstructive diagonal and obtuse marginal stenoses 3.  Mildly elevated LVEDP   Recommend: Continue cangrelor x2 hours then stop, check 2D echocardiogram, post MI medical therapy.  Dual antiplatelet therapy with aspirin and ticagrelor x12 months without interruption.  Medical therapy for mild residual coronary artery disease.  Echocardiogram: 07/07/2021 IMPRESSIONS     1. Left ventricular ejection fraction, by estimation, is 60 to 65%. The  left ventricle has normal function. The left ventricle has no regional  wall motion abnormalities. Left ventricular diastolic parameters were  normal.   2. Right ventricular systolic function is normal. The right ventricular  size is normal.   3. Left atrial size was moderately dilated.   4. The mitral valve is normal in structure. Moderate to severe mitral  valve regurgitation. No evidence of mitral stenosis.   5. The aortic valve is tricuspid. Aortic valve regurgitation is not  visualized. No aortic stenosis is present.   6. The  inferior vena cava is dilated in size with <50% respiratory  variability, suggesting right atrial pressure of 15 mmHg.    History of Present Illness     Anita Michael is a 55 y.o. female with with past medical history of HTN, Fibromyalgia, prior CVA and palpitations (PVC's noted on prior EKG's) who presented to [facility] ED on 07/06/2021 for evaluation of chest pain.   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 Symptoms persisted through the afternoon and she presented to the ED for evaluation. Her EKG showed an old RBBB and frequent PVC's which were noted on prior imaging. Initial Hs Troponin was elevated to 492 with repeat of 622 and 986, therefore she was transferred to [facility] for an urgent catheterization given her continued chest pain.   Hospital Course     Consultants: None   Her catheterization showed severe single-vessel CAD with 99% stenosis along mid-RCA which was treated with DES placement. She did have residual 50% stenosis along 1st Diag and 1st Mrg. Received cangrelor for 2 hours then was recommended to be on ASA and Brilinta for 12 months.   This morning, she reports overall doing well. Still with some fatigue but no recurrent chest pain or dyspnea. Ambulated with cardiac rehab without any anginal symptoms. She did have frequent PVC's as noted during prior visits and newly started Lopressor was titrated to 25mg  BID. Follow-up labs showed her Hgb remained stable at 13.6 with platelets at 206. Creatinine at 0.94. FLP did show LDL at 116 and she was started  on Atorvastatin 80mg  daily this admission and will need repeat FLP and LFT's in 6-8 weeks. Echo showed a preserved EF of 60-65% with no regional WMA. She did have moderate to severe MR and this will need to be followed on an outpatient basis. She was examined by Dr. Acie Fredrickson and deemed stable for discharge. Follow-up has been arranged.     Did the patient have an acute coronary syndrome (MI, NSTEMI, STEMI, etc) this admission?:  No                               Did the patient have a percutaneous coronary intervention (stent / angioplasty)?:  Yes.     Cath/PCI Registry Performance & Quality Measures: Aspirin prescribed? - Yes ADP Receptor Inhibitor (Plavix/Clopidogrel, Brilinta/Ticagrelor or Effient/Prasugrel) prescribed (includes medically managed patients)? - Yes High Intensity Statin (Lipitor 40-80mg  or Crestor 20-40mg ) prescribed? - Yes For EF <40%, was ACEI/ARB prescribed? - Not Applicable (EF >/= 40%) For EF <40%, Aldosterone Antagonist (Spironolactone or Eplerenone) prescribed? - Not Applicable (EF >/= 98%) Cardiac Rehab Phase II ordered? - Yes      _____________  Discharge Vitals Blood pressure 104/64, pulse 68, temperature 98.1 F (36.7 C), temperature source Oral, resp. rate 16, weight 94.8 kg, SpO2 93 %.  Filed Weights   07/06/21 1612 07/07/21 0500  Weight: 93.4 kg 94.8 kg    Labs & Radiologic Studies    CBC Recent Labs    07/06/21 1414 07/06/21 1909 07/07/21 1028  WBC 7.9  --  8.8  HGB 14.6 13.9 13.6  HCT 47.9* 41.0 45.3  MCV 89.9  --  88.0  PLT 225  --  119   Basic Metabolic Panel Recent Labs    07/06/21 1414 07/06/21 1909 07/07/21 1028  NA 139 139 140  K 3.3* 4.0 4.5  CL 105  --  109  CO2 24  --  26  GLUCOSE 122*  --  106*  BUN 11  --  5*  CREATININE 1.00  --  0.94  CALCIUM 9.5  --  9.7   Liver Function Tests No results for input(s): AST, ALT, ALKPHOS, BILITOT, PROT, ALBUMIN in the last 72 hours. No results for input(s): LIPASE, AMYLASE in the last 72 hours. High Sensitivity Troponin:   Recent Labs  Lab 07/06/21 1414 07/06/21 1538 07/06/21 1640  TROPONINIHS 492* 622* 986*    BNP Invalid input(s): POCBNP D-Dimer Recent Labs    07/06/21 1414  DDIMER 0.39   Hemoglobin A1C No results for input(s): HGBA1C in the last 72 hours. Fasting Lipid Panel Recent Labs     07/07/21 1028  CHOL 195  HDL 36*  LDLCALC 116*  TRIG 214*  CHOLHDL 5.4   Thyroid Function Tests No results for input(s): TSH, T4TOTAL, T3FREE, THYROIDAB in the last 72 hours.  Invalid input(s): FREET3 _____________  DG Chest 2 View  Result Date: 07/06/2021 CLINICAL DATA:  Chest pain.  Chest pain with dizziness and weakness. EXAM: CHEST - 2 VIEW COMPARISON:  Prior chest radiographs 12/06/2019 and earlier. FINDINGS: Heart size within normal limits. Ill-defined opacities within the bilateral lung bases (greater on the left). No evidence of pleural effusion or pneumothorax. No acute bony abnormality identified. IMPRESSION: Ill-defined opacities within the left greater than right lung bases, which may reflect atelectasis (favored) or pneumonia. Electronically Signed   By: Kellie Simmering D.O.   On: 07/06/2021 15:21    Disposition   Pt is being discharged  home today in good condition.  Follow-up Plans & Appointments     Follow-up Information     Satira Sark, MD Follow up on 07/27/2021.   Specialty: Cardiology Why: Hospital Follow-up on 07/27/2021 at 11:40 AM. Will be at the [facility] office. Contact information: [place] [address] [phone]                Discharge Instructions     AMB Referral to Cardiac Rehabilitation - Phase II   Complete by: As directed    Diagnosis: NSTEMI   After initial evaluation and assessments completed: Virtual Based Care may be provided alone or in conjunction with Phase 2 Cardiac Rehab based on patient barriers.: Yes   Amb Referral to Cardiac Rehabilitation   Complete by: As directed    Diagnosis:  NSTEMI Coronary Stents     After initial evaluation and assessments completed: Virtual Based Care may be provided alone or in conjunction with Phase 2 Cardiac Rehab based on patient barriers.: Yes   Diet - low sodium heart healthy   Complete by: As directed    Discharge instructions   Complete by: As directed    PLEASE  REMEMBER TO BRING ALL OF YOUR MEDICATIONS TO [place].  PLEASE ATTEND ALL SCHEDULED FOLLOW-UP APPOINTMENTS.   Activity: Increase activity slowly as tolerated. You may shower, but no soaking baths (or swimming) for 1 week. No driving for 24 hours. No lifting over 10 lbs for 2 weeks. No sexual activity for 2 weeks.   You May Return to Work: in 1 week (if applicable)  Wound Care: You may wash cath site gently with soap and water. Keep cath site clean and dry. If you notice pain, swelling, bleeding or pus at your cath site, please call [phone].   Increase activity slowly   Complete by: As directed        Discharge Medications   Allergies as of 07/07/2021       Reactions   Nifedipine Other (See Comments), Palpitations   Heart races   Latex    Other Rash        Medication List     STOP taking these medications    amoxicillin-clavulanate 875-125 MG tablet Commonly known as: Augmentin   ibuprofen 600 MG tablet Commonly known as: ADVIL       TAKE these medications    Aimovig 70 MG/ML Soaj Generic drug: Erenumab-aooe Inject 70 mg as directed every 30 (thirty) days.   amitriptyline 50 MG tablet Commonly known as: ELAVIL Take 50 mg by mouth at bedtime.   aspirin 81 MG EC tablet Take 1 tablet (81 mg total) by mouth daily. Swallow whole. Start taking on: July 08, 2021   atorvastatin 80 MG tablet Commonly known as: LIPITOR Take 1 tablet (80 mg total) by mouth daily at 6 PM.   benzonatate 200 MG capsule Commonly known as: TESSALON Take 1 capsule (200 mg total) by mouth 3 (three) times daily as needed for cough.   chlorpheniramine-HYDROcodone 10-8 MG/5ML Suer Commonly known as: Tussionex Pennkinetic ER Take 5 mLs by mouth every 12 (twelve) hours as needed for cough.   cholecalciferol 25 MCG (1000 UNIT) tablet Commonly known as: VITAMIN D3 Take 1,000 Units by mouth daily.   cyclobenzaprine 10 MG tablet Commonly known as:  FLEXERIL Take 1 tablet by mouth at bedtime.   estradiol 0.1 MG/GM vaginal cream Commonly known as: ESTRACE   fluticasone 50 MCG/ACT nasal spray  Commonly known as: FLONASE Place 2 sprays into both nostrils daily.   isosorbide mononitrate 30 MG 24 hr tablet Commonly known as: IMDUR Take 1 tablet (30 mg total) by mouth daily.   losartan 100 MG tablet Commonly known as: COZAAR TAKE ONE TABLET BY MOUTH ONCE DAILY.   metoprolol tartrate 25 MG tablet Commonly known as: LOPRESSOR Take 1 tablet (25 mg total) by mouth 2 (two) times daily.   nitroGLYCERIN 0.4 MG SL tablet Commonly known as: NITROSTAT Place 1 tablet (0.4 mg total) under the tongue every 5 (five) minutes x 3 doses as needed for chest pain.   nystatin ointment Commonly known as: MYCOSTATIN APPLY TOPICALLY TOAAFFECTED AREA TWICE DAILY.   nystatin powder Commonly known as: MYCOSTATIN/NYSTOP as needed.   Premarin vaginal cream Generic drug: conjugated estrogens APPLY (0.5)MG VAGINALLY(THREE TIMES A WEEK FOR 2 WEEKS. THEN APPLY WEEKLY AS DIRECTED.   ticagrelor 90 MG Tabs tablet Commonly known as: BRILINTA Take 1 tablet (90 mg total) by mouth 2 (two) times daily.           Outstanding Labs/Studies   FLP and LFT's in 6-8 weeks.   Duration of Discharge Encounter   Greater than 30 minutes including physician time.  Signed, Erma Heritage, PA-C 07/07/2021, 12:46 PM   Attending Note:   The patient was seen and examined.  Agree with assessment and plan as noted above.  Changes made to the above note as needed.  Patient seen and independently examined with Bernerd Pho, Kerrick .   We discussed all aspects of the encounter. I agree with the assessment and plan as stated above.    CAD : s/p PCI yesterday for NSTEMI No CP,  seems to be doing well.  Cont ASA and plavix for at least 1 year   2.  Mitral regurgitation:   she has moderate regurgitation by echo in 2020.  Her mitral rotation might of worsened  slightly.  This will be addressed as an outpatient.  She still eats a fair amount of processed meats.  Of asked her to avoid salty foods and especially processed meats.  3.  Hyperlipidemia: Continue atorvastatin 80 mg a day.   I have spent a total of 40 minutes with patient reviewing hospital  notes , telemetry, EKGs, labs and examining patient as well as establishing an assessment and plan that was discussed with the patient.  > 50% of time was spent in direct patient care.    Thayer Headings, Brooke Bonito., MD, [facility] 07/07/2021, 12:46 PM [address]  [place] Pager [phone]

## 2021-07-07 NOTE — Plan of Care (Signed)
°  Problem: Clinical Measurements: Goal: Ability to maintain clinical measurements within normal limits will improve 07/07/2021 0530 by Colonel Bald, RN Outcome: Progressing 07/06/2021 2350 by Colonel Bald, RN Outcome: Progressing Goal: Will remain free from infection 07/07/2021 0530 by Colonel Bald, RN Outcome: Progressing 07/06/2021 2350 by Colonel Bald, RN Outcome: Progressing Goal: Diagnostic test results will improve 07/07/2021 0530 by Colonel Bald, RN Outcome: Progressing 07/06/2021 2350 by Colonel Bald, RN Outcome: Progressing Goal: Respiratory complications will improve 07/07/2021 0530 by Colonel Bald, RN Outcome: Progressing 07/06/2021 2350 by Colonel Bald, RN Outcome: Progressing Goal: Cardiovascular complication will be avoided 07/07/2021 0530 by Colonel Bald, RN Outcome: Progressing 07/06/2021 2350 by Colonel Bald, RN Outcome: Progressing

## 2021-07-09 ENCOUNTER — Encounter (HOSPITAL_COMMUNITY): Payer: Self-pay | Admitting: Cardiovascular Disease

## 2021-07-27 ENCOUNTER — Encounter: Payer: Self-pay | Admitting: Cardiology

## 2021-07-27 ENCOUNTER — Ambulatory Visit: Payer: Medicaid Other | Admitting: Cardiology

## 2021-07-27 ENCOUNTER — Ambulatory Visit: Payer: Medicaid Other | Admitting: Adult Health

## 2021-07-27 ENCOUNTER — Encounter: Payer: Self-pay | Admitting: Adult Health

## 2021-07-27 ENCOUNTER — Other Ambulatory Visit: Payer: Self-pay

## 2021-07-27 ENCOUNTER — Other Ambulatory Visit (HOSPITAL_COMMUNITY)
Admission: RE | Admit: 2021-07-27 | Discharge: 2021-07-27 | Disposition: A | Discharge status: Home / Self Care | Payer: Medicaid Other | Source: Ambulatory Visit | Attending: Adult Health | Admitting: Adult Health

## 2021-07-27 VITALS — BP 145/99 | HR 81 | Ht 68.0 in | Wt 202.0 lb

## 2021-07-27 VITALS — BP 140/90 | HR 68 | Ht 65.0 in | Wt 202.4 lb

## 2021-07-27 DIAGNOSIS — I493 Ventricular premature depolarization: Secondary | ICD-10-CM | POA: Diagnosis not present

## 2021-07-27 DIAGNOSIS — I340 Nonrheumatic mitral (valve) insufficiency: Secondary | ICD-10-CM

## 2021-07-27 DIAGNOSIS — Z124 Encounter for screening for malignant neoplasm of cervix: Secondary | ICD-10-CM | POA: Diagnosis not present

## 2021-07-27 DIAGNOSIS — I10 Essential (primary) hypertension: Secondary | ICD-10-CM | POA: Diagnosis not present

## 2021-07-27 DIAGNOSIS — R102 Pelvic and perineal pain: Secondary | ICD-10-CM

## 2021-07-27 DIAGNOSIS — I214 Non-ST elevation (NSTEMI) myocardial infarction: Secondary | ICD-10-CM | POA: Diagnosis not present

## 2021-07-27 DIAGNOSIS — I25119 Atherosclerotic heart disease of native coronary artery with unspecified angina pectoris: Secondary | ICD-10-CM

## 2021-07-27 MED ORDER — GABAPENTIN 300 MG PO CAPS: 300.0000 mg | ORAL_CAPSULE | Freq: Three times a day (TID) | ORAL | 1 refills | Status: DC

## 2021-07-27 NOTE — Progress Notes (Signed)
°  Subjective:     Patient ID: Anita Michael, female   DOB: [birth year], 56 y.o.   MRN: [id number]  HPI Anita Michael is a 56 year old white female, widowed, PM in complaining of vaginal pain on and off for about a year, and more constant since had MI 07/01/21. She is not sexually active.She uses estrogen cream once a week.  PCP is [facility].  Review of Systems +vaginal pain Not having sex Has to strain to void at times  Reviewed past medical,surgical, social and family history. Reviewed medications and allergies.     Objective:   Physical Exam BP (!) 145/99 (BP Location: Right Arm, Patient Position: Sitting, Cuff Size: Normal)    Pulse 81    Ht 5\' 8"  (1.727 m)    Wt 202 lb (91.6 kg)    BMI 30.71 kg/m     Skin warm and dry.Pelvic: external genitalia is normal in appearance no lesions, vagina: pale pink, fair moisture,has pain over entire vagina, Dr Elonda Husky in for co exam, urethra has no lesions or masses noted, cervix:smooth and bulbous, Pap with HR HPV genotyping performed,uterus: normal size, shape and contour, non tender, no masses felt, adnexa: no masses or tenderness noted. Bladder is non tender and no masses felt.  AA is 1 Fall risk is moderate Depression screen PHQ 2/9 07/27/2021  Decreased Interest 0  Down, Depressed, Hopeless 0  PHQ - 2 Score 0  Altered sleeping 0  Tired, decreased energy 3  Change in appetite 1  Feeling bad or failure about yourself  0  Trouble concentrating 0  Moving slowly or fidgety/restless 0  Suicidal thoughts 0  PHQ-9 Score 4    GAD 7 : Generalized Anxiety Score 07/27/2021  Nervous, Anxious, on Edge 0  Control/stop worrying 0  Worry too much - different things 0  Trouble relaxing 1  Restless 0  Easily annoyed or irritable 0  Afraid - awful might happen 0  Total GAD 7 Score 1      Upstream - 07/27/21 1028       Pregnancy Intention Screening   Does the patient want to become pregnant in the next year? N/A    Does the patient's partner want to become pregnant  in the next year? N/A    Would the patient like to discuss contraceptive options today? No      Contraception Wrap Up   Current Method Female Sterilization    End Method Female Sterilization    Contraception Counseling Provided No            Examination chaperoned by Marcelino Scot RN  Assessment:     1. Vaginal pain Will try gabapentin 300 mg tid Meds ordered this encounter  Medications   gabapentin (NEURONTIN) 300 MG capsule    Sig: Take 1 capsule (300 mg total) by mouth 3 (three) times daily.    Dispense:  90 capsule    Refill:  1    Order Specific Question:   Supervising Provider    Answer:   Elonda Husky, LUTHER H [[id number]]     2. Routine Papanicolaou smear Pap sent    Plan:     Follow up in 4 weeks, if not better may refer to PT

## 2021-07-27 NOTE — Progress Notes (Signed)
Cardiology Office Note  Date: 07/27/2021   ID: Anita Michael, DOB [birth year], MRN [id number]  PCP:  Gwenlyn Saran, [facility]  Cardiologist:  Rozann Lesches, MD Electrophysiologist:  None   Chief Complaint  Patient presents with   Hospitalization Follow-up    History of Present Illness: Anita Michael is a 56 y.o. female last seen in September 2022.  I reviewed interval records, she was hospitalized in December 2022 with NSTEMI, found to have severe single-vessel CAD involving the mid RCA and underwent placement of DES, otherwise with plan for medical therapy to reassess residual disease.  Peak high-sensitivity troponin I was 986.  Echocardiogram revealed LVEF 60 to 65% without regional wall motion abnormalities, also moderate to severe mitral regurgitation.  She is here for a follow-up visit.  States that she has been relatively fatigued, slowly improving however, she was unsteady when she first got home.  She has not been contacted by cardiac rehabilitation as yet.  She does not report any angina symptoms.  We went over her medications and she describes compliance with therapy.  We went over importance of uninterrupted dual antiplatelet therapy at this point.  Past Medical History:  Diagnosis Date   CAD (coronary artery disease)    a. s/p NSTEMI in 06/2021 with DES to mid-RCA. Residual disease along D1 and 1st Mrg with medical management recommended.   Essential hypertension    Fibromyalgia    Generalized headaches    History of stroke    Noted incidentally by brain MRI July 2022   Mitral regurgitation    a. moderate to severe by echo in 06/2021   Polycythemia    PVC's (premature ventricular contractions)     Past Surgical History:  Procedure Laterality Date   APPENDECTOMY     BREAST BIOPSY Right 2015   Fibroadenoma   CHOLECYSTECTOMY     CORONARY STENT INTERVENTION N/A 07/06/2021   Procedure: CORONARY STENT INTERVENTION;  Surgeon: Sherren Mocha, MD;   Location: [facility];  Service: Cardiovascular;  Laterality: N/A;   LEFT HEART CATH AND CORONARY ANGIOGRAPHY N/A 07/06/2021   Procedure: LEFT HEART CATH AND CORONARY ANGIOGRAPHY;  Surgeon: Sherren Mocha, MD;  Location: [facility];  Service: Cardiovascular;  Laterality: N/A;   TONSILLECTOMY      Current Outpatient Medications  Medication Sig Dispense Refill   AIMOVIG 70 MG/ML SOAJ Inject 70 mg as directed every 30 (thirty) days.     amitriptyline (ELAVIL) 50 MG tablet Take 50 mg by mouth at bedtime.     aspirin EC 81 MG EC tablet Take 1 tablet (81 mg total) by mouth daily. Swallow whole. 30 tablet 11   atorvastatin (LIPITOR) 80 MG tablet Take 1 tablet (80 mg total) by mouth daily at 6 PM. 90 tablet 1   benzonatate (TESSALON) 200 MG capsule Take 1 capsule (200 mg total) by mouth 3 (three) times daily as needed for cough. 30 capsule 0   chlorpheniramine-HYDROcodone (TUSSIONEX PENNKINETIC ER) 10-8 MG/5ML SUER Take 5 mLs by mouth every 12 (twelve) hours as needed for cough. 60 mL 0   cholecalciferol (VITAMIN D3) 25 MCG (1000 UT) tablet Take 1,000 Units by mouth daily.     cyclobenzaprine (FLEXERIL) 10 MG tablet Take 1 tablet by mouth at bedtime.     estradiol (ESTRACE) 0.1 MG/GM vaginal cream      fluticasone (FLONASE) 50 MCG/ACT nasal spray Place 2 sprays into both nostrils daily. 16 g 0   gabapentin (NEURONTIN) 300 MG capsule Take  1 capsule (300 mg total) by mouth 3 (three) times daily. 90 capsule 1   isosorbide mononitrate (IMDUR) 30 MG 24 hr tablet Take 1 tablet (30 mg total) by mouth daily. 90 tablet 3   lidocaine (XYLOCAINE) 5 % ointment Apply topically.     losartan (COZAAR) 100 MG tablet TAKE ONE TABLET BY MOUTH ONCE DAILY. 90 tablet 2   metoprolol tartrate (LOPRESSOR) 25 MG tablet Take 1 tablet (25 mg total) by mouth 2 (two) times daily. 180 tablet 1   nitroGLYCERIN (NITROSTAT) 0.4 MG SL tablet Place 1 tablet (0.4 mg total) under the tongue every 5 (five) minutes x 3  doses as needed for chest pain. 25 tablet 2   nystatin (MYCOSTATIN/NYSTOP) powder as needed.      nystatin ointment (MYCOSTATIN) APPLY TOPICALLY TOAAFFECTED AREA TWICE DAILY.     PREMARIN vaginal cream APPLY (0.5)MG VAGINALLY(THREE TIMES A WEEK FOR 2 WEEKS. THEN APPLY WEEKLY AS DIRECTED.     ticagrelor (BRILINTA) 90 MG TABS tablet Take 1 tablet (90 mg total) by mouth 2 (two) times daily. 180 tablet 3   No current facility-administered medications for this visit.   Allergies:  Nifedipine, Latex, and Other   ROS: No palpitations or syncope.  Physical Exam: VS:  BP 140/90    Pulse 68    Ht 5\' 5"  (1.651 m)    Wt 202 lb 6.4 oz (91.8 kg)    SpO2 97%    BMI 33.68 kg/m , BMI Body mass index is 33.68 kg/m.  Wt Readings from Last 3 Encounters:  07/27/21 202 lb 6.4 oz (91.8 kg)  07/27/21 202 lb (91.6 kg)  07/07/21 209 lb 1.6 oz (94.8 kg)    General: Patient appears comfortable at rest. HEENT: Conjunctiva and lids normal, wearing a mask. Neck: Supple, no elevated JVP or carotid bruits, no thyromegaly. Lungs: Clear to auscultation, nonlabored breathing at rest. Cardiac: Regular rate and rhythm, no S3, 2/6 systolic murmur, no pericardial rub. Extremities: No pitting edema.  ECG:  An ECG dated 07/06/2021 was personally reviewed today and demonstrated:  Sinus rhythm with right bundle branch block and PVCs.  Recent Labwork: 07/07/2021: BUN 5; Creatinine, Ser 0.94; Hemoglobin 13.6; Platelets 206; Potassium 4.5; Sodium 140     Component Value Date/Time   CHOL 195 07/07/2021 1028   TRIG 214 (H) 07/07/2021 1028   HDL 36 (L) 07/07/2021 1028   CHOLHDL 5.4 07/07/2021 1028   VLDL 43 (H) 07/07/2021 1028   LDLCALC 116 (H) 07/07/2021 1028    Other Studies Reviewed Today:  Cardiac catheterization 07/06/2021:   1st Diag lesion is 50% stenosed.   1st Mrg lesion is 50% stenosed.   Mid RCA lesion is 99% stenosed.   A drug-eluting stent was successfully placed using a STENT ONYX FRONTIER 3.5X18.    Post intervention, there is a 0% residual stenosis.   1.  Severe single-vessel coronary artery disease with critical stenosis of the mid RCA, treated successfully with PCI using a 3.5 x 18 mm resolute Onyx DES 2.  Mild nonobstructive diagonal and obtuse marginal stenoses 3.  Mildly elevated LVEDP   Recommend: Continue cangrelor x2 hours then stop, check 2D echocardiogram, post MI medical therapy.  Dual antiplatelet therapy with aspirin and ticagrelor x12 months without interruption.  Medical therapy for mild residual coronary artery disease.  Echocardiogram 07/07/2021:  1. Left ventricular ejection fraction, by estimation, is 60 to 65%. The  left ventricle has normal function. The left ventricle has no regional  wall motion abnormalities.  Left ventricular diastolic parameters were  normal.   2. Right ventricular systolic function is normal. The right ventricular  size is normal.   3. Left atrial size was moderately dilated.   4. The mitral valve is normal in structure. Moderate to severe mitral  valve regurgitation. No evidence of mitral stenosis.   5. The aortic valve is tricuspid. Aortic valve regurgitation is not  visualized. No aortic stenosis is present.   6. The inferior vena cava is dilated in size with <50% respiratory  variability, suggesting right atrial pressure of 15 mmHg.   Assessment and Plan:  1.  CAD status post NSTEMI in December 2022.  Culprit lesion involved the mid RCA which is now status post DES intervention, otherwise nonobstructive left system disease that is being managed medically.  LVEF 60 to 65% by echocardiography.  She is on aspirin, Brilinta, Lopressor, losartan, Imdur, and Lipitor.  Anticipate cardiac rehabilitation.  2.  Moderate to severe mitral regurgitation by echocardiogram in December 2022.  LVEF normal without wall motion abnormalities.  Question if mitral valve disease was worsened in the setting of ACS, previously mild to moderate.  We will consider  follow-up echocardiogram within the next 3 to 6 months.  3.  History of frequent PVCs, now on low-dose beta-blocker.  Continue to observe.  4.  Essential hypertension, blood pressure trend up recently.  Medications have been adjusted.  Continue to follow in case further titration necessary.  Medication Adjustments/Labs and Tests Ordered: Current medicines are reviewed at length with the patient today.  Concerns regarding medicines are outlined above.   Tests Ordered: No orders of the defined types were placed in this encounter.   Medication Changes: No orders of the defined types were placed in this encounter.   Disposition:  Follow up  4 to 6 weeks.  Signed, Satira Sark, MD, [facility] 07/27/2021 12:43 PM    [facility] [place], [place], [place] [address] Phone: [phone]; Fax: [phone]

## 2021-07-27 NOTE — Patient Instructions (Signed)
Medication Instructions:  Your physician recommends that you continue on your current medications as directed. Please refer to the Current Medication list given to you today.  Labwork: none  Testing/Procedures: none  Follow-Up: Your physician recommends that you schedule a follow-up appointment in: 4-6 weeks at the [place] office.  Any Other Special Instructions Will Be Listed Below (If Applicable).  If you need a refill on your cardiac medications before your next appointment, please call your pharmacy.

## 2021-07-30 LAB — CYTOLOGY - PAP
Adequacy: ABSENT
Comment: NEGATIVE
Diagnosis: NEGATIVE
High risk HPV: NEGATIVE

## 2021-08-15 ENCOUNTER — Telehealth: Payer: Self-pay | Admitting: Cardiology

## 2021-08-15 NOTE — Telephone Encounter (Signed)
° °  Pre-operative Risk Assessment    Patient Name: Anita Michael  DOB: [birth year] MRN: [id number]      Request for Surgical Clearance    Procedure:   REQUESTING MEDICAL TREATMENT FOR VYEPTI   Date of Surgery:  Clearance TBD                                 Surgeon:  Paralee Cancel, PHT Surgeon's Group or Practice Name:   [facility]  Phone number:  [phone] Fax number:  [phone]   Type of Clearance Requested:   - Medical    Type of Anesthesia:  Not Indicated   Additional requests/questions:    Rulon Eisenmenger   08/15/2021, 11:26 AM

## 2021-08-21 ENCOUNTER — Ambulatory Visit: Payer: Medicaid Other | Admitting: Adult Health

## 2021-08-21 ENCOUNTER — Other Ambulatory Visit: Payer: Self-pay

## 2021-08-21 ENCOUNTER — Encounter: Payer: Self-pay | Admitting: Adult Health

## 2021-08-21 ENCOUNTER — Encounter: Payer: Medicaid Other | Admitting: Adult Health

## 2021-08-21 VITALS — BP 102/75 | HR 90 | Ht 67.5 in | Wt 199.5 lb

## 2021-08-21 DIAGNOSIS — N816 Rectocele: Secondary | ICD-10-CM

## 2021-08-21 DIAGNOSIS — N393 Stress incontinence (female) (male): Secondary | ICD-10-CM | POA: Insufficient documentation

## 2021-08-21 DIAGNOSIS — K5900 Constipation, unspecified: Secondary | ICD-10-CM | POA: Diagnosis not present

## 2021-08-21 DIAGNOSIS — R102 Pelvic and perineal pain: Secondary | ICD-10-CM

## 2021-08-21 DIAGNOSIS — K649 Unspecified hemorrhoids: Secondary | ICD-10-CM

## 2021-08-21 NOTE — Progress Notes (Signed)
°  Subjective:     Patient ID: Anita Michael, female   DOB: July 01, 1966, 56 y.o.   MRN: [id number]  HPI Anita Michael is 56 year old white female, widowed, PM back in follow up on taking gabapentin for vaginal pain and has helped some, can rest better at night. Has urinary incontinence if sneezes and is constipated, is on meds for that, and has hemorrhoids, she says has fibromyalgia flare today.  Lab Results  Component Value Date   DIAGPAP  07/27/2021    - Negative for intraepithelial lesion or malignancy (NILM)   [place] Negative 07/27/2021   PCP is [facility].  Review of Systems Still has vaginal pain, a little better with gabapentin,can rest at night  Has urinary incontinence with sneezing  +constipated Has hemorrhoids She says has fibromyalgia flare now, try chiropractor, said massage helped but too expensive   Dizzy today Reviewed past medical,surgical, social and family history. Reviewed medications and allergies.  Objective:   Physical Exam BP 102/75 (BP Location: Left Arm, Patient Position: Sitting, Cuff Size: Normal)    Pulse 90    Ht 5' 7.5" (1.715 m)    Wt 199 lb 8 oz (90.5 kg)    BMI 30.78 kg/m     Skin warm and dry.Pelvic: external genitalia is normal in appearance no lesions, vagina: pale with loss of rugae, slight posterior bulge, she says is tender, urethra has no lesions or masses noted, cervix:smooth, uterus: normal size, shape and contour, non tender, no masses felt, adnexa: no masses or tenderness noted. Bladder is non tender and no masses felt.  Unable to do rectal exam has hemorrhoids Examination chaperoned by Levy Pupa LPN Fall risk is lo  [facility] - 08/21/21 0948       Pregnancy Intention Screening   Does the patient want to become pregnant in the next year? N/A    Does the patient's partner want to become pregnant in the next year? N/A    Would the patient like to discuss contraceptive options today? N/A      Contraception Wrap Up   Current Method Female Sterilization    postmenopausal   End Method Female Sterilization   postmenopausal   Contraception Counseling Provided No             Assessment:     1. Vaginal pain Continue gabapentin Will refer to uro/gynecology  for evaluation     2. SUI (stress urinary incontinence, female) Will refer to uro/gynecology   3. Rectocele  4. Constipation, unspecified constipation type She is taking meds she says   5. Hemorrhoids, unspecified hemorrhoid type She has cream    Plan:     Follow up in 4 weeks, if desired

## 2021-08-23 ENCOUNTER — Ambulatory Visit: Payer: Medicaid Other | Admitting: Adult Health

## 2021-08-30 NOTE — Progress Notes (Signed)
Cardiology Office Note    Date:  09/10/2021   ID:  Anita Michael, DOB [birth year], MRN [id number]   PCP:  [facility]  Cardiologist:  Rozann Lesches, MD   Advanced Practice Provider:  No care team member to display Electrophysiologist:  None   [id number]   No chief complaint on file.   History of Present Illness:  Anita Michael is a 56 y.o. female  with history of CAD NSTEMI 06/2021 DES RCA, nonobstructive disease medical management.normal LVEF 60-65%, Mod-severe MR-plan to repeat 3-6 months, HTN, HLD.  Patient saw Dr. Domenic Polite 07/27/21 doing well.  Plan to repeat echo  3-6 months.  Note 08/15/21 requesting clearance for VYepti by Dr. Paralee Cancel, PHT with [facility] for migraines but not to be used with recent cardiac event. Patient says BP goes up on occasion, not related to migraines. No extra salt. Also has some dizziness at times related to some of her meds. No chest pain, dyspnea, palpitations, edema. Walks a little but limited by fibromyalgia. Smokes less than a pack a month when she's stressed.    Past Medical History:  Diagnosis Date   CAD (coronary artery disease)    a. s/p NSTEMI in 06/2021 with DES to mid-RCA. Residual disease along D1 and 1st Mrg with medical management recommended.   Essential hypertension    Fibromyalgia    Generalized headaches    History of stroke    Noted incidentally by brain MRI July 2022   Mitral regurgitation    a. moderate to severe by echo in 06/2021   Polycythemia    PVC's (premature ventricular contractions)     Past Surgical History:  Procedure Laterality Date   APPENDECTOMY     BREAST BIOPSY Right 2015   Fibroadenoma   CHOLECYSTECTOMY     CORONARY STENT INTERVENTION N/A 07/06/2021   Procedure: CORONARY STENT INTERVENTION;  Surgeon: Sherren Mocha, MD;  Location: [facility];  Service: Cardiovascular;  Laterality: N/A;   LEFT HEART  CATH AND CORONARY ANGIOGRAPHY N/A 07/06/2021   Procedure: LEFT HEART CATH AND CORONARY ANGIOGRAPHY;  Surgeon: Sherren Mocha, MD;  Location: [facility];  Service: Cardiovascular;  Laterality: N/A;   TONSILLECTOMY      Current Medications: Current Meds  Medication Sig   AIMOVIG 70 MG/ML SOAJ Inject 70 mg as directed every 30 (thirty) days.   amitriptyline (ELAVIL) 50 MG tablet Take 50 mg by mouth at bedtime.   aspirin EC 81 MG EC tablet Take 1 tablet (81 mg total) by mouth daily. Swallow whole.   atorvastatin (LIPITOR) 80 MG tablet Take 1 tablet (80 mg total) by mouth daily at 6 PM.   cholecalciferol (VITAMIN D3) 25 MCG (1000 UT) tablet Take 1,000 Units by mouth daily.   cyclobenzaprine (FLEXERIL) 10 MG tablet Take 1 tablet by mouth at bedtime.   estradiol (ESTRACE) 0.1 MG/GM vaginal cream    fluticasone (FLONASE) 50 MCG/ACT nasal spray Place 2 sprays into both nostrils daily.   gabapentin (NEURONTIN) 300 MG capsule Take 1 capsule (300 mg total) by mouth 3 (three) times daily.   isosorbide mononitrate (IMDUR) 30 MG 24 hr tablet Take 1 tablet (30 mg total) by mouth daily.   lidocaine (XYLOCAINE) 5 % ointment Apply topically as needed.   losartan (COZAAR) 100 MG tablet TAKE ONE TABLET BY MOUTH ONCE DAILY.   metoprolol tartrate (LOPRESSOR) 25 MG tablet Take 1 tablet (25 mg total) by mouth  2 (two) times daily.   nitroGLYCERIN (NITROSTAT) 0.4 MG SL tablet Place 1 tablet (0.4 mg total) under the tongue every 5 (five) minutes x 3 doses as needed for chest pain.   nystatin (MYCOSTATIN/NYSTOP) powder as needed.    nystatin ointment (MYCOSTATIN) APPLY TOPICALLY TOAAFFECTED AREA TWICE DAILY.   PREMARIN vaginal cream APPLY (0.5)MG VAGINALLY(THREE TIMES A WEEK FOR 2 WEEKS. THEN APPLY WEEKLY AS DIRECTED.   ticagrelor (BRILINTA) 90 MG TABS tablet Take 1 tablet (90 mg total) by mouth 2 (two) times daily.     Allergies:   Nifedipine, Latex, and Other   Social History   Socioeconomic History    Marital status: Widowed    Spouse name: Not on file   Number of children: 6   Years of education: Not on file   Highest education level: Not on file  Occupational History   Not on file  Tobacco Use   Smoking status: Some Days    Packs/day: 0.50    Types: Cigarettes    Last attempt to quit: 02/07/2019    Years since quitting: 2.5   Smokeless tobacco: Never  Vaping Use   Vaping Use: Never used  Substance and Sexual Activity   Alcohol use: No   Drug use: No   Sexual activity: Not Currently    Birth control/protection: Post-menopausal  Other Topics Concern   Not on file  Social History Narrative   ** Merged History Encounter **       Social Determinants of Health   Financial Resource Strain: Medium Risk   Difficulty of Paying Living Expenses: Somewhat hard  Food Insecurity: No Food Insecurity   Worried About Charity fundraiser in the Last Year: Never true   Ran Out of Food in the Last Year: Never true  Transportation Needs: No Transportation Needs   Lack of Transportation (Medical): No   Lack of Transportation (Non-Medical): No  Physical Activity: Inactive   Days of Exercise per Week: 0 days   Minutes of Exercise per Session: 0 min  Stress: No Stress Concern Present   Feeling of Stress : Not at all  Social Connections: Moderately Isolated   Frequency of Communication with Friends and Family: Twice a week   Frequency of Social Gatherings with Friends and Family: Once a week   Attends Religious Services: 1 to 4 times per year   Active Member of [employer] or Organizations: No   Attends Archivist Meetings: Never   Marital Status: Widowed     Family History:  The patient's  family history includes Breast cancer (age of onset: 16) in her sister; Breast cancer (age of onset: 36) in her sister; Breast cancer (age of onset: 39) in her mother; Breast cancer (age of onset: 38) in her maternal grandmother.   ROS:   Please see the history of present illness.    ROS All  other systems reviewed and are negative.   PHYSICAL EXAM:   VS:  BP 114/78    Pulse 84    Ht 5\' 8"  (1.727 m)    Wt 197 lb 12.8 oz (89.7 kg)    BMI 30.08 kg/m   Physical Exam  GEN: Obese, in no acute distress  Neck: no JVD, carotid bruits, or masses Cardiac:RRR; no murmurs, rubs, or gallops  Respiratory:  clear to auscultation bilaterally, normal work of breathing GI: soft, nontender, nondistended, + BS Ext: without cyanosis, clubbing, or edema, Good distal pulses bilaterally Neuro:  Alert and Oriented x 3 Psych:  euthymic mood, flat affect  Wt Readings from Last 3 Encounters:  09/10/21 197 lb 12.8 oz (89.7 kg)  08/21/21 199 lb 8 oz (90.5 kg)  07/27/21 202 lb 6.4 oz (91.8 kg)      Studies/Labs Reviewed:   EKG:  EKG is not ordered today.   Recent Labs: 07/07/2021: BUN 5; Creatinine, Ser 0.94; Hemoglobin 13.6; Platelets 206; Potassium 4.5; Sodium 140   Lipid Panel    Component Value Date/Time   CHOL 195 07/07/2021 1028   TRIG 214 (H) 07/07/2021 1028   HDL 36 (L) 07/07/2021 1028   CHOLHDL 5.4 07/07/2021 1028   VLDL 43 (H) 07/07/2021 1028   LDLCALC 116 (H) 07/07/2021 1028    Additional studies/ records that were reviewed today include:  Cardiac catheterization 07/06/2021:   1st Diag lesion is 50% stenosed.   1st Mrg lesion is 50% stenosed.   Mid RCA lesion is 99% stenosed.   A drug-eluting stent was successfully placed using a STENT ONYX FRONTIER 3.5X18.   Post intervention, there is a 0% residual stenosis.   1.  Severe single-vessel coronary artery disease with critical stenosis of the mid RCA, treated successfully with PCI using a 3.5 x 18 mm resolute Onyx DES 2.  Mild nonobstructive diagonal and obtuse marginal stenoses 3.  Mildly elevated LVEDP   Recommend: Continue cangrelor x2 hours then stop, check 2D echocardiogram, post MI medical therapy.  Dual antiplatelet therapy with aspirin and ticagrelor x12 months without interruption.  Medical therapy for mild residual  coronary artery disease.   Echocardiogram 07/07/2021:  1. Left ventricular ejection fraction, by estimation, is 60 to 65%. The  left ventricle has normal function. The left ventricle has no regional  wall motion abnormalities. Left ventricular diastolic parameters were  normal.   2. Right ventricular systolic function is normal. The right ventricular  size is normal.   3. Left atrial size was moderately dilated.   4. The mitral valve is normal in structure. Moderate to severe mitral  valve regurgitation. No evidence of mitral stenosis.   5. The aortic valve is tricuspid. Aortic valve regurgitation is not  visualized. No aortic stenosis is present.   6. The inferior vena cava is dilated in size with <50% respiratory  variability, suggesting right atrial pressure of 15 mmHg.      Risk Assessment/Calculations:         ASSESSMENT:    1. Coronary artery disease involving native coronary artery of native heart without angina pectoris   2. Mitral valve disorder   3. Primary hypertension   4. Mixed hyperlipidemia      PLAN:  In order of problems listed above:  Preop clearance for VYEPTI infucion by Dr. Paralee Cancel [facility]. This drug is contraindicated in patients with recent cardiac events so I can't clear her for this infusion for migraines. She just had an NSTEMI in 06/2021.  CAD NSTEMI 06/2021 DES RCA, nonobstructive disease medical management.normal LVEF 60-65%-no angina. No regular exercise because of fibromyalgia. To start cardiac rehab next month. Continue ASA/Brilinta/metoprolol/losartan/Imdur  Mod-severe MR-plan to repeat the end of march  HTN BP up when stressed but also having dizziness from meds-she's not sure which ones. She will continue to monitor.  HLD will f/u [facility] when she gets her echo.  Tobacco abuse-says she smokes less than a pack/month but smells of smoke. Cessation discussed.    Shared Decision Making/Informed Consent         Medication Adjustments/Labs and Tests Ordered: Current medicines  are reviewed at length with the patient today.  Concerns regarding medicines are outlined above.  Medication changes, Labs and Tests ordered today are listed in the Patient Instructions below. Patient Instructions  Medication Instructions:  Imdur (Isosorbide) refilled today.  Continue all current medications.  Labwork: none  Testing/Procedures: Your physician has requested that you have an echocardiogram. Echocardiography is a painless test that uses sound waves to create images of your heart. It provides your doctor with information about the size and shape of your heart and how well your hearts chambers and valves are working. This procedure takes approximately one hour. There are no restrictions for this procedure - [facility] Office will contact with results via phone or letter.     Follow-Up: 4-5 months   Any Other Special Instructions Will Be Listed Below (If Applicable).   If you need a refill on your cardiac medications before your next appointment, please call your pharmacy.    Sumner Boast, PA-C  09/10/2021 2:33 PM    [facility] [place], [place], [place]  [address] Phone: [phone]; Fax: [phone]

## 2021-08-31 ENCOUNTER — Encounter (HOSPITAL_COMMUNITY): Admission: RE | Admit: 2021-08-31 | Payer: Medicaid Other | Source: Ambulatory Visit

## 2021-09-10 ENCOUNTER — Other Ambulatory Visit: Payer: Self-pay

## 2021-09-10 ENCOUNTER — Encounter: Payer: Self-pay | Admitting: Physician Assistant

## 2021-09-10 ENCOUNTER — Ambulatory Visit: Payer: Medicaid Other | Admitting: Physician Assistant

## 2021-09-10 VITALS — BP 114/78 | HR 84 | Ht 68.0 in | Wt 197.8 lb

## 2021-09-10 DIAGNOSIS — I10 Essential (primary) hypertension: Secondary | ICD-10-CM | POA: Diagnosis not present

## 2021-09-10 DIAGNOSIS — I059 Rheumatic mitral valve disease, unspecified: Secondary | ICD-10-CM | POA: Diagnosis not present

## 2021-09-10 DIAGNOSIS — I2510 Atherosclerotic heart disease of native coronary artery without angina pectoris: Secondary | ICD-10-CM | POA: Diagnosis not present

## 2021-09-10 DIAGNOSIS — E782 Mixed hyperlipidemia: Secondary | ICD-10-CM

## 2021-09-10 DIAGNOSIS — Z01818 Encounter for other preprocedural examination: Secondary | ICD-10-CM

## 2021-09-10 MED ORDER — ISOSORBIDE MONONITRATE ER 30 MG PO TB24: 30.0000 mg | ORAL_TABLET | Freq: Every day | ORAL | 3 refills | Status: DC

## 2021-09-10 NOTE — Patient Instructions (Addendum)
Medication Instructions:  Imdur (Isosorbide) refilled today.  Continue all current medications.  Labwork: [facility] - order given today. Please do at time of your Echo Reminder:  Nothing to eat or drink after 12 midnight prior to labs.  Testing/Procedures: Your physician has requested that you have an echocardiogram. Echocardiography is a painless test that uses sound waves to create images of your heart. It provides your doctor with information about the size and shape of your heart and how well your hearts chambers and valves are working. This procedure takes approximately one hour. There are no restrictions for this procedure - [facility] Office will contact with results via phone or letter.     Follow-Up: 4-5 months   Any Other Special Instructions Will Be Listed Below (If Applicable). Work on smoking cessation.   If you need a refill on your cardiac medications before your next appointment, please call your pharmacy.

## 2021-09-18 ENCOUNTER — Ambulatory Visit: Payer: Medicaid Other | Admitting: Adult Health

## 2021-09-25 ENCOUNTER — Ambulatory Visit: Payer: Medicaid Other | Admitting: Adult Health

## 2021-09-25 ENCOUNTER — Encounter: Payer: Self-pay | Admitting: Adult Health

## 2021-09-25 ENCOUNTER — Other Ambulatory Visit: Payer: Self-pay

## 2021-09-25 VITALS — BP 143/85 | HR 85 | Ht 67.5 in | Wt 192.6 lb

## 2021-09-25 DIAGNOSIS — R102 Pelvic and perineal pain: Secondary | ICD-10-CM

## 2021-09-25 NOTE — Progress Notes (Signed)
?  Subjective:  ?  ? Patient ID: Anita Michael, female   DOB: [birth year], 56 y.o.   MRN: [id number] ? ?HPI ?Anita Michael is a 56 year old white female, widowed. PM back in follow up on vaginal pain, that she has had over a year. ?She stopped gabapentin, says it did not help that much. She has not seen Dr Wannetta Sender yet. ?PCP is [facility], in [place]. ? ?Lab Results  ?Component Value Date  ? DIAGPAP  07/27/2021  ?  - Negative for intraepithelial lesion or malignancy (NILM)  ? [place] Negative 07/27/2021  ?  ?Review of Systems ?Vaginal pain, not bad today ?Reviewed past medical,surgical, social and family history. Reviewed medications and allergies.  ?   ?Objective:  ? Physical Exam ?BP (!) 143/85 (BP Location: Right Arm, Patient Position: Sitting, Cuff Size: Normal)   Pulse 85   Ht 5' 7.5" (1.715 m)   Wt 192 lb 9.6 oz (87.4 kg)   BMI 29.72 kg/m?   ?  Skin warm and dry.Lungs: clear to ausculation bilaterally. Cardiovascular: regular rate and rhythm.  ?She declines pelvic exam ? [facility] - 09/25/21 8270   ? ?  ? Pregnancy Intention Screening  ? Does the patient want to become pregnant in the next year? N/A   ? Does the patient's partner want to become pregnant in the next year? N/A   ? Would the patient like to discuss contraceptive options today? N/A   ?  ? Contraception Wrap Up  ? Current Method No Method - Other Reason   Postmenopausal  ? End Method No Method - Other Reason   ? Contraception Counseling Provided No   ? ?  ?  ? ?  ?  ?Assessment:  ?   ?1. Vaginal pain ?Has appt with Dr Wannetta Sender 10/29/21  ?  She stopped gabapentin she says, it did not really help ? ?Plan:  ?   ?Follow up prn  ?   ?

## 2021-09-27 ENCOUNTER — Encounter (HOSPITAL_COMMUNITY): Payer: Self-pay

## 2021-09-27 ENCOUNTER — Encounter (HOSPITAL_COMMUNITY)
Admission: RE | Admit: 2021-09-27 | Discharge: 2021-09-27 | Disposition: A | Discharge status: Home / Self Care | Payer: Medicaid Other | Source: Ambulatory Visit | Attending: Cardiovascular Disease | Admitting: Cardiovascular Disease

## 2021-09-27 ENCOUNTER — Other Ambulatory Visit: Payer: Self-pay

## 2021-09-27 VITALS — BP 132/84 | HR 80 | Ht 67.5 in | Wt 191.8 lb

## 2021-09-27 DIAGNOSIS — Z955 Presence of coronary angioplasty implant and graft: Secondary | ICD-10-CM | POA: Diagnosis present

## 2021-09-27 DIAGNOSIS — I214 Non-ST elevation (NSTEMI) myocardial infarction: Secondary | ICD-10-CM

## 2021-09-27 NOTE — Progress Notes (Signed)
Cardiac Individual Treatment Plan  Patient Details  Name: Anita Michael MRN: [id number] Date of Birth: January 03, 1966 Referring Provider:   Flowsheet Row CARDIAC REHAB PHASE II ORIENTATION from 09/27/2021 in [place]  Referring Provider Dr. Audie Box       Initial Encounter Date:  Flowsheet Row CARDIAC REHAB PHASE II ORIENTATION from 09/27/2021 in [place]  Date 10/04/21       Visit Diagnosis: NSTEMI (non-ST elevated myocardial infarction) [facility])  Status post coronary artery stent placement  Patient's Home Medications on Admission:  Current Outpatient Medications:    acetaminophen (TYLENOL) 500 MG tablet, Take 1,000 mg by mouth every 6 (six) hours as needed (for pain.)., Disp: , Rfl:    AIMOVIG 140 MG/ML SOAJ, Inject 140 mg into the skin every 30 (thirty) days., Disp: , Rfl:    amitriptyline (ELAVIL) 50 MG tablet, Take 50 mg by mouth at bedtime., Disp: , Rfl:    aspirin EC 81 MG EC tablet, Take 1 tablet (81 mg total) by mouth daily. Swallow whole., Disp: 30 tablet, Rfl: 11   atorvastatin (LIPITOR) 80 MG tablet, Take 1 tablet (80 mg total) by mouth daily at 6 PM., Disp: 90 tablet, Rfl: 1   cetirizine (ZYRTEC) 10 MG tablet, Take 10 mg by mouth daily as needed for allergies., Disp: , Rfl:    cholecalciferol (VITAMIN D3) 25 MCG (1000 UT) tablet, Take 1,000 Units by mouth daily in the afternoon., Disp: , Rfl:    cyclobenzaprine (FLEXERIL) 10 MG tablet, Take 10 mg by mouth 2 (two) times daily as needed for muscle spasms., Disp: , Rfl:    fluticasone (FLONASE) 50 MCG/ACT nasal spray, Place 2 sprays into both nostrils daily. (Patient taking differently: Place 2 sprays into both nostrils daily as needed for allergies.), Disp: 16 g, Rfl: 0   isosorbide mononitrate (IMDUR) 30 MG 24 hr tablet, Take 1 tablet (30 mg total) by mouth daily. (Patient taking differently: Take 30 mg by mouth daily in the afternoon.), Disp: 90 tablet, Rfl: 3   lidocaine  (XYLOCAINE) 5 % ointment, Apply 1 application. topically as needed (pain.)., Disp: , Rfl:    losartan (COZAAR) 100 MG tablet, TAKE ONE TABLET BY MOUTH ONCE DAILY., Disp: 90 tablet, Rfl: 2   metoprolol tartrate (LOPRESSOR) 25 MG tablet, Take 1 tablet (25 mg total) by mouth 2 (two) times daily., Disp: 180 tablet, Rfl: 1   nitroGLYCERIN (NITROSTAT) 0.4 MG SL tablet, Place 1 tablet (0.4 mg total) under the tongue every 5 (five) minutes x 3 doses as needed for chest pain., Disp: 25 tablet, Rfl: 2   nystatin (MYCOSTATIN/NYSTOP) powder, 1 application. 2 (two) times daily as needed (skin irritation)., Disp: , Rfl:    nystatin ointment (MYCOSTATIN), Apply 1 application. topically once a week., Disp: , Rfl:    PREMARIN vaginal cream, Place 1 application. vaginally once a week., Disp: , Rfl:    ticagrelor (BRILINTA) 90 MG TABS tablet, Take 1 tablet (90 mg total) by mouth 2 (two) times daily., Disp: 180 tablet, Rfl: 3   gabapentin (NEURONTIN) 300 MG capsule, Take 1 capsule (300 mg total) by mouth 3 (three) times daily. (Patient not taking: Reported on 09/25/2021), Disp: 90 capsule, Rfl: 1  Past Medical History: Past Medical History:  Diagnosis Date   CAD (coronary artery disease)    a. s/p NSTEMI in 06/2021 with DES to mid-RCA. Residual disease along D1 and 1st Mrg with medical management recommended.   Essential hypertension    Fibromyalgia  Generalized headaches    History of stroke    Noted incidentally by brain MRI July 2022   Mitral regurgitation    a. moderate to severe by echo in 06/2021   Polycythemia    PVC's (premature ventricular contractions)     Tobacco Use: Social History   Tobacco Use  Smoking Status Some Days   Packs/day: 0.50   Types: Cigarettes   Last attempt to quit: 02/07/2019   Years since quitting: 2.6  Smokeless Tobacco Never    Labs: Recent Review Flowsheet Data     Labs for ITP Cardiac and Pulmonary Rehab Latest Ref Rng & Units 07/06/2021 07/07/2021 07/07/2021    Cholestrol 0 - 200 mg/dL - 180 195   LDLCALC 0 - 99 mg/dL - 100(H) 116(H)   HDL >40 mg/dL - 34(L) 36(L)   Trlycerides <150 mg/dL - 232(H) 214(H)   Hemoglobin A1c 4.8 - 5.6 % - 6.2(H) -   PHART 7.350 - 7.450 7.275(L) - -   PCO2ART 32.0 - 48.0 mmHg 55.9(H) - -   HCO3 20.0 - 28.0 mmol/L 25.9 - -   TCO2 22 - 32 mmol/L 28 - -   ACIDBASEDEF 0.0 - 2.0 mmol/L 2.0 - -   O2SAT % 78.0 - -       Capillary Blood Glucose: No results found for: GLUCAP   Exercise Target Goals: Exercise Program Goal: Individual exercise prescription set using results from initial 6 min walk test and THRR while considering  patients activity barriers and safety.   Exercise Prescription Goal: Starting with aerobic activity 30 plus minutes a day, 3 days per week for initial exercise prescription. Provide home exercise prescription and guidelines that participant acknowledges understanding prior to discharge.  Activity Barriers & Risk Stratification:  Activity Barriers & Cardiac Risk Stratification - 09/27/21 1326       Activity Barriers & Cardiac Risk Stratification   Activity Barriers Fibromyalgia;Deconditioning;Muscular Weakness;Shortness of Breath;History of Falls    Cardiac Risk Stratification High             6 Minute Walk:  6 Minute Walk     Row Name 09/27/21 1418         6 Minute Walk   Phase Initial     Distance 700 feet     Walk Time 6 minutes     # of Rest Breaks 1     MPH 1.32     METS 2.26     RPE 13     VO2 Peak 7.9     Symptoms Yes (comment)     Comments one standing rest break for 30 seconds due to being tired     Resting HR 80 bpm     Resting BP 132/84     Resting Oxygen Saturation  95 %     Exercise Oxygen Saturation  during 6 min walk 98 %     Max Ex. HR 87 bpm     Max Ex. BP 160/80     2 Minute Post BP 138/80              Oxygen Initial Assessment:   Oxygen Re-Evaluation:   Oxygen Discharge (Final Oxygen Re-Evaluation):   Initial Exercise  Prescription:  Initial Exercise Prescription - 09/27/21 1400       Date of Initial Exercise RX and Referring Provider   Date 10/04/21    Referring Provider Dr. Audie Box    Expected Discharge Date 12/21/21      NuStep   Level  1    SPM 60    Minutes 22      Arm Ergometer   Level 1    RPM 60    Minutes 17      Prescription Details   Frequency (times per week) 3    Duration Progress to 30 minutes of continuous aerobic without signs/symptoms of physical distress      Intensity   THRR 40-80% of Max Heartrate 65-131    Ratings of Perceived Exertion 11-13    Perceived Dyspnea 0-4      Resistance Training   Training Prescription Yes    Weight 2    Reps 10-15             Perform Capillary Blood Glucose checks as needed.  Exercise Prescription Changes:   Exercise Comments:   Exercise Goals and Review:   Exercise Goals     Row Name 09/27/21 1422             Exercise Goals   Increase Physical Activity Yes       Intervention Provide advice, education, support and counseling about physical activity/exercise needs.;Develop an individualized exercise prescription for aerobic and resistive training based on initial evaluation findings, risk stratification, comorbidities and participant's personal goals.       Expected Outcomes Short Term: Attend rehab on a regular basis to increase amount of physical activity.;Long Term: Add in home exercise to make exercise part of routine and to increase amount of physical activity.;Long Term: Exercising regularly at least 3-5 days a week.       Increase Strength and Stamina Yes       Intervention Provide advice, education, support and counseling about physical activity/exercise needs.;Develop an individualized exercise prescription for aerobic and resistive training based on initial evaluation findings, risk stratification, comorbidities and participant's personal goals.       Expected Outcomes Short Term: Increase workloads from initial  exercise prescription for resistance, speed, and METs.;Short Term: Perform resistance training exercises routinely during rehab and add in resistance training at home;Long Term: Improve cardiorespiratory fitness, muscular endurance and strength as measured by increased METs and functional capacity (6MWT)       Able to understand and use rate of perceived exertion (RPE) scale Yes       Intervention Provide education and explanation on how to use RPE scale       Expected Outcomes Short Term: Able to use RPE daily in rehab to express subjective intensity level;Long Term:  Able to use RPE to guide intensity level when exercising independently       Knowledge and understanding of Target Heart Rate Range (THRR) Yes       Intervention Provide education and explanation of THRR including how the numbers were predicted and where they are located for reference       Expected Outcomes Short Term: Able to state/look up THRR;Short Term: Able to use daily as guideline for intensity in rehab;Long Term: Able to use THRR to govern intensity when exercising independently       Able to check pulse independently Yes       Intervention Provide education and demonstration on how to check pulse in carotid and radial arteries.;Review the importance of being able to check your own pulse for safety during independent exercise       Expected Outcomes Short Term: Able to explain why pulse checking is important during independent exercise;Long Term: Able to check pulse independently and accurately       Understanding  of Exercise Prescription Yes       Intervention Provide education, explanation, and written materials on patient's individual exercise prescription       Expected Outcomes Short Term: Able to explain program exercise prescription;Long Term: Able to explain home exercise prescription to exercise independently                Exercise Goals Re-Evaluation :    Discharge Exercise Prescription (Final Exercise  Prescription Changes):   Nutrition:  Target Goals: Understanding of nutrition guidelines, daily intake of sodium '1500mg'$ , cholesterol '200mg'$ , calories 30% from fat and 7% or less from saturated fats, daily to have 5 or more servings of fruits and vegetables.  Biometrics:  Pre Biometrics - 09/27/21 1422       Pre Biometrics   Height 5' 7.5" (1.715 m)    Weight 191 lb 12.8 oz (87 kg)    Waist Circumference 48 inches    Hip Circumference 40 inches    Waist to Hip Ratio 1.2 %    BMI (Calculated) 29.58    Triceps Skinfold 18 mm    % Body Fat 41 %    Grip Strength 14.3 kg    Flexibility 0 in    Single Leg Stand 0 seconds              Nutrition Therapy Plan and Nutrition Goals:  Nutrition Therapy & Goals - 09/27/21 1336       Intervention Plan   Intervention Nutrition handout(s) given to patient.    Expected Outcomes Short Term Goal: Understand basic principles of dietary content, such as calories, fat, sodium, cholesterol and nutrients.             Nutrition Assessments:  Nutrition Assessments - 09/27/21 1337       MEDFICTS Scores   Pre Score 18            MEDIFICTS Score Key: ?70 Need to make dietary changes  40-70 Heart Healthy Diet ? 40 Therapeutic Level Cholesterol Diet   Picture Your Plate Scores: <67 Unhealthy dietary pattern with much room for improvement. 41-50 Dietary pattern unlikely to meet recommendations for good health and room for improvement. 51-60 More healthful dietary pattern, with some room for improvement.  >60 Healthy dietary pattern, although there may be some specific behaviors that could be improved.    Nutrition Goals Re-Evaluation:   Nutrition Goals Discharge (Final Nutrition Goals Re-Evaluation):   Psychosocial: Target Goals: Acknowledge presence or absence of significant depression and/or stress, maximize coping skills, provide positive support system. Participant is able to verbalize types and ability to use  techniques and skills needed for reducing stress and depression.  Initial Review & Psychosocial Screening:  Initial Psych Review & Screening - 09/27/21 1334       Initial Review   Current issues with Current Stress Concerns    Source of Stress Concerns Financial    Comments She is stressed about finances, as she is currently out of work due to her health.      Family Dynamics   Good Support System? Yes    Comments Her children and her sister are her main support system.      Barriers   Psychosocial barriers to participate in program There are no identifiable barriers or psychosocial needs.      Screening Interventions   Interventions Encouraged to exercise    Expected Outcomes Long Term goal: The participant improves quality of Life and PHQ9 Scores as seen by post scores and/or verbalization  of changes;Short Term goal: Identification and review with participant of any Quality of Life or Depression concerns found by scoring the questionnaire.             Quality of Life Scores:  Quality of Life - 09/27/21 1423       Quality of Life   Select Quality of Life      Quality of Life Scores   Health/Function Pre 19.28 %    Socioeconomic Pre 28.5 %    Psych/Spiritual Pre 22.8 %    Family Pre 27 %    GLOBAL Pre 22.78 %            Scores of 19 and below usually indicate a poorer quality of life in these areas.  A difference of  2-3 points is a clinically meaningful difference.  A difference of 2-3 points in the total score of the Quality of Life Index has been associated with significant improvement in overall quality of life, self-image, physical symptoms, and general health in studies assessing change in quality of life.  PHQ-9: Recent Review Flowsheet Data     Depression screen [facility] 2/9 09/27/2021 07/27/2021   Decreased Interest 0 0   Down, Depressed, Hopeless 0 0   PHQ - 2 Score 0 0   Altered sleeping 0 0   Tired, decreased energy 3 3   Change in appetite 0 1   Feeling  bad or failure about yourself  0 0   Trouble concentrating 0 0   Moving slowly or fidgety/restless 0 0   Suicidal thoughts 0 0   PHQ-9 Score 3 4   Difficult doing work/chores Very difficult -      Interpretation of Total Score  Total Score Depression Severity:  1-4 = Minimal depression, 5-9 = Mild depression, 10-14 = Moderate depression, 15-19 = Moderately severe depression, 20-27 = Severe depression   Psychosocial Evaluation and Intervention:  Psychosocial Evaluation - 09/27/21 1404       Psychosocial Evaluation & Interventions   Interventions Encouraged to exercise with the program and follow exercise prescription    Comments Pt has no barriers to participating in CR. She has no identifiable psychosocial issues. She does report finances as a stressor, as she is currently out of work since her NSTEMI and stent. She reports stress especially around the time that her bills are due. She states that she has cut back on her smoking. She previously smoked about a half a pack per day. She states that she now goes through a pack per month. She states that she only smokes when she is stressed. However, she does currently smell like cigarettes. It is unclear if this is from her, or if she has been around someone who smokes. She scored a 3 on her PHQ-9 due to her lack of energy. She states since her NSTEMI that she has not had any energy and it has been hard for her to do anything. She reports that the months of 10-03-2022 and March are hard for her. Her husband passed away 5 years ago in 2022/10/03 and his birthday is in March. She is currently raising her 12 year old grandson, who she adopted when he was 73 year old. She reports that he has autism and this can be challenging for her at times. She reports that she has a good support system with her sister and her children. She has lost about 30 lbs since her NSTEMI due to eating better. She reports that her goals are  to continue to lose weight, decrease her SOB  with exertion, and to gain enough energy to be able to go back to work. She is optimistic about the program, but does note that her fibromyalgia and frequent migraine headaches may impact her ability to complete the program. She is hopeful that she will be able to.    Expected Outcomes Pt will continue to have no identifiable psychosocial issues and her stressors will be handled in a healthy manner.    Continue Psychosocial Services  No Follow up required             Psychosocial Re-Evaluation:   Psychosocial Discharge (Final Psychosocial Re-Evaluation):   Vocational Rehabilitation: Provide vocational rehab assistance to qualifying candidates.   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            Education: Education Goals: Education classes will be provided on a weekly basis, covering required topics. Participant will state understanding/return demonstration of topics presented.  Learning Barriers/Preferences:  Learning Barriers/Preferences - 09/27/21 1338       Learning Barriers/Preferences   Learning Barriers None    Learning Preferences Audio;Computer/Internet;Group Instruction;Individual Instruction;Pictoral;Skilled Demonstration;Verbal Instruction;Video;Written Material             Education Topics: Hypertension, Hypertension Reduction -Define heart disease and high blood pressure. Discus how high blood pressure affects the body and ways to reduce high blood pressure.   Exercise and Your Heart -Discuss why it is important to exercise, the FITT principles of exercise, normal and abnormal responses to exercise, and how to exercise safely.   Angina -Discuss definition of angina, causes of angina,  treatment of angina, and how to decrease risk of having angina.   Cardiac Medications -Review what the following cardiac medications are used for, how they affect the body, and side effects that may occur when taking the medications.  Medications include Aspirin, Beta blockers, calcium channel blockers, ACE Inhibitors, angiotensin receptor blockers, diuretics, digoxin, and antihyperlipidemics.   Congestive Heart Failure -Discuss the definition of CHF, how to live with CHF, the signs and symptoms of CHF, and how keep track of weight and sodium intake.   Heart Disease and Intimacy -Discus the effect sexual activity has on the heart, how changes occur during intimacy as we age, and safety during sexual activity.   Smoking Cessation / COPD -Discuss different methods to quit smoking, the health benefits of quitting smoking, and the definition of COPD.   Nutrition I: Fats -Discuss the types of cholesterol, what cholesterol does to the heart, and how cholesterol levels can be controlled.   Nutrition II: Labels -Discuss the different components of food labels and how to read food label   Heart Parts/Heart Disease and PAD -Discuss the anatomy of the heart, the pathway of blood circulation through the heart, and these are affected by heart disease.   Stress I: Signs and Symptoms -Discuss the causes of stress, how stress may lead to anxiety and depression, and ways to limit stress.   Stress II: Relaxation -Discuss different types of relaxation techniques to limit stress.   Warning Signs of Stroke / TIA -Discuss definition of a stroke, what the signs and symptoms are of a stroke, and how to identify when someone is  having stroke.   Knowledge Questionnaire Score:  Knowledge Questionnaire Score - 09/27/21 1338       Knowledge Questionnaire Score   Pre Score 14/24             Core Components/Risk Factors/Patient Goals at Admission:  Personal Goals and Risk Factors at  Admission - 09/27/21 1343       Core Components/Risk Factors/Patient Goals on Admission    Weight Management Yes;Weight Loss;Weight Maintenance    Intervention Weight Management: Develop a combined nutrition and exercise program designed to reach desired caloric intake, while maintaining appropriate intake of nutrient and fiber, sodium and fats, and appropriate energy expenditure required for the weight goal.;Weight Management: Provide education and appropriate resources to help participant work on and attain dietary goals.;Weight Management/Obesity: Establish reasonable short term and long term weight goals.;Obesity: Provide education and appropriate resources to help participant work on and attain dietary goals.    Expected Outcomes Short Term: Continue to assess and modify interventions until short term weight is achieved;Long Term: Adherence to nutrition and physical activity/exercise program aimed toward attainment of established weight goal;Weight Maintenance: Understanding of the daily nutrition guidelines, which includes 25-35% calories from fat, 7% or less cal from saturated fats, less than '200mg'$  cholesterol, less than 1.5gm of sodium, & 5 or more servings of fruits and vegetables daily;Understanding recommendations for meals to include 15-35% energy as protein, 25-35% energy from fat, 35-60% energy from carbohydrates, less than '200mg'$  of dietary cholesterol, 20-35 gm of total fiber daily;Weight Loss: Understanding of general recommendations for a balanced deficit meal plan, which promotes 1-2 lb weight loss per week and includes a negative energy balance of [phone] kcal/d;Understanding of distribution of calorie intake throughout the day with the consumption of 4-5 meals/snacks    Tobacco Cessation Yes    Number of packs per day 1 pack per month    Intervention Assist the participant in steps to quit. Provide individualized education and counseling about committing to Tobacco Cessation, relapse  prevention, and pharmacological support that can be provided by physician.;Advice worker, assist with locating and accessing local/national Quit Smoking programs, and support quit date choice.    Expected Outcomes Short Term: Will demonstrate readiness to quit, by selecting a quit date.;Short Term: Will quit all tobacco product use, adhering to prevention of relapse plan.;Long Term: Complete abstinence from all tobacco products for at least 12 months from quit date.    Improve shortness of breath with ADL's Yes    Intervention Provide education, individualized exercise plan and daily activity instruction to help decrease symptoms of SOB with activities of daily living.    Expected Outcomes Short Term: Improve cardiorespiratory fitness to achieve a reduction of symptoms when performing ADLs;Long Term: Be able to perform more ADLs without symptoms or delay the onset of symptoms    Personal Goal Other Yes    Personal Goal Return to work    Intervention Attend cardiac rehab and begin a home exercise program.    Expected Outcomes Her energy levels will improve to the point where she can return to work.             Core Components/Risk Factors/Patient Goals Review:    Core Components/Risk Factors/Patient Goals at Discharge (Final Review):    ITP Comments:   Comments: Patient arrived for 1st visit/orientation/education at 1230. Patient was referred to CR by Dr. Audie Box due to NSTEMI (I21.4) and S/P coronary artery stent placement (Z95.5). During orientation advised patient on arrival and appointment times what to  wear, what to do before, during and after exercise. Reviewed attendance and class policy.  Pt is scheduled to return Cardiac Rehab on 10/01/2021 at 1100. Pt was advised to come to class 15 minutes before class starts.  Discussed RPE/Dpysnea scales. Patient participated in warm up stretches. Patient was able to complete 6 minute walk test.  Telemetry:NSR with frequent PVC's.  Patient was measured for the equipment. Discussed equipment safety with patient. Took patient pre-anthropometric measurements. Patient finished visit at 1430.

## 2021-10-01 ENCOUNTER — Encounter (HOSPITAL_COMMUNITY)
Admission: RE | Admit: 2021-10-01 | Discharge: 2021-10-01 | Disposition: A | Discharge status: Home / Self Care | Payer: Medicaid Other | Source: Ambulatory Visit | Attending: Cardiovascular Disease | Admitting: Cardiovascular Disease

## 2021-10-01 VITALS — Wt 193.3 lb

## 2021-10-01 DIAGNOSIS — Z955 Presence of coronary angioplasty implant and graft: Secondary | ICD-10-CM

## 2021-10-01 DIAGNOSIS — I214 Non-ST elevation (NSTEMI) myocardial infarction: Secondary | ICD-10-CM

## 2021-10-01 NOTE — Progress Notes (Signed)
Daily Session Note ? ?Patient Details  ?Name: Anita Michael ?MRN: [id number] ?Date of Birth: 09/26/1965 ?Referring Provider:   ?Flowsheet Row CARDIAC REHAB PHASE II ORIENTATION from 09/27/2021 in [place]  ?Referring Provider Dr. Audie Box  ? ?  ? ? ?Encounter Date: 10/01/2021 ? ?Check In: ? Session Check In - 10/01/21 1119   ? ?  ? Check-In  ? Supervising physician immediately available to respond to emergencies [facility] MD immediately available   ? Physician(s) Dr. Johney Frame   ? Location [facility]   ? Staff Present Hoy Register, MS, ACSM-CEP, Exercise Physiologist;Phyllis Billingsley, RN;Debra Wynetta Emery, RN, BSN;Other   Daphyne Hassell Done RN  ? Virtual Visit No   ? Medication changes reported     No   ? Fall or balance concerns reported    Yes   ? Comments Pt has fallen twice in the past year due to feeling dizzy and her legs giving out. She reports feeling dizzy frequently.   ? Tobacco Cessation Use Decreased   ? Warm-up and Cool-down Performed as group-led instruction   ? Resistance Training Performed Yes   ? VAD Patient? No   ? PAD/SET Patient? No   ?  ? Pain Assessment  ? Currently in Pain? No/denies   ? Multiple Pain Sites No   ? ?  ?  ? ?  ? ? ?Capillary Blood Glucose: ?No results found for this or any previous visit (from the past 24 hour(s)). ? ? ? ?Social History  ? ?Tobacco Use  ?Smoking Status Some Days  ? Packs/day: 0.50  ? Types: Cigarettes  ? Last attempt to quit: 02/07/2019  ? Years since quitting: 2.6  ?Smokeless Tobacco Never  ? ? ?Goals Met:  ?Independence with exercise equipment ?Exercise tolerated well ?No report of concerns or symptoms today ?Strength training completed today ? ?Goals Unmet:  ?Not Applicable ? ?Comments: checkout time is 1200 ? ? ?Dr. Carlyle Dolly is Medical Director for [place] ?

## 2021-10-03 ENCOUNTER — Encounter (HOSPITAL_COMMUNITY): Payer: Medicaid Other

## 2021-10-05 ENCOUNTER — Encounter (HOSPITAL_COMMUNITY)
Admission: RE | Admit: 2021-10-05 | Discharge: 2021-10-05 | Disposition: A | Discharge status: Home / Self Care | Payer: Medicaid Other | Source: Ambulatory Visit | Attending: Cardiovascular Disease | Admitting: Cardiovascular Disease

## 2021-10-05 DIAGNOSIS — I214 Non-ST elevation (NSTEMI) myocardial infarction: Secondary | ICD-10-CM

## 2021-10-05 DIAGNOSIS — Z955 Presence of coronary angioplasty implant and graft: Secondary | ICD-10-CM

## 2021-10-05 NOTE — Progress Notes (Signed)
Daily Session Note ? ?Patient Details  ?Name: Anita Michael ?MRN: [id number] ?Date of Birth: 1965-09-11 ?Referring Provider:   ?Flowsheet Row CARDIAC REHAB PHASE II ORIENTATION from 09/27/2021 in [place]  ?Referring Provider Dr. Audie Box  ? ?  ? ? ?Encounter Date: 10/05/2021 ? ?Check In: ? Session Check In - 10/05/21 1055   ? ?  ? Check-In  ? Supervising physician immediately available to respond to emergencies [facility] MD immediately available   ? Physician(s) Dr Harl Bowie   ? Location [facility]   ? Staff Present Geanie Cooley, RN;Debra Wynetta Emery, RN, Joanette Gula, RN, Madlyn Frankel, RN, BSN   ? Virtual Visit No   ? Medication changes reported     No   ? Fall or balance concerns reported    Yes   ? Comments Pt has fallen twice in the past year due to feeling dizzy and her legs giving out. She reports feeling dizzy frequently.   ? Tobacco Cessation Use Decreased   ? Warm-up and Cool-down Performed as group-led instruction   ? Resistance Training Performed Yes   ? VAD Patient? No   ? PAD/SET Patient? No   ?  ? Pain Assessment  ? Currently in Pain? No/denies   ? Multiple Pain Sites No   ? ?  ?  ? ?  ? ? ?Capillary Blood Glucose: ?No results found for this or any previous visit (from the past 24 hour(s)). ? ? ? ?Social History  ? ?Tobacco Use  ?Smoking Status Some Days  ? Packs/day: 0.50  ? Types: Cigarettes  ? Last attempt to quit: 02/07/2019  ? Years since quitting: 2.6  ?Smokeless Tobacco Never  ? ? ?Goals Met:  ?Independence with exercise equipment ?Exercise tolerated well ?No report of concerns or symptoms today ?Strength training completed today ? ?Goals Unmet:  ?Not Applicable ? ?Comments: checkout 1200 ? ? ? ?Dr. Carlyle Dolly is Medical Director for [place] ?

## 2021-10-08 ENCOUNTER — Encounter (HOSPITAL_COMMUNITY): Payer: Medicaid Other

## 2021-10-10 ENCOUNTER — Encounter (HOSPITAL_COMMUNITY)
Admission: RE | Admit: 2021-10-10 | Discharge: 2021-10-10 | Disposition: A | Discharge status: Home / Self Care | Payer: Medicaid Other | Source: Ambulatory Visit | Attending: Cardiovascular Disease | Admitting: Cardiovascular Disease

## 2021-10-10 DIAGNOSIS — I214 Non-ST elevation (NSTEMI) myocardial infarction: Secondary | ICD-10-CM

## 2021-10-10 DIAGNOSIS — Z955 Presence of coronary angioplasty implant and graft: Secondary | ICD-10-CM

## 2021-10-10 NOTE — Progress Notes (Signed)
Daily Session Note ? ?Patient Details  ?Name: Anita Michael ?MRN: [id number] ?Date of Birth: 06-03-1966 ?Referring Provider:   ?Flowsheet Row CARDIAC REHAB PHASE II ORIENTATION from 09/27/2021 in [place]  ?Referring Provider Dr. Audie Box  ? ?  ? ? ?Encounter Date: 10/10/2021 ? ?Check In: ? Session Check In - 10/10/21 1057   ? ?  ? Check-In  ? Supervising physician immediately available to respond to emergencies [facility] MD immediately available   ? Physician(s) Dr. Domenic Polite   ? Location [facility]   ? Staff Present Geanie Cooley, RN;Heather Otho Ket, BS, Exercise Physiologist;Dalton Kris Mouton, MS, ACSM-CEP, Exercise Physiologist   ? Virtual Visit No   ? Medication changes reported     No   ? Fall or balance concerns reported    Yes   ? Comments Pt has fallen twice in the past year due to feeling dizzy and her legs giving out. She reports feeling dizzy frequently.   ? Tobacco Cessation No Change   ? Warm-up and Cool-down Performed as group-led instruction   ? Resistance Training Performed Yes   ? VAD Patient? No   ? PAD/SET Patient? No   ?  ? Pain Assessment  ? Currently in Pain? No/denies   ? Multiple Pain Sites No   ? ?  ?  ? ?  ? ? ?Capillary Blood Glucose: ?No results found for this or any previous visit (from the past 24 hour(s)). ? ? ? ?Social History  ? ?Tobacco Use  ?Smoking Status Some Days  ? Packs/day: 0.50  ? Types: Cigarettes  ? Last attempt to quit: 02/07/2019  ? Years since quitting: 2.6  ?Smokeless Tobacco Never  ? ? ?Goals Met:  ?Independence with exercise equipment ?Exercise tolerated well ?No report of concerns or symptoms today ?Strength training completed today ? ?Goals Unmet:  ?Not Applicable ? ?Comments: check out @ 12:00pm ? ? ?Dr. Carlyle Dolly is Medical Director for [place] ?

## 2021-10-12 ENCOUNTER — Encounter (HOSPITAL_COMMUNITY): Admission: RE | Admit: 2021-10-12 | Payer: Medicaid Other | Source: Ambulatory Visit

## 2021-10-15 ENCOUNTER — Encounter (HOSPITAL_COMMUNITY)
Admission: RE | Admit: 2021-10-15 | Discharge: 2021-10-15 | Disposition: A | Discharge status: Home / Self Care | Payer: Medicaid Other | Source: Ambulatory Visit | Attending: Cardiovascular Disease | Admitting: Cardiovascular Disease

## 2021-10-15 ENCOUNTER — Ambulatory Visit (HOSPITAL_COMMUNITY)
Admission: RE | Admit: 2021-10-15 | Discharge: 2021-10-15 | Disposition: A | Discharge status: Home / Self Care | Payer: Medicaid Other | Source: Ambulatory Visit | Attending: Physician Assistant | Admitting: Physician Assistant

## 2021-10-15 VITALS — Wt 191.8 lb

## 2021-10-15 DIAGNOSIS — I214 Non-ST elevation (NSTEMI) myocardial infarction: Secondary | ICD-10-CM

## 2021-10-15 DIAGNOSIS — I340 Nonrheumatic mitral (valve) insufficiency: Secondary | ICD-10-CM | POA: Diagnosis not present

## 2021-10-15 DIAGNOSIS — Z955 Presence of coronary angioplasty implant and graft: Secondary | ICD-10-CM

## 2021-10-15 DIAGNOSIS — I059 Rheumatic mitral valve disease, unspecified: Secondary | ICD-10-CM | POA: Insufficient documentation

## 2021-10-15 LAB — ECHOCARDIOGRAM COMPLETE
Area-P 1/2: 3.85 cm2
S' Lateral: 3.9 cm
Weight: 3068.8 oz

## 2021-10-15 NOTE — Progress Notes (Signed)
Daily Session Note ? ?Patient Details  ?Name: Anita Michael ?MRN: [id number] ?Date of Birth: [birth year] ?Referring Provider:   ?Flowsheet Row CARDIAC REHAB PHASE II ORIENTATION from 09/27/2021 in [place]  ?Referring Provider Dr. Audie Box  ? ?  ? ? ?Encounter Date: 10/15/2021 ? ?Check In: ? Session Check In - 10/15/21 1058   ? ?  ? Check-In  ? Supervising physician immediately available to respond to emergencies [facility] MD immediately available   ? Physician(s) Dr Marlou Porch   ? Location [facility]   ? Staff Present Madelyn Flavors, RN, Bjorn Loser, MS, ACSM-CEP, Exercise Physiologist;Heather Zigmund Daniel, Exercise Physiologist   ? Virtual Visit No   ? Medication changes reported     No   ? Fall or balance concerns reported    Yes   ? Comments Pt has fallen twice in the past year due to feeling dizzy and her legs giving out. She reports feeling dizzy frequently.   ? Tobacco Cessation No Change   ? Warm-up and Cool-down Performed as group-led instruction   ? Resistance Training Performed Yes   ? VAD Patient? No   ? PAD/SET Patient? No   ?  ? Pain Assessment  ? Currently in Pain? No/denies   ? Multiple Pain Sites No   ? ?  ?  ? ?  ? ? ?Capillary Blood Glucose: ?No results found for this or any previous visit (from the past 24 hour(s)). ? ? ? ?Social History  ? ?Tobacco Use  ?Smoking Status Some Days  ? Packs/day: 0.50  ? Types: Cigarettes  ? Last attempt to quit: 02/07/2019  ? Years since quitting: 2.6  ?Smokeless Tobacco Never  ? ? ?Goals Met:  ?Independence with exercise equipment ?Exercise tolerated well ?No report of concerns or symptoms today ?Strength training completed today ? ?Goals Unmet:  ?Not Applicable ? ?Comments: Check out at 1200 ? ? ?Dr. Carlyle Dolly is Medical Director for [place] ?

## 2021-10-15 NOTE — Progress Notes (Signed)
*  PRELIMINARY RESULTS* ?Echocardiogram ?2D Echocardiogram has been performed. ? ?Anita Michael ?10/15/2021, 1:52 PM ?

## 2021-10-17 ENCOUNTER — Telehealth: Payer: Self-pay | Admitting: Physician Assistant

## 2021-10-17 ENCOUNTER — Encounter (HOSPITAL_COMMUNITY): Payer: Medicaid Other

## 2021-10-17 NOTE — Telephone Encounter (Signed)
Echo results discussed with patient. PCP copied. ?

## 2021-10-17 NOTE — Progress Notes (Signed)
Cardiac Individual Treatment Plan ? ?Patient Details  ?Name: Anita Michael ?MRN: [id number] ?Date of Birth: 1966-04-17 ?Referring Provider:   ?Flowsheet Row CARDIAC REHAB PHASE II ORIENTATION from 09/27/2021 in [place]  ?Referring Provider Dr. Audie Box  ? ?  ? ? ?Initial Encounter Date:  ?Flowsheet Row CARDIAC REHAB PHASE II ORIENTATION from 09/27/2021 in [place]  ?Date 10/04/21  ? ?  ? ? ?Visit Diagnosis: NSTEMI (non-ST elevated myocardial infarction) ([place]) ? ?Status post coronary artery stent placement ? ?Patient's Home Medications on Admission: ? ?Current Outpatient Medications:  ?  acetaminophen (TYLENOL) 500 MG tablet, Take 1,000 mg by mouth every 6 (six) hours as needed (for pain.)., Disp: , Rfl:  ?  AIMOVIG 140 MG/ML SOAJ, Inject 140 mg into the skin every 30 (thirty) days., Disp: , Rfl:  ?  amitriptyline (ELAVIL) 50 MG tablet, Take 50 mg by mouth at bedtime., Disp: , Rfl:  ?  aspirin EC 81 MG EC tablet, Take 1 tablet (81 mg total) by mouth daily. Swallow whole., Disp: 30 tablet, Rfl: 11 ?  atorvastatin (LIPITOR) 80 MG tablet, Take 1 tablet (80 mg total) by mouth daily at 6 PM., Disp: 90 tablet, Rfl: 1 ?  cetirizine (ZYRTEC) 10 MG tablet, Take 10 mg by mouth daily as needed for allergies., Disp: , Rfl:  ?  cholecalciferol (VITAMIN D3) 25 MCG (1000 UT) tablet, Take 1,000 Units by mouth daily in the afternoon., Disp: , Rfl:  ?  cyclobenzaprine (FLEXERIL) 10 MG tablet, Take 10 mg by mouth 2 (two) times daily as needed for muscle spasms., Disp: , Rfl:  ?  fluticasone (FLONASE) 50 MCG/ACT nasal spray, Place 2 sprays into both nostrils daily. (Patient taking differently: Place 2 sprays into both nostrils daily as needed for allergies.), Disp: 16 g, Rfl: 0 ?  gabapentin (NEURONTIN) 300 MG capsule, Take 1 capsule (300 mg total) by mouth 3 (three) times daily. (Patient not taking: Reported on 09/25/2021), Disp: 90 capsule, Rfl: 1 ?  isosorbide mononitrate (IMDUR) 30 MG 24  hr tablet, Take 1 tablet (30 mg total) by mouth daily. (Patient taking differently: Take 30 mg by mouth daily in the afternoon.), Disp: 90 tablet, Rfl: 3 ?  lidocaine (XYLOCAINE) 5 % ointment, Apply 1 application. topically as needed (pain.)., Disp: , Rfl:  ?  losartan (COZAAR) 100 MG tablet, TAKE ONE TABLET BY MOUTH ONCE DAILY., Disp: 90 tablet, Rfl: 2 ?  metoprolol tartrate (LOPRESSOR) 25 MG tablet, Take 1 tablet (25 mg total) by mouth 2 (two) times daily., Disp: 180 tablet, Rfl: 1 ?  nitroGLYCERIN (NITROSTAT) 0.4 MG SL tablet, Place 1 tablet (0.4 mg total) under the tongue every 5 (five) minutes x 3 doses as needed for chest pain., Disp: 25 tablet, Rfl: 2 ?  nystatin (MYCOSTATIN/NYSTOP) powder, 1 application. 2 (two) times daily as needed (skin irritation)., Disp: , Rfl:  ?  nystatin ointment (MYCOSTATIN), Apply 1 application. topically once a week., Disp: , Rfl:  ?  PREMARIN vaginal cream, Place 1 application. vaginally once a week., Disp: , Rfl:  ?  ticagrelor (BRILINTA) 90 MG TABS tablet, Take 1 tablet (90 mg total) by mouth 2 (two) times daily., Disp: 180 tablet, Rfl: 3 ? ?Past Medical History: ?Past Medical History:  ?Diagnosis Date  ? CAD (coronary artery disease)   ? a. s/p NSTEMI in 06/2021 with DES to mid-RCA. Residual disease along D1 and 1st Mrg with medical management recommended.  ? Essential hypertension   ? Fibromyalgia   ?  Generalized headaches   ? History of stroke   ? Noted incidentally by brain MRI July 2022  ? Mitral regurgitation   ? a. moderate to severe by echo in 06/2021  ? Polycythemia   ? PVC's (premature ventricular contractions)   ? ? ?Tobacco Use: ?Social History  ? ?Tobacco Use  ?Smoking Status Some Days  ? Packs/day: 0.50  ? Types: Cigarettes  ? Last attempt to quit: 02/07/2019  ? Years since quitting: 2.6  ?Smokeless Tobacco Never  ? ? ?Labs: ?Review Flowsheet   ? ?  ?  Latest Ref Rng & Units 07/06/2021 07/07/2021  ?Labs for [facility] Cardiac and Pulmonary Rehab  ?Cholestrol 0 - 200 mg/dL   195    ? 180    ?LDL (calc) 0 - 99 mg/dL  116    ? 100    ?HDL-C >40 mg/dL  36    ? 34    ?Trlycerides <150 mg/dL  214    ? 232    ?Hemoglobin A1c 4.8 - 5.6 %  6.2    ?PH, Arterial 7.350 - 7.450 7.275     ?PCO2 arterial 32.0 - 48.0 mmHg 55.9     ?Bicarbonate 20.0 - 28.0 mmol/L 25.9     ?TCO2 22 - 32 mmol/L 28     ?Acid-base deficit 0.0 - 2.0 mmol/L 2.0     ?O2 Saturation % 78.0     ?  ? ? Multiple values from one day are sorted in reverse-chronological order  ?  ?  ? ? ?Capillary Blood Glucose: ?No results found for: GLUCAP ? ? ?Exercise Target Goals: ?Exercise Program Goal: ?Individual exercise prescription set using results from initial 6 min walk test and THRR while considering  patient?s activity barriers and safety.  ? ?Exercise Prescription Goal: ?Starting with aerobic activity 30 plus minutes a day, 3 days per week for initial exercise prescription. Provide home exercise prescription and guidelines that participant acknowledges understanding prior to discharge. ? ?Activity Barriers & Risk Stratification: ? Activity Barriers & Cardiac Risk Stratification - 09/27/21 1326   ? ?  ? Activity Barriers & Cardiac Risk Stratification  ? Activity Barriers Fibromyalgia;Deconditioning;Muscular Weakness;Shortness of Breath;History of Falls   ? Cardiac Risk Stratification High   ? ?  ?  ? ?  ? ? ?6 Minute Walk: ? 6 Minute Walk   ? ? [place] Name 09/27/21 1418  ?  ?  ?  ? 6 Minute Walk  ? Phase Initial    ? Distance 700 feet    ? Walk Time 6 minutes    ? # of Rest Breaks 1    ? MPH 1.32    ? METS 2.26    ? RPE 13    ? VO2 Peak 7.9    ? Symptoms Yes (comment)    ? Comments one standing rest break for 30 seconds due to being tired    ? Resting HR 80 bpm    ? Resting BP 132/84    ? Resting Oxygen Saturation  95 %    ? Exercise Oxygen Saturation  during 6 min walk 98 %    ? Max Ex. HR 87 bpm    ? Max Ex. BP 160/80    ? 2 Minute Post BP 138/80    ? ?  ?  ? ?  ? ? ?Oxygen Initial Assessment: ? ? ?Oxygen Re-Evaluation: ? ? ?Oxygen  Discharge (Final Oxygen Re-Evaluation): ? ? ?Initial Exercise Prescription: ? Initial Exercise Prescription - 09/27/21  1400   ? ?  ? Date of Initial Exercise RX and Referring Provider  ? Date 10/04/21   ? Referring Provider Dr. Audie Box   ? Expected Discharge Date 12/21/21   ?  ? NuStep  ? Level 1   ? SPM 60   ? Minutes 22   ?  ? Arm Ergometer  ? Level 1   ? RPM 60   ? Minutes 17   ?  ? Prescription Details  ? Frequency (times per week) 3   ? Duration Progress to 30 minutes of continuous aerobic without signs/symptoms of physical distress   ?  ? Intensity  ? THRR 40-80% of Max Heartrate 65-131   ? Ratings of Perceived Exertion 11-13   ? Perceived Dyspnea 0-4   ?  ? Resistance Training  ? Training Prescription Yes   ? Weight 2   ? Reps 10-15   ? ?  ?  ? ?  ? ? ?Perform Capillary Blood Glucose checks as needed. ? ?Exercise Prescription Changes: ? ? Exercise Prescription Changes   ? ? [place] Name 10/01/21 1156 10/15/21 1200  ?  ?  ?  ?  ? Response to Exercise  ? Blood Pressure (Admit) 122/90 122/84     ? Blood Pressure (Exercise) 140/96 106/70     ? Blood Pressure (Exit) 126/88 118/76     ? Heart Rate (Admit) 89 bpm 110 bpm     ? Heart Rate (Exercise) 101 bpm 119 bpm     ? Heart Rate (Exit) 94 bpm 100 bpm     ? Rating of Perceived Exertion (Exercise) 13 12     ? Duration Continue with 30 min of aerobic exercise without signs/symptoms of physical distress. Continue with 30 min of aerobic exercise without signs/symptoms of physical distress.     ? Intensity THRR unchanged THRR unchanged     ?  ? Progression  ? Progression Continue to progress workloads to maintain intensity without signs/symptoms of physical distress. Continue to progress workloads to maintain intensity without signs/symptoms of physical distress.     ?  ? Resistance Training  ? Training Prescription Yes Yes     ? Weight 2 2     ? Reps 10-15 10-15     ? Time 10 Minutes 10 Minutes     ?  ? NuStep  ? Level 1 1     ? SPM 54 62     ? Minutes 22 22     ? METs 1.73  1.76     ?  ? Arm Ergometer  ? Level 1 1     ? RPM 32 58     ? Minutes 17 17     ? METs 1.48 1.36     ? ?  ?  ? ?  ? ? ?Exercise Comments: ? ? ?Exercise Goals and Review: ? ? Exercise Goals   ? ? Row N

## 2021-10-17 NOTE — Telephone Encounter (Signed)
Patient returned call for test results.  °

## 2021-10-19 ENCOUNTER — Encounter (HOSPITAL_COMMUNITY): Payer: Medicaid Other

## 2021-10-22 ENCOUNTER — Encounter (HOSPITAL_COMMUNITY)
Admission: RE | Admit: 2021-10-22 | Discharge: 2021-10-22 | Disposition: A | Discharge status: Home / Self Care | Payer: Medicaid Other | Source: Ambulatory Visit | Attending: Cardiovascular Disease | Admitting: Cardiovascular Disease

## 2021-10-22 DIAGNOSIS — Z955 Presence of coronary angioplasty implant and graft: Secondary | ICD-10-CM | POA: Insufficient documentation

## 2021-10-22 DIAGNOSIS — I214 Non-ST elevation (NSTEMI) myocardial infarction: Secondary | ICD-10-CM | POA: Insufficient documentation

## 2021-10-22 NOTE — Progress Notes (Signed)
I have reviewed a Home Exercise Prescription with Anita Michael . Anita Michael is not currently exercising at home.  The patient was advised to walk 2 days a week for 30-45 minutes.  Anita Michael and I discussed how to progress their exercise prescription.  The patient stated that their goals were build back her energy and endurance.  The patient stated that they understand the exercise prescription.  We reviewed exercise guidelines, target heart rate during exercise, RPE Scale, weather conditions, NTG use, endpoints for exercise, warmup and cool down.  Patient is encouraged to come to me with any questions. I will continue to follow up with the patient to assist them with progression and safety.    ?

## 2021-10-22 NOTE — Progress Notes (Signed)
Daily Session Note ? ?Patient Details  ?Name: Anita Michael ?MRN: [id number] ?Date of Birth: 1966-05-21 ?Referring Provider:   ?Flowsheet Row CARDIAC REHAB PHASE II ORIENTATION from 09/27/2021 in [place]  ?Referring Provider Dr. Audie Box  ? ?  ? ? ?Encounter Date: 10/22/2021 ? ?Check In: ? Session Check In - 10/22/21 1100   ? ?  ? Check-In  ? Supervising physician immediately available to respond to emergencies [facility] MD immediately available   ? Physician(s) Dr Harl Bowie   ? Location [facility]   ? Staff Present Geanie Cooley, RN;Lucianna Ostlund Hassell Done, RN, Bjorn Loser, MS, ACSM-CEP, Exercise Physiologist;Heather Zigmund Daniel, Exercise Physiologist   ? Virtual Visit No   ? Medication changes reported     No   ? Fall or balance concerns reported    Yes   ? Comments Pt has fallen twice in the past year due to feeling dizzy and her legs giving out. She reports feeling dizzy frequently.   ? Tobacco Cessation No Change   ? Warm-up and Cool-down Performed as group-led instruction   ? Resistance Training Performed Yes   ? VAD Patient? No   ? PAD/SET Patient? No   ?  ? Pain Assessment  ? Currently in Pain? No/denies   ? Multiple Pain Sites No   ? ?  ?  ? ?  ? ? ?Capillary Blood Glucose: ?No results found for this or any previous visit (from the past 24 hour(s)). ? ? ? ?Social History  ? ?Tobacco Use  ?Smoking Status Some Days  ? Packs/day: 0.50  ? Types: Cigarettes  ? Last attempt to quit: 02/07/2019  ? Years since quitting: 2.7  ?Smokeless Tobacco Never  ? ? ?Goals Met:  ?Independence with exercise equipment ?Exercise tolerated well ?No report of concerns or symptoms today ?Strength training completed today ? ?Goals Unmet:  ?Not Applicable ? ?Comments: Check out 1200. ? ? ?Dr. Carlyle Dolly is Medical Director for [place] ?

## 2021-10-24 ENCOUNTER — Encounter (HOSPITAL_COMMUNITY)
Admission: RE | Admit: 2021-10-24 | Discharge: 2021-10-24 | Disposition: A | Discharge status: Home / Self Care | Payer: Medicaid Other | Source: Ambulatory Visit | Attending: Cardiovascular Disease | Admitting: Cardiovascular Disease

## 2021-10-24 DIAGNOSIS — I214 Non-ST elevation (NSTEMI) myocardial infarction: Secondary | ICD-10-CM

## 2021-10-24 DIAGNOSIS — Z955 Presence of coronary angioplasty implant and graft: Secondary | ICD-10-CM

## 2021-10-24 NOTE — Progress Notes (Signed)
Daily Session Note ? ?Patient Details  ?Name: Anita Michael ?MRN: [id number] ?Date of Birth: 05-17-1966 ?Referring Provider:   ?Flowsheet Row CARDIAC REHAB PHASE II ORIENTATION from 09/27/2021 in [place]  ?Referring Provider Dr. Audie Box  ? ?  ? ? ?Encounter Date: 10/24/2021 ? ?Check In: ? Session Check In - 10/24/21 1056   ? ?  ? Check-In  ? Supervising physician immediately available to respond to emergencies [facility] MD immediately available   ? Physician(s) Dr Harl Bowie   ? Location [facility]   ? Staff Present Aundra Dubin, RN, Joanette Gula, RN, BSN;Heather Otho Ket, BS, Exercise Physiologist   ? Virtual Visit No   ? Medication changes reported     No   ? Fall or balance concerns reported    Yes   ? Comments Pt has fallen twice in the past year due to feeling dizzy and her legs giving out. She reports feeling dizzy frequently.   ? Tobacco Cessation No Change   ? Warm-up and Cool-down Performed as group-led instruction   ? Resistance Training Performed Yes   ? VAD Patient? No   ? PAD/SET Patient? No   ?  ? Pain Assessment  ? Currently in Pain? No/denies   ? Multiple Pain Sites No   ? ?  ?  ? ?  ? ? ?Capillary Blood Glucose: ?No results found for this or any previous visit (from the past 24 hour(s)). ? ? ? ?Social History  ? ?Tobacco Use  ?Smoking Status Some Days  ? Packs/day: 0.50  ? Types: Cigarettes  ? Last attempt to quit: 02/07/2019  ? Years since quitting: 2.7  ?Smokeless Tobacco Never  ? ? ?Goals Met:  ?Independence with exercise equipment ?Exercise tolerated well ?No report of concerns or symptoms today ?Strength training completed today ? ?Goals Unmet:  ?Not Applicable ? ?Comments: Check out 1200. ? ? ?Dr. Carlyle Dolly is Medical Director for [place] ?

## 2021-10-25 ENCOUNTER — Other Ambulatory Visit (HOSPITAL_COMMUNITY): Payer: Self-pay | Admitting: Neurology

## 2021-10-25 ENCOUNTER — Other Ambulatory Visit: Payer: Self-pay | Admitting: Neurology

## 2021-10-25 DIAGNOSIS — M5000 Cervical disc disorder with myelopathy, unspecified cervical region: Secondary | ICD-10-CM

## 2021-10-26 ENCOUNTER — Encounter (HOSPITAL_COMMUNITY): Payer: Medicaid Other

## 2021-10-29 ENCOUNTER — Encounter: Payer: Self-pay | Admitting: Obstetrics and Gynecology

## 2021-10-29 ENCOUNTER — Ambulatory Visit: Payer: Medicaid Other | Admitting: Obstetrics and Gynecology

## 2021-10-29 ENCOUNTER — Encounter (HOSPITAL_COMMUNITY): Payer: Medicaid Other

## 2021-10-29 VITALS — BP 127/86 | HR 84 | Ht 68.0 in | Wt 192.0 lb

## 2021-10-29 DIAGNOSIS — N393 Stress incontinence (female) (male): Secondary | ICD-10-CM

## 2021-10-29 DIAGNOSIS — M62838 Other muscle spasm: Secondary | ICD-10-CM

## 2021-10-29 DIAGNOSIS — R350 Frequency of micturition: Secondary | ICD-10-CM | POA: Diagnosis not present

## 2021-10-29 LAB — POCT URINALYSIS DIPSTICK
Appearance: NORMAL
Bilirubin, UA: NEGATIVE
Blood, UA: NEGATIVE
Glucose, UA: NEGATIVE
Ketones, UA: NEGATIVE
Nitrite, UA: NEGATIVE
Protein, UA: NEGATIVE
Spec Grav, UA: 1.015 (ref 1.010–1.025)
Urobilinogen, UA: 0.2 E.U./dL
pH, UA: 6 (ref 5.0–8.0)

## 2021-10-29 NOTE — Progress Notes (Deleted)
LATE TO APPOINTMENT - WILL RESCHEDULE ?

## 2021-10-29 NOTE — Progress Notes (Signed)
[facility] ?New Patient Evaluation and Consultation ? ?Referring Provider: Estill Dooms, NP ?PCP: [facility] ?Date of Service: 10/29/2021 ? ?SUBJECTIVE ?Chief Complaint: New Patient (Initial Visit) Anita Michael is a 55 y.o. female consult for urinary leakage with cough and sneezing. Pt complains of vaginal pain.) ? ?History of Present Illness: Anita Michael is a 56 y.o. White or Caucasian female seen in consultation at the request of NP Derrek Monaco for evaluation of incontinence and pelvic pain.   ? ?Review of records from [facility] significant for: ?Has been having pelvic pain. Has tried gabapentin but did not improve symptoms. Also has urinary leakage ? ?Urinary Symptoms: ?Leaks urine with cough/ sneeze. Does not leak with urgency.  ?Leaks 1-2 time(s) per week ?Pad use: none ?She is bothered by her UI symptoms. ? ?Day time voids 3-4.  Nocturia: 1 times per night to void. ?Voiding dysfunction: she empties her bladder well.  ?does not use a catheter to empty bladder.  ?When urinating, she feels a weak stream, difficulty starting urine stream, and dribbling after finishing ? ?UTIs: 1 UTI's in the last year.   ?Denies history of blood in urine and kidney or bladder stones ? ?Pelvic Organ Prolapse Symptoms:                  ?She Denies a feeling of a bulge the vaginal area.  ? ?Bowel Symptom: ?Bowel movements: 2-3 time(s) per week ?Stool consistency: soft  ?Straining: yes.  ?Splinting: no.  ?Incomplete evacuation: no.  ?She Denies accidental bowel leakage / fecal incontinence ?Bowel regimen: fiber  ? ?Sexual Function ?Sexually active: no.  ? ?Pelvic Pain ?Admits to pelvic pain ?Location: vagina- inside ?Pain occurs: very often- can sometimes be every day ?Prior pain treatment: lidocaine, gabapentin- did not help ?Does not currently have the pain.  ? ? ?Past Medical History:  ?Past Medical History:  ?Diagnosis Date  ? CAD (coronary artery disease)   ?  a. s/p NSTEMI in 06/2021 with DES to mid-RCA. Residual disease along D1 and 1st Mrg with medical management recommended.  ? Essential hypertension   ? Fibromyalgia   ? Generalized headaches   ? History of stroke   ? Noted incidentally by brain MRI July 2022  ? Mitral regurgitation   ? a. moderate to severe by echo in 06/2021  ? Polycythemia   ? PVC's (premature ventricular contractions)   ? ? ? ?Past Surgical History:   ?Past Surgical History:  ?Procedure Laterality Date  ? APPENDECTOMY    ? BREAST BIOPSY Right 2015  ? Fibroadenoma  ? CHOLECYSTECTOMY    ? CORONARY STENT INTERVENTION N/A 07/06/2021  ? Procedure: CORONARY STENT INTERVENTION;  Surgeon: Sherren Mocha, MD;  Location: [facility];  Service: Cardiovascular;  Laterality: N/A;  ? LEFT HEART CATH AND CORONARY ANGIOGRAPHY N/A 07/06/2021  ? Procedure: LEFT HEART CATH AND CORONARY ANGIOGRAPHY;  Surgeon: Sherren Mocha, MD;  Location: [facility];  Service: Cardiovascular;  Laterality: N/A;  ? TONSILLECTOMY    ? ? ? ?Past OB/GYN History: ?OB History  ?Gravida Para Term Preterm AB Living  ?'7       1 6  '$ ?SAB IAB Ectopic Multiple Live Births  ?'1     1 6  '$ ?  ?# Outcome Date GA Lbr Len/2nd Weight Sex Delivery Anes PTL Lv  ?7 Gravida           ?6 Gravida           ?  5 Gravida           ?4 Gravida           ?3 Gravida           ?2 Gravida           ?1 SAB           ? ? ?Vaginal deliveries: 6 (three were breech),  Forceps/ Vacuum deliveries: 0, Cesarean section: 0 ?Menopausal: Yes, at age 65, Denies vaginal bleeding since menopause ?Contraception: none. ?Last pap smear was 2022.  Any history of abnormal pap smears: no. ? ? ?Medications: She has a current medication list which includes the following prescription(s): aimovig, amitriptyline, aspirin, atorvastatin, cetirizine, cholecalciferol, cyclobenzaprine, fluticasone, isosorbide mononitrate, losartan, nitroglycerin, nystatin, nystatin ointment, premarin, ticagrelor, and acetaminophen.  ? ?Allergies:  Patient is allergic to nifedipine and latex.  ? ?Social History:  ?Social History  ? ?Tobacco Use  ? Smoking status: Some Days  ?  Packs/day: 0.50  ?  Types: Cigarettes  ?  Last attempt to quit: 02/07/2019  ?  Years since quitting: 2.7  ? Smokeless tobacco: Never  ?Vaping Use  ? Vaping Use: Never used  ?Substance Use Topics  ? Alcohol use: No  ? Drug use: No  ? ? ?Relationship status: widowed ?She lives alone ?She is employed as a Physicist, medical. ?Regular exercise: Yes: cardiac rehab ?History of abuse: No ? ?Family History:   ?Family History  ?Problem Relation Age of Onset  ? Breast cancer Mother 54  ? Breast cancer Sister 17  ? Breast cancer Maternal Grandmother 75  ? Breast cancer Sister 71  ? ? ? ?Review of Systems: Review of Systems  ?Constitutional:  Positive for malaise/fatigue. Negative for fever and weight loss.  ?Respiratory:  Negative for cough, shortness of breath and wheezing.   ?Cardiovascular:  Positive for palpitations. Negative for chest pain and leg swelling.  ?Gastrointestinal:  Negative for abdominal pain and blood in stool.  ?Genitourinary:  Negative for dysuria.  ?Musculoskeletal:  Negative for myalgias.  ?Skin:  Negative for rash.  ?Neurological:  Positive for dizziness and headaches.  ?Endo/Heme/Allergies:  Does not bruise/bleed easily.  ?Psychiatric/Behavioral:  Negative for depression. The patient is not nervous/anxious.   ? ? ?OBJECTIVE ?Physical Exam: ?Vitals:  ? 10/29/21 1322  ?BP: 127/86  ?Pulse: 84  ?Weight: 192 lb (87.1 kg)  ?Height: '5\' 8"'$  (1.727 m)  ? ? ?Physical Exam ?Constitutional:   ?   General: She is not in acute distress. ?Pulmonary:  ?   Effort: Pulmonary effort is normal.  ?Abdominal:  ?   General: There is no distension.  ?   Palpations: Abdomen is soft.  ?   Tenderness: There is no abdominal tenderness. There is no rebound.  ?Musculoskeletal:     ?   General: No swelling. Normal range of motion.  ?Skin: ?   General: Skin is warm and dry.  ?   Findings: No rash.  ?Neurological:   ?   Mental Status: She is alert and oriented to person, place, and time.  ?Psychiatric:     ?   Mood and Affect: Mood normal.     ?   Behavior: Behavior normal.  ? ? ? ?GU / Detailed Urogynecologic Evaluation:  ?Pelvic Exam: Normal external female genitalia; Bartholin's and Skene's glands normal in appearance; urethral meatus normal in appearance, no urethral masses or discharge.  ? ?CST: negative ? ?Speculum exam reveals normal vaginal mucosa with atrophy. Cervix normal appearance. Uterus normal single,  nontender. Adnexa no mass, fullness, tenderness.   ? ? ?Pelvic floor strength II/V ? ?Pelvic floor musculature: Right levator non-tender, Right obturator tender, Left levator tender, Left obturator tender. Reproduces pelvic pain ? ?POP-Q:  ? ?POP-Q ? ?-3  ?                                          Aa   ?-3 ?                                          Ba  ?-8.5  ?                                            C  ? ?3  ?                                          Gh  ?3.5  ?                                          Pb  ?10  ?                                          tvl  ? ?-2  ?                                          Ap  ?-2  ?                                          Bp  ?-9  ?                                            D  ? ? ? ?Rectal Exam:  ?Normal external rectum.  ? ?Post-Void Residual (PVR) by Bladder Scan: ?In order to evaluate bladder emptying, we discussed obtaining a postvoid residual and she agreed to this procedure. ? ?Procedure: The ultrasound unit was placed on the patient's abdomen in the suprapubic region after the patient had voided. A PVR of 23 ml was obtained by bladder scan. ? ?Laboratory Results: ?POC urine: trace leukocytes ? ? ?ASSESSMENT AND PLAN ?Ms. Weinheimer is a 56 y.o. with:  ?1. Levator spasm   ?2. SUI (stress urinary incontinence, female)   ?3. Urinary frequency   ? ?Levator spasm ?- Not currently symptomatic. We discussed this may be a manifestation of her fibromyalgia.  ?- The origin of  pelvic floor muscle spasm can be multifactorial, including primary, reactive to a different pain source,  trauma, or even part of a centralized pain syndrome.Treatment options include pelvic floor physical

## 2021-10-30 ENCOUNTER — Inpatient Hospital Stay (HOSPITAL_COMMUNITY): Payer: Medicaid Other | Admitting: Hematology

## 2021-10-31 ENCOUNTER — Encounter (HOSPITAL_COMMUNITY)
Admission: RE | Admit: 2021-10-31 | Discharge: 2021-10-31 | Disposition: A | Discharge status: Home / Self Care | Payer: Medicaid Other | Source: Ambulatory Visit | Attending: Cardiovascular Disease | Admitting: Cardiovascular Disease

## 2021-10-31 DIAGNOSIS — Z955 Presence of coronary angioplasty implant and graft: Secondary | ICD-10-CM

## 2021-10-31 DIAGNOSIS — I214 Non-ST elevation (NSTEMI) myocardial infarction: Secondary | ICD-10-CM | POA: Diagnosis not present

## 2021-10-31 NOTE — Progress Notes (Signed)
Daily Session Note ? ?Patient Details  ?Name: Anita Michael ?MRN: [id number] ?Date of Birth: 1966/03/20 ?Referring Provider:   ?Flowsheet Row CARDIAC REHAB PHASE II ORIENTATION from 09/27/2021 in [facility]  ?Referring Provider Dr. Audie Box  ? ?  ? ? ?Encounter Date: 10/31/2021 ? ?Check In: ? Session Check In - 10/31/21 1056   ? ?  ? Check-In  ? Supervising physician immediately available to respond to emergencies [facility] MD immediately available   ? Physician(s) Dr Gasper Sells   ? Location [facility]   ? Staff Present Geanie Cooley, RN;Heather Otho Ket, [place], Exercise Physiologist;Brendy Ficek Hassell Done, RN, Bjorn Loser, MS, ACSM-CEP, Exercise Physiologist   ? Virtual Visit No   ? Medication changes reported     No   ? Fall or balance concerns reported    Yes   ? Comments Pt has fallen twice in the past year due to feeling dizzy and her legs giving out. She reports feeling dizzy frequently.   ? Tobacco Cessation No Change   ? Warm-up and Cool-down Performed as group-led instruction   ? Resistance Training Performed Yes   ? VAD Patient? No   ? PAD/SET Patient? No   ?  ? Pain Assessment  ? Currently in Pain? No/denies   ? Multiple Pain Sites No   ? ?  ?  ? ?  ? ? ?Capillary Blood Glucose: ?No results found for this or any previous visit (from the past 24 hour(s)). ? ? ? ?Social History  ? ?Tobacco Use  ?Smoking Status Some Days  ? Packs/day: 0.50  ? Types: Cigarettes  ? Last attempt to quit: 02/07/2019  ? Years since quitting: 2.7  ?Smokeless Tobacco Never  ? ? ?Goals Met:  ?Independence with exercise equipment ?Exercise tolerated well ?No report of concerns or symptoms today ?Strength training completed today ? ?Goals Unmet:  ?Not Applicable ? ?Comments: Check out 1200. ? ? ?Dr. Carlyle Dolly is Medical Director for [place] ?

## 2021-11-02 ENCOUNTER — Encounter (HOSPITAL_COMMUNITY)
Admission: RE | Admit: 2021-11-02 | Discharge: 2021-11-02 | Disposition: A | Discharge status: Home / Self Care | Payer: Medicaid Other | Source: Ambulatory Visit | Attending: Cardiovascular Disease | Admitting: Cardiovascular Disease

## 2021-11-02 DIAGNOSIS — I214 Non-ST elevation (NSTEMI) myocardial infarction: Secondary | ICD-10-CM | POA: Diagnosis not present

## 2021-11-02 DIAGNOSIS — Z955 Presence of coronary angioplasty implant and graft: Secondary | ICD-10-CM

## 2021-11-02 NOTE — Progress Notes (Signed)
Daily Session Note ? ?Patient Details  ?Name: Anita Michael ?MRN: [id number] ?Date of Birth: 1966/05/30 ?Referring Provider:   ?Flowsheet Row CARDIAC REHAB PHASE II ORIENTATION from 09/27/2021 in [facility]  ?Referring Provider Dr. Audie Box  ? ?  ? ? ?Encounter Date: 11/02/2021 ? ?Check In: ? Session Check In - 11/02/21 1100   ? ?  ? Check-In  ? Supervising physician immediately available to respond to emergencies [facility] MD immediately available   ? Physician(s) Dr. Johnsie Cancel   ? Location [facility]   ? Staff Present Redge Gainer, BS, Exercise Physiologist;Weylyn Ricciuti Wynetta Emery, RN, Bjorn Loser, MS, ACSM-CEP, Exercise Physiologist   ? Virtual Visit No   ? Medication changes reported     No   ? Fall or balance concerns reported    Yes   ? Comments Pt has fallen twice in the past year due to feeling dizzy and her legs giving out. She reports feeling dizzy frequently.   ? Tobacco Cessation No Change   ? Warm-up and Cool-down Performed as group-led instruction   ? Resistance Training Performed Yes   ? VAD Patient? No   ? PAD/SET Patient? No   ?  ? Pain Assessment  ? Currently in Pain? No/denies   ? Multiple Pain Sites No   ? ?  ?  ? ?  ? ? ?Capillary Blood Glucose: ?No results found for this or any previous visit (from the past 24 hour(s)). ? ? ? ?Social History  ? ?Tobacco Use  ?Smoking Status Some Days  ? Packs/day: 0.50  ? Types: Cigarettes  ? Last attempt to quit: 02/07/2019  ? Years since quitting: 2.7  ?Smokeless Tobacco Never  ? ? ?Goals Met:  ?Independence with exercise equipment ?Exercise tolerated well ?No report of concerns or symptoms today ?Strength training completed today ? ?Goals Unmet:  ?Not Applicable ? ?Comments: Check out 1200. ? ? ?Dr. Carlyle Dolly is Medical Director for [facility] ?

## 2021-11-05 ENCOUNTER — Encounter (HOSPITAL_COMMUNITY)
Admission: RE | Admit: 2021-11-05 | Discharge: 2021-11-05 | Disposition: A | Discharge status: Home / Self Care | Payer: Medicaid Other | Source: Ambulatory Visit | Attending: Cardiovascular Disease | Admitting: Cardiovascular Disease

## 2021-11-05 VITALS — Wt 188.1 lb

## 2021-11-05 DIAGNOSIS — I214 Non-ST elevation (NSTEMI) myocardial infarction: Secondary | ICD-10-CM

## 2021-11-05 DIAGNOSIS — Z955 Presence of coronary angioplasty implant and graft: Secondary | ICD-10-CM

## 2021-11-05 NOTE — Progress Notes (Signed)
Daily Session Note ? ?Patient Details  ?Name: Anita Michael ?MRN: [id number] ?Date of Birth: 11/09/1965 ?Referring Provider:   ?Flowsheet Row CARDIAC REHAB PHASE II ORIENTATION from 09/27/2021 in [facility]  ?Referring Provider Dr. Audie Box  ? ?  ? ? ?Encounter Date: 11/05/2021 ? ?Check In: ? Session Check In - 11/05/21 1057   ? ?  ? Check-In  ? Supervising physician immediately available to respond to emergencies [facility] MD immediately available   ? Physician(s) Dr. Johnsie Cancel   ? Location [facility]   ? Staff Present Redge Gainer, BS, Exercise Physiologist;Apryll Hinkle Hassell Done, RN, Bjorn Loser, MS, ACSM-CEP, Exercise Physiologist   ? Virtual Visit No   ? Medication changes reported     No   ? Fall or balance concerns reported    Yes   ? Comments Pt has fallen twice in the past year due to feeling dizzy and her legs giving out. She reports feeling dizzy frequently.   ? Tobacco Cessation No Change   ? Warm-up and Cool-down Performed as group-led instruction   ? Resistance Training Performed Yes   ? VAD Patient? No   ? PAD/SET Patient? No   ?  ? Pain Assessment  ? Currently in Pain? No/denies   ? Multiple Pain Sites No   ? ?  ?  ? ?  ? ? ?Capillary Blood Glucose: ?No results found for this or any previous visit (from the past 24 hour(s)). ? ? ? ?Social History  ? ?Tobacco Use  ?Smoking Status Some Days  ? Packs/day: 0.50  ? Types: Cigarettes  ? Last attempt to quit: 02/07/2019  ? Years since quitting: 2.7  ?Smokeless Tobacco Never  ? ? ?Goals Met:  ?Independence with exercise equipment ?Exercise tolerated well ?No report of concerns or symptoms today ?Strength training completed today ? ?Goals Unmet:  ?Not Applicable ? ?Comments: Checkout at 1200. ? ? ?Dr. Carlyle Dolly is Medical Director for [facility] ?

## 2021-11-07 ENCOUNTER — Encounter (HOSPITAL_COMMUNITY): Payer: Medicaid Other | Admitting: Hematology

## 2021-11-07 ENCOUNTER — Encounter (HOSPITAL_COMMUNITY): Payer: Medicaid Other

## 2021-11-09 ENCOUNTER — Encounter (HOSPITAL_COMMUNITY): Payer: Medicaid Other

## 2021-11-12 ENCOUNTER — Encounter (HOSPITAL_COMMUNITY): Payer: Medicaid Other

## 2021-11-13 ENCOUNTER — Ambulatory Visit (HOSPITAL_COMMUNITY)
Admission: RE | Admit: 2021-11-13 | Discharge: 2021-11-13 | Disposition: A | Discharge status: Home / Self Care | Payer: Medicaid Other | Source: Ambulatory Visit | Attending: Neurology | Admitting: Neurology

## 2021-11-13 DIAGNOSIS — M5000 Cervical disc disorder with myelopathy, unspecified cervical region: Secondary | ICD-10-CM | POA: Diagnosis not present

## 2021-11-14 ENCOUNTER — Encounter (HOSPITAL_COMMUNITY): Admission: RE | Admit: 2021-11-14 | Payer: Medicaid Other | Source: Ambulatory Visit

## 2021-11-14 NOTE — Progress Notes (Signed)
Cardiac Individual Treatment Plan ? ?Patient Details  ?Name: Anita Michael ?MRN: [id number] ?Date of Birth: 05/15/1966 ?Referring Provider:   ?Flowsheet Row CARDIAC REHAB PHASE II ORIENTATION from 09/27/2021 in [place] [facility]  ?Referring Provider Dr. Audie Box  ? ?  ? ? ?Initial Encounter Date:  ?Flowsheet Row CARDIAC REHAB PHASE II ORIENTATION from 09/27/2021 in [facility]  ?Date 10/04/21  ? ?  ? ? ?Visit Diagnosis: NSTEMI (non-ST elevated myocardial infarction) ([place]) ? ?Status post coronary artery stent placement ? ?Patient's Home Medications on Admission: ? ?Current Outpatient Medications:  ?  acetaminophen (TYLENOL) 500 MG tablet, Take 1,000 mg by mouth every 6 (six) hours as needed (for pain.)., Disp: , Rfl:  ?  AIMOVIG 140 MG/ML SOAJ, Inject 140 mg into the skin every 30 (thirty) days., Disp: , Rfl:  ?  amitriptyline (ELAVIL) 50 MG tablet, Take 50 mg by mouth at bedtime., Disp: , Rfl:  ?  aspirin EC 81 MG EC tablet, Take 1 tablet (81 mg total) by mouth daily. Swallow whole., Disp: 30 tablet, Rfl: 11 ?  atorvastatin (LIPITOR) 80 MG tablet, Take 1 tablet (80 mg total) by mouth daily at 6 PM., Disp: 90 tablet, Rfl: 1 ?  cetirizine (ZYRTEC) 10 MG tablet, Take 10 mg by mouth daily as needed for allergies., Disp: , Rfl:  ?  cholecalciferol (VITAMIN D3) 25 MCG (1000 UT) tablet, Take 1,000 Units by mouth daily in the afternoon., Disp: , Rfl:  ?  cyclobenzaprine (FLEXERIL) 10 MG tablet, Take 10 mg by mouth 2 (two) times daily as needed for muscle spasms., Disp: , Rfl:  ?  fluticasone (FLONASE) 50 MCG/ACT nasal spray, Place 2 sprays into both nostrils daily. (Patient taking differently: Place 2 sprays into both nostrils daily as needed for allergies.), Disp: 16 g, Rfl: 0 ?  isosorbide mononitrate (IMDUR) 30 MG 24 hr tablet, Take 1 tablet (30 mg total) by mouth daily. (Patient taking differently: Take 30 mg by mouth daily in the afternoon.), Disp: 90 tablet, Rfl: 3 ?  losartan (COZAAR)  100 MG tablet, TAKE ONE TABLET BY MOUTH ONCE DAILY., Disp: 90 tablet, Rfl: 2 ?  nitroGLYCERIN (NITROSTAT) 0.4 MG SL tablet, Place 1 tablet (0.4 mg total) under the tongue every 5 (five) minutes x 3 doses as needed for chest pain., Disp: 25 tablet, Rfl: 2 ?  nystatin (MYCOSTATIN/NYSTOP) powder, 1 application. 2 (two) times daily as needed (skin irritation)., Disp: , Rfl:  ?  nystatin ointment (MYCOSTATIN), Apply 1 application. topically once a week., Disp: , Rfl:  ?  PREMARIN vaginal cream, Place 1 application. vaginally once a week., Disp: , Rfl:  ?  ticagrelor (BRILINTA) 90 MG TABS tablet, Take 1 tablet (90 mg total) by mouth 2 (two) times daily., Disp: 180 tablet, Rfl: 3 ? ?Past Medical History: ?Past Medical History:  ?Diagnosis Date  ? CAD (coronary artery disease)   ? a. s/p NSTEMI in 06/2021 with DES to mid-RCA. Residual disease along D1 and 1st Mrg with medical management recommended.  ? Essential hypertension   ? Fibromyalgia   ? Generalized headaches   ? History of stroke   ? Noted incidentally by brain MRI July 2022  ? Mitral regurgitation   ? a. moderate to severe by echo in 06/2021  ? Polycythemia   ? PVC's (premature ventricular contractions)   ? ? ?Tobacco Use: ?Social History  ? ?Tobacco Use  ?Smoking Status Some Days  ? Packs/day: 0.50  ? Types: Cigarettes  ? Last  attempt to quit: 02/07/2019  ? Years since quitting: 2.7  ?Smokeless Tobacco Never  ? ? ?Labs: ?Review Flowsheet   ? ?  ?  Latest Ref Rng & Units 07/06/2021 07/07/2021  ?Labs for [facility] Cardiac and Pulmonary Rehab  ?Cholestrol 0 - 200 mg/dL  195    ? 180    ?LDL (calc) 0 - 99 mg/dL  116    ? 100    ?HDL-C >40 mg/dL  36    ? 34    ?Trlycerides <150 mg/dL  214    ? 232    ?Hemoglobin A1c 4.8 - 5.6 %  6.2    ?PH, Arterial 7.350 - 7.450 7.275     ?PCO2 arterial 32.0 - 48.0 mmHg 55.9     ?Bicarbonate 20.0 - 28.0 mmol/L 25.9     ?TCO2 22 - 32 mmol/L 28     ?Acid-base deficit 0.0 - 2.0 mmol/L 2.0     ?O2 Saturation % 78.0     ?  ? ? Multiple values  from one day are sorted in reverse-chronological order  ?  ?  ? ? ?Capillary Blood Glucose: ?No results found for: GLUCAP ? ? ?Exercise Target Goals: ?Exercise Program Goal: ?Individual exercise prescription set using results from initial 6 min walk test and THRR while considering  patient?s activity barriers and safety.  ? ?Exercise Prescription Goal: ?Starting with aerobic activity 30 plus minutes a day, 3 days per week for initial exercise prescription. Provide home exercise prescription and guidelines that participant acknowledges understanding prior to discharge. ? ?Activity Barriers & Risk Stratification: ? Activity Barriers & Cardiac Risk Stratification - 09/27/21 1326   ? ?  ? Activity Barriers & Cardiac Risk Stratification  ? Activity Barriers Fibromyalgia;Deconditioning;Muscular Weakness;Shortness of Breath;History of Falls   ? Cardiac Risk Stratification High   ? ?  ?  ? ?  ? ? ?6 Minute Walk: ? 6 Minute Walk   ? ? [place] Name 09/27/21 1418  ?  ?  ?  ? 6 Minute Walk  ? Phase Initial    ? Distance 700 feet    ? Walk Time 6 minutes    ? # of Rest Breaks 1    ? MPH 1.32    ? METS 2.26    ? RPE 13    ? VO2 Peak 7.9    ? Symptoms Yes (comment)    ? Comments one standing rest break for 30 seconds due to being tired    ? Resting HR 80 bpm    ? Resting BP 132/84    ? Resting Oxygen Saturation  95 %    ? Exercise Oxygen Saturation  during 6 min walk 98 %    ? Max Ex. HR 87 bpm    ? Max Ex. BP 160/80    ? 2 Minute Post BP 138/80    ? ?  ?  ? ?  ? ? ?Oxygen Initial Assessment: ? ? ?Oxygen Re-Evaluation: ? ? ?Oxygen Discharge (Final Oxygen Re-Evaluation): ? ? ?Initial Exercise Prescription: ? Initial Exercise Prescription - 09/27/21 1400   ? ?  ? Date of Initial Exercise RX and Referring Provider  ? Date 10/04/21   ? Referring Provider Dr. Audie Box   ? Expected Discharge Date 12/21/21   ?  ? NuStep  ? Level 1   ? SPM 60   ? Minutes 22   ?  ? Arm Ergometer  ? Level 1   ? RPM 60   ?  Minutes 17   ?  ? Prescription Details  ?  Frequency (times per week) 3   ? Duration Progress to 30 minutes of continuous aerobic without signs/symptoms of physical distress   ?  ? Intensity  ? THRR 40-80% of Max Heartrate 65-131   ? Ratings of Perceived Exertion 11-13   ? Perceived Dyspnea 0-4   ?  ? Resistance Training  ? Training Prescription Yes   ? Weight 2   ? Reps 10-15   ? ?  ?  ? ?  ? ? ?Perform Capillary Blood Glucose checks as needed. ? ?Exercise Prescription Changes: ? ? Exercise Prescription Changes   ? ? [place] Name 10/01/21 1156 10/15/21 1200 10/22/21 1100 10/24/21 1507 11/05/21 1150  ?  ? Response to Exercise  ? Blood Pressure (Admit) 122/90 122/84 -- 112/72 132/70  ? Blood Pressure (Exercise) 140/96 106/70 -- 118/70 106/80  ? Blood Pressure (Exit) 126/88 118/76 -- 124/68 106/60  ? Heart Rate (Admit) 89 bpm 110 bpm -- 85 bpm 86 bpm  ? Heart Rate (Exercise) 101 bpm 119 bpm -- 91 bpm 92 bpm  ? Heart Rate (Exit) 94 bpm 100 bpm -- 81 bpm 88 bpm  ? Rating of Perceived Exertion (Exercise) 13 12 -- 12 12  ? Duration Continue with 30 min of aerobic exercise without signs/symptoms of physical distress. Continue with 30 min of aerobic exercise without signs/symptoms of physical distress. -- Continue with 30 min of aerobic exercise without signs/symptoms of physical distress. Continue with 30 min of aerobic exercise without signs/symptoms of physical distress.  ? Intensity THRR unchanged THRR unchanged -- THRR unchanged THRR unchanged  ?  ? Progression  ? Progression Continue to progress workloads to maintain intensity without signs/symptoms of physical distress. Continue to progress workloads to maintain intensity without signs/symptoms of physical distress. -- Continue to progress workloads to maintain intensity without signs/symptoms of physical distress. Continue to progress workloads to maintain intensity without signs/symptoms of physical distress.  ?  ? Resistance Training  ? Training Prescription Yes Yes -- Yes Yes  ? Weight 2 2 -- 2 2  ? Reps  10-15 10-15 -- 10-15 10-15  ? Time 10 Minutes 10 Minutes -- 10 Minutes 10 Minutes  ?  ? NuStep  ? Level 1 1 -- 1 2  ? SPM 54 62 -- 70 60  ? Minutes 22 22 -- 22 22  ? METs 1.73 1.76 -- 1.83 1.72  ?  ? Arm

## 2021-11-16 ENCOUNTER — Encounter (HOSPITAL_COMMUNITY): Payer: Medicaid Other

## 2021-11-19 ENCOUNTER — Encounter (HOSPITAL_COMMUNITY)
Admission: RE | Admit: 2021-11-19 | Discharge: 2021-11-19 | Disposition: A | Discharge status: Home / Self Care | Payer: Medicaid Other | Source: Ambulatory Visit | Attending: Cardiovascular Disease | Admitting: Cardiovascular Disease

## 2021-11-19 DIAGNOSIS — Z955 Presence of coronary angioplasty implant and graft: Secondary | ICD-10-CM | POA: Insufficient documentation

## 2021-11-19 DIAGNOSIS — I214 Non-ST elevation (NSTEMI) myocardial infarction: Secondary | ICD-10-CM | POA: Insufficient documentation

## 2021-11-19 DIAGNOSIS — E782 Mixed hyperlipidemia: Secondary | ICD-10-CM | POA: Insufficient documentation

## 2021-11-19 NOTE — Progress Notes (Signed)
Daily Session Note ? ?Patient Details  ?Name: Anita Michael ?MRN: [id number] ?Date of Birth: 1966/05/31 ?Referring Provider:   ?Flowsheet Row CARDIAC REHAB PHASE II ORIENTATION from 09/27/2021 in [place]  ?Referring Provider Dr. Audie Box  ? ?  ? ? ?Encounter Date: 11/19/2021 ? ?Check In: ? Session Check In - 11/19/21 1100   ? ?  ? Check-In  ? Supervising physician immediately available to respond to emergencies [facility] MD immediately available   ? Physician(s) Dr Harl Bowie   ? Location [facility]   ? Staff Present Madelyn Flavors, RN, Jennye Moccasin, RN, BSN;Heather Otho Ket, BS, Exercise Physiologist   ? Virtual Visit No   ? Medication changes reported     No   ? Fall or balance concerns reported    Yes   ? Comments Pt has fallen twice in the past year due to feeling dizzy and her legs giving out. She reports feeling dizzy frequently.   ? Tobacco Cessation No Change   ? Warm-up and Cool-down Performed as group-led instruction   ? Resistance Training Performed Yes   ? VAD Patient? No   ? PAD/SET Patient? No   ?  ? Pain Assessment  ? Currently in Pain? No/denies   ? Multiple Pain Sites No   ? ?  ?  ? ?  ? ? ?Capillary Blood Glucose: ?No results found for this or any previous visit (from the past 24 hour(s)). ? ? ? ?Social History  ? ?Tobacco Use  ?Smoking Status Some Days  ? Packs/day: 0.50  ? Types: Cigarettes  ? Last attempt to quit: 02/07/2019  ? Years since quitting: 2.7  ?Smokeless Tobacco Never  ? ? ?Goals Met:  ?Independence with exercise equipment ?Exercise tolerated well ?No report of concerns or symptoms today ?Strength training completed today ? ?Goals Unmet:  ?Not Applicable ? ?Comments: check out at 1200. ? ? ?Dr. Carlyle Dolly is Medical Director for [place] ?

## 2021-11-21 ENCOUNTER — Encounter (HOSPITAL_COMMUNITY)
Admission: RE | Admit: 2021-11-21 | Discharge: 2021-11-21 | Disposition: A | Discharge status: Home / Self Care | Payer: Medicaid Other | Source: Ambulatory Visit | Attending: Cardiovascular Disease | Admitting: Cardiovascular Disease

## 2021-11-21 DIAGNOSIS — Z955 Presence of coronary angioplasty implant and graft: Secondary | ICD-10-CM | POA: Diagnosis present

## 2021-11-21 DIAGNOSIS — I214 Non-ST elevation (NSTEMI) myocardial infarction: Secondary | ICD-10-CM

## 2021-11-21 DIAGNOSIS — E782 Mixed hyperlipidemia: Secondary | ICD-10-CM | POA: Diagnosis present

## 2021-11-21 NOTE — Progress Notes (Signed)
Daily Session Note ? ?Patient Details  ?Name: Anita Michael ?MRN: [id number] ?Date of Birth: Jun 12, 1966 ?Referring Provider:   ?Flowsheet Row CARDIAC REHAB PHASE II ORIENTATION from 09/27/2021 in [place]  ?Referring Provider Dr. Audie Box  ? ?  ? ? ?Encounter Date: 11/21/2021 ? ?Check In: ? Session Check In - 11/21/21 1100   ? ?  ? Check-In  ? Supervising physician immediately available to respond to emergencies [facility] MD immediately available   ? Physician(s) Dr. Gardiner Rhyme   ? Location [facility]   ? Staff Present Redge Gainer, BS, Exercise Physiologist;Debra Wynetta Emery, RN, Bjorn Loser, MS, ACSM-CEP, Exercise Physiologist   ? Virtual Visit No   ? Medication changes reported     No   ? Fall or balance concerns reported    Yes   ? Comments Pt has fallen twice in the past year due to feeling dizzy and her legs giving out. She reports feeling dizzy frequently.   ? Tobacco Cessation No Change   ? Warm-up and Cool-down Performed as group-led instruction   ? Resistance Training Performed Yes   ? VAD Patient? No   ? PAD/SET Patient? No   ?  ? Pain Assessment  ? Currently in Pain? No/denies   ? Multiple Pain Sites No   ? ?  ?  ? ?  ? ? ?Capillary Blood Glucose: ?No results found for this or any previous visit (from the past 24 hour(s)). ? ? ? ?Social History  ? ?Tobacco Use  ?Smoking Status Some Days  ? Packs/day: 0.50  ? Types: Cigarettes  ? Last attempt to quit: 02/07/2019  ? Years since quitting: 2.7  ?Smokeless Tobacco Never  ? ? ?Goals Met:  ?Independence with exercise equipment ?Exercise tolerated well ?No report of concerns or symptoms today ?Strength training completed today ? ?Goals Unmet:  ?Not Applicable ? ?Comments: check out 1200 ? ? ?Dr. Carlyle Dolly is Medical Director for [facility] ?

## 2021-11-23 ENCOUNTER — Encounter (HOSPITAL_COMMUNITY)
Admission: RE | Admit: 2021-11-23 | Discharge: 2021-11-23 | Disposition: A | Discharge status: Home / Self Care | Payer: Medicaid Other | Source: Ambulatory Visit | Attending: Cardiovascular Disease | Admitting: Cardiovascular Disease

## 2021-11-23 DIAGNOSIS — I214 Non-ST elevation (NSTEMI) myocardial infarction: Secondary | ICD-10-CM

## 2021-11-23 DIAGNOSIS — Z955 Presence of coronary angioplasty implant and graft: Secondary | ICD-10-CM

## 2021-11-23 NOTE — Progress Notes (Signed)
Daily Session Note ? ?Patient Details  ?Name: Anita Michael ?MRN: [id number] ?Date of Birth: [birth year] ?Referring Provider:   ?Flowsheet Row CARDIAC REHAB PHASE II ORIENTATION from 09/27/2021 in [place]  ?Referring Provider Dr. Audie Box  ? ?  ? ? ?Encounter Date: 11/23/2021 ? ?Check In: ? Session Check In - 11/23/21 1054   ? ?  ? Check-In  ? Supervising physician immediately available to respond to emergencies [facility] MD immediately available   ? Physician(s) Dr Radford Pax   ? Location [facility]   ? Staff Present Redge Gainer, BS, Exercise Physiologist;Dalton Kris Mouton, MS, ACSM-CEP, Exercise Physiologist;Addalyn Speedy Hassell Done, RN, BSN   ? Virtual Visit No   ? Medication changes reported     No   ? Fall or balance concerns reported    Yes   ? Comments Pt has fallen twice in the past year due to feeling dizzy and her legs giving out. She reports feeling dizzy frequently.   ? Tobacco Cessation No Change   ? Warm-up and Cool-down Performed as group-led instruction   ? Resistance Training Performed Yes   ? VAD Patient? No   ? PAD/SET Patient? No   ?  ? Pain Assessment  ? Currently in Pain? No/denies   ? Multiple Pain Sites No   ? ?  ?  ? ?  ? ? ?Capillary Blood Glucose: ?No results found for this or any previous visit (from the past 24 hour(s)). ? ? ? ?Social History  ? ?Tobacco Use  ?Smoking Status Some Days  ? Packs/day: 0.50  ? Types: Cigarettes  ? Last attempt to quit: 02/07/2019  ? Years since quitting: 2.7  ?Smokeless Tobacco Never  ? ? ?Goals Met:  ?Independence with exercise equipment ?Exercise tolerated well ?No report of concerns or symptoms today ?Strength training completed today ? ?Goals Unmet:  ?Not Applicable ? ?Comments: check out at 1200. ? ? ?Dr. Carlyle Dolly is Medical Director for [place] ?

## 2021-11-26 ENCOUNTER — Other Ambulatory Visit (HOSPITAL_COMMUNITY)
Admission: RE | Admit: 2021-11-26 | Discharge: 2021-11-26 | Disposition: A | Discharge status: Home / Self Care | Payer: Medicaid Other | Source: Ambulatory Visit | Attending: Physician Assistant | Admitting: Physician Assistant

## 2021-11-26 ENCOUNTER — Encounter (HOSPITAL_COMMUNITY)
Admission: RE | Admit: 2021-11-26 | Discharge: 2021-11-26 | Disposition: A | Discharge status: Home / Self Care | Payer: Medicaid Other | Source: Ambulatory Visit | Attending: Cardiovascular Disease | Admitting: Cardiovascular Disease

## 2021-11-26 VITALS — Wt 189.6 lb

## 2021-11-26 DIAGNOSIS — I214 Non-ST elevation (NSTEMI) myocardial infarction: Secondary | ICD-10-CM | POA: Diagnosis not present

## 2021-11-26 DIAGNOSIS — Z955 Presence of coronary angioplasty implant and graft: Secondary | ICD-10-CM

## 2021-11-26 DIAGNOSIS — E782 Mixed hyperlipidemia: Secondary | ICD-10-CM | POA: Insufficient documentation

## 2021-11-26 LAB — LIPID PANEL
Cholesterol: 131 mg/dL (ref 0–200)
HDL: 36 mg/dL — ABNORMAL LOW (ref >40)
LDL Cholesterol: 65 mg/dL (ref 0–99)
Total CHOL/HDL Ratio: 3.6 RATIO
Triglycerides: 150 mg/dL — ABNORMAL HIGH (ref <150)
VLDL: 30 mg/dL (ref 0–40)

## 2021-11-26 NOTE — Progress Notes (Signed)
Daily Session Note ? ?Patient Details  ?Name: Anita Michael ?MRN: [id number] ?Date of Birth: 06/16/1966 ?Referring Provider:   ?Flowsheet Row CARDIAC REHAB PHASE II ORIENTATION from 09/27/2021 in [place] [facility]  ?Referring Provider Dr. O'Neal  ? ?  ? ? ?Encounter Date: 11/26/2021 ? ?Check In: ? Session Check In - 11/26/21 1058   ? ?  ? Check-In  ? Supervising physician immediately available to respond to emergencies [facility] MD immediately available   ? Physician(s) [place]   ? Location [facility]   ? Staff Present Heather Jachimiak, BS, Exercise Physiologist;Debra Johnson, RN, BSN;Dalton Fletcher, MS, ACSM-CEP, Exercise Physiologist   ? Virtual Visit No   ? Medication changes reported     No   ? Fall or balance concerns reported    Yes   ? Comments Pt has fallen twice in the past year due to feeling dizzy and her legs giving out. She reports feeling dizzy frequently.   ? Tobacco Cessation No Change   ? Warm-up and Cool-down Performed as group-led instruction   ? Resistance Training Performed Yes   ? VAD Patient? No   ? PAD/SET Patient? No   ?  ? Pain Assessment  ? Currently in Pain? No/denies   ? Multiple Pain Sites No   ? ?  ?  ? ?  ? ? ?Capillary Blood Glucose: ?No results found for this or any previous visit (from the past 24 hour(s)). ? ? ? ?Social History  ? ?Tobacco Use  ?Smoking Status Some Days  ? Packs/day: 0.50  ? Types: Cigarettes  ? Last attempt to quit: 02/07/2019  ? Years since quitting: 2.8  ?Smokeless Tobacco Never  ? ? ?Goals Met:  ?Independence with exercise equipment ?Exercise tolerated well ?No report of concerns or symptoms today ?Strength training completed today ? ?Goals Unmet:  ?Not Applicable ? ?Comments: Check out 1200. ? ? ?Dr. Jonathan Branch is Medical Director for [facility] ?

## 2021-11-28 ENCOUNTER — Encounter (HOSPITAL_COMMUNITY): Payer: Medicaid Other

## 2021-11-30 ENCOUNTER — Encounter (HOSPITAL_COMMUNITY): Payer: Medicaid Other

## 2021-12-03 ENCOUNTER — Encounter (HOSPITAL_COMMUNITY)
Admission: RE | Admit: 2021-12-03 | Discharge: 2021-12-03 | Disposition: A | Discharge status: Home / Self Care | Payer: Medicaid Other | Source: Ambulatory Visit | Attending: Cardiovascular Disease | Admitting: Cardiovascular Disease

## 2021-12-03 DIAGNOSIS — Z955 Presence of coronary angioplasty implant and graft: Secondary | ICD-10-CM

## 2021-12-03 DIAGNOSIS — I214 Non-ST elevation (NSTEMI) myocardial infarction: Secondary | ICD-10-CM

## 2021-12-03 NOTE — Progress Notes (Signed)
Daily Session Note ? ?Patient Details  ?Name: Anita Michael ?MRN: [id number] ?Date of Birth: [birth year] ?Referring Provider:   ?Flowsheet Row CARDIAC REHAB PHASE II ORIENTATION from 09/27/2021 in [place]  ?Referring Provider Dr. Audie Box  ? ?  ? ? ?Encounter Date: 12/03/2021 ? ?Check In: ? Session Check In - 12/03/21 1052   ? ?  ? Check-In  ? Supervising physician immediately available to respond to emergencies [facility] MD immediately available   ? Physician(s) Dr Harl Bowie   ? Location [facility]   ? Staff Present Redge Gainer, BS, Exercise Physiologist;Debra Wynetta Emery, RN, Bjorn Loser, MS, ACSM-CEP, Exercise Physiologist;Jerris Fleer Hassell Done, RN, BSN   ? Virtual Visit No   ? Medication changes reported     No   ? Fall or balance concerns reported    Yes   ? Comments Pt has fallen twice in the past year due to feeling dizzy and her legs giving out. She reports feeling dizzy frequently.   ? Tobacco Cessation No Change   ? Warm-up and Cool-down Performed as group-led instruction   ? Resistance Training Performed Yes   ? VAD Patient? No   ? PAD/SET Patient? No   ?  ? Pain Assessment  ? Currently in Pain? No/denies   ? Multiple Pain Sites No   ? ?  ?  ? ?  ? ? ?Capillary Blood Glucose: ?No results found for this or any previous visit (from the past 24 hour(s)). ? ? ? ?Social History  ? ?Tobacco Use  ?Smoking Status Some Days  ? Packs/day: 0.50  ? Types: Cigarettes  ? Last attempt to quit: 02/07/2019  ? Years since quitting: 2.8  ?Smokeless Tobacco Never  ? ? ?Goals Met:  ?Independence with exercise equipment ?Exercise tolerated well ?No report of concerns or symptoms today ?Strength training completed today ? ?Goals Unmet:  ?Not Applicable ? ?Comments: Checkout at 1200. ? ? ?Dr. Carlyle Dolly is Medical Director for [facility] ?

## 2021-12-05 ENCOUNTER — Encounter (HOSPITAL_COMMUNITY)
Admission: RE | Admit: 2021-12-05 | Discharge: 2021-12-05 | Disposition: A | Discharge status: Home / Self Care | Payer: Medicaid Other | Source: Ambulatory Visit | Attending: Cardiovascular Disease | Admitting: Cardiovascular Disease

## 2021-12-05 DIAGNOSIS — I214 Non-ST elevation (NSTEMI) myocardial infarction: Secondary | ICD-10-CM | POA: Diagnosis not present

## 2021-12-05 DIAGNOSIS — Z955 Presence of coronary angioplasty implant and graft: Secondary | ICD-10-CM

## 2021-12-05 NOTE — Progress Notes (Signed)
Daily Session Note ? ?Patient Details  ?Name: Anita Michael ?MRN: [id number] ?Date of Birth: 10/04/1965 ?Referring Provider:   ?Flowsheet Row CARDIAC REHAB PHASE II ORIENTATION from 09/27/2021 in [place]  ?Referring Provider Dr. Audie Box  ? ?  ? ? ?Encounter Date: 12/05/2021 ? ?Check In: ? Session Check In - 12/05/21 1100   ? ?  ? Check-In  ? Supervising physician immediately available to respond to emergencies [facility] MD immediately available   ? Physician(s) Dr Harl Bowie   ? Location [facility]   ? Staff Present Redge Gainer, BS, Exercise Physiologist;Debra Wynetta Emery, RN, Bjorn Loser, MS, ACSM-CEP, Exercise Physiologist;Nate Common Windy Kalata, RN   ? Virtual Visit No   ? Medication changes reported     No   ? Fall or balance concerns reported    Yes   ? Comments Pt has fallen twice in the past year due to feeling dizzy and her legs giving out. She reports feeling dizzy frequently.   ? Tobacco Cessation No Change   ? Warm-up and Cool-down Performed as group-led instruction   ? Resistance Training Performed Yes   ? VAD Patient? No   ? PAD/SET Patient? No   ?  ? Pain Assessment  ? Currently in Pain? No/denies   ? Multiple Pain Sites No   ? ?  ?  ? ?  ? ? ?Capillary Blood Glucose: ?No results found for this or any previous visit (from the past 24 hour(s)). ? ? ? ?Social History  ? ?Tobacco Use  ?Smoking Status Some Days  ? Packs/day: 0.50  ? Types: Cigarettes  ? Last attempt to quit: 02/07/2019  ? Years since quitting: 2.8  ?Smokeless Tobacco Never  ? ? ?Goals Met:  ?Independence with exercise equipment ?Exercise tolerated well ?No report of concerns or symptoms today ?Strength training completed today ? ?Goals Unmet:  ?Not Applicable ? ?Comments: check out @ 12:00 ? ? ?Dr. Carlyle Dolly is Medical Director for [facility] ?

## 2021-12-05 NOTE — Progress Notes (Signed)
Daily Session Note ? ?Patient Details  ?Name: Anita Michael ?MRN: [id number] ?Date of Birth: 10/04/1965 ?Referring Provider:   ?Flowsheet Row CARDIAC REHAB PHASE II ORIENTATION from 09/27/2021 in [place]  ?Referring Provider Dr. Audie Box  ? ?  ? ? ?Encounter Date: 12/05/2021 ? ?Check In: ? Session Check In - 12/05/21 1100   ? ?  ? Check-In  ? Supervising physician immediately available to respond to emergencies [facility] MD immediately available   ? Physician(s) Dr Harl Bowie   ? Location [facility]   ? Staff Present Redge Gainer, BS, Exercise Physiologist;Debra Wynetta Emery, RN, Bjorn Loser, MS, ACSM-CEP, Exercise Physiologist;Macey Wurtz Windy Kalata, RN   ? Virtual Visit No   ? Medication changes reported     No   ? Fall or balance concerns reported    Yes   ? Comments Pt has fallen twice in the past year due to feeling dizzy and her legs giving out. She reports feeling dizzy frequently.   ? Tobacco Cessation No Change   ? Warm-up and Cool-down Performed as group-led instruction   ? Resistance Training Performed Yes   ? VAD Patient? No   ? PAD/SET Patient? No   ?  ? Pain Assessment  ? Currently in Pain? No/denies   ? Multiple Pain Sites No   ? ?  ?  ? ?  ? ? ?Capillary Blood Glucose: ?No results found for this or any previous visit (from the past 24 hour(s)). ? ? ? ?Social History  ? ?Tobacco Use  ?Smoking Status Some Days  ? Packs/day: 0.50  ? Types: Cigarettes  ? Last attempt to quit: 02/07/2019  ? Years since quitting: 2.8  ?Smokeless Tobacco Never  ? ? ?Goals Met:  ?Independence with exercise equipment ?Exercise tolerated well ?No report of concerns or symptoms today ?Strength training completed today ? ?Goals Unmet:  ?Not Applicable ? ?Comments: check out @ 12:00 ? ? ?Dr. Carlyle Dolly is Medical Director for [facility] ?

## 2021-12-07 ENCOUNTER — Encounter (HOSPITAL_COMMUNITY)
Admission: RE | Admit: 2021-12-07 | Discharge: 2021-12-07 | Disposition: A | Discharge status: Home / Self Care | Payer: Medicaid Other | Source: Ambulatory Visit | Attending: Cardiovascular Disease | Admitting: Cardiovascular Disease

## 2021-12-07 DIAGNOSIS — Z955 Presence of coronary angioplasty implant and graft: Secondary | ICD-10-CM

## 2021-12-07 DIAGNOSIS — I214 Non-ST elevation (NSTEMI) myocardial infarction: Secondary | ICD-10-CM

## 2021-12-07 NOTE — Progress Notes (Signed)
Daily Session Note  Patient Details  Name: Anita Michael MRN: [id number] Date of Birth: 1965/10/17 Referring Provider:   Flowsheet Row CARDIAC REHAB PHASE II ORIENTATION from 09/27/2021 in [place]  Referring Provider Dr. Audie Box       Encounter Date: 12/07/2021  Check In:  Session Check In - 12/07/21 1057       Check-In   Supervising physician immediately available to respond to emergencies [facility] MD immediately available    Physician(s) Dr Vanita Panda    Location [facility]    Staff Present Redge Gainer, BS, Exercise Physiologist;Phyllis Billingsley, RN;Palmer Shorey Hassell Done, RN, BSN    Virtual Visit No    Medication changes reported     No    Fall or balance concerns reported    Yes    Comments Pt has fallen twice in the past year due to feeling dizzy and her legs giving out. She reports feeling dizzy frequently.    Tobacco Cessation No Change    Warm-up and Cool-down Performed as group-led instruction    Resistance Training Performed Yes    VAD Patient? No    PAD/SET Patient? No      Pain Assessment   Currently in Pain? No/denies    Multiple Pain Sites No             Capillary Blood Glucose: No results found for this or any previous visit (from the past 24 hour(s)).    Social History   Tobacco Use  Smoking Status Some Days   Packs/day: 0.50   Types: Cigarettes   Last attempt to quit: 02/07/2019   Years since quitting: 2.8  Smokeless Tobacco Never    Goals Met:  Independence with exercise equipment Exercise tolerated well No report of concerns or symptoms today Strength training completed today  Goals Unmet:  Not Applicable  Comments: check out 1200.   Dr. Carlyle Dolly is Medical Director for [facility]

## 2021-12-10 ENCOUNTER — Encounter (HOSPITAL_COMMUNITY): Payer: Medicaid Other

## 2021-12-12 ENCOUNTER — Encounter (HOSPITAL_COMMUNITY)
Admission: RE | Admit: 2021-12-12 | Discharge: 2021-12-12 | Disposition: A | Discharge status: Home / Self Care | Payer: Medicaid Other | Source: Ambulatory Visit | Attending: Cardiovascular Disease | Admitting: Cardiovascular Disease

## 2021-12-12 DIAGNOSIS — I214 Non-ST elevation (NSTEMI) myocardial infarction: Secondary | ICD-10-CM | POA: Diagnosis not present

## 2021-12-12 DIAGNOSIS — Z955 Presence of coronary angioplasty implant and graft: Secondary | ICD-10-CM

## 2021-12-12 NOTE — Progress Notes (Signed)
Cardiac Individual Treatment Plan  Patient Details  Name: Anita Michael MRN: [id number] Date of Birth: 03/15/1966 Referring Provider:   Flowsheet Row CARDIAC REHAB PHASE II ORIENTATION from 09/27/2021 in [place]  Referring Provider Dr. Audie Box       Initial Encounter Date:  Flowsheet Row CARDIAC REHAB PHASE II ORIENTATION from 09/27/2021 in [place]  Date 10/04/21       Visit Diagnosis: NSTEMI (non-ST elevated myocardial infarction) [facility])  Status post coronary artery stent placement  Patient's Home Medications on Admission:  Current Outpatient Medications:    acetaminophen (TYLENOL) 500 MG tablet, Take 1,000 mg by mouth every 6 (six) hours as needed (for pain.)., Disp: , Rfl:    AIMOVIG 140 MG/ML SOAJ, Inject 140 mg into the skin every 30 (thirty) days., Disp: , Rfl:    amitriptyline (ELAVIL) 50 MG tablet, Take 50 mg by mouth at bedtime., Disp: , Rfl:    aspirin EC 81 MG EC tablet, Take 1 tablet (81 mg total) by mouth daily. Swallow whole., Disp: 30 tablet, Rfl: 11   atorvastatin (LIPITOR) 80 MG tablet, Take 1 tablet (80 mg total) by mouth daily at 6 PM., Disp: 90 tablet, Rfl: 1   cetirizine (ZYRTEC) 10 MG tablet, Take 10 mg by mouth daily as needed for allergies., Disp: , Rfl:    cholecalciferol (VITAMIN D3) 25 MCG (1000 UT) tablet, Take 1,000 Units by mouth daily in the afternoon., Disp: , Rfl:    cyclobenzaprine (FLEXERIL) 10 MG tablet, Take 10 mg by mouth 2 (two) times daily as needed for muscle spasms., Disp: , Rfl:    fluticasone (FLONASE) 50 MCG/ACT nasal spray, Place 2 sprays into both nostrils daily. (Patient taking differently: Place 2 sprays into both nostrils daily as needed for allergies.), Disp: 16 g, Rfl: 0   isosorbide mononitrate (IMDUR) 30 MG 24 hr tablet, Take 1 tablet (30 mg total) by mouth daily. (Patient taking differently: Take 30 mg by mouth daily in the afternoon.), Disp: 90 tablet, Rfl: 3   losartan (COZAAR)  100 MG tablet, TAKE ONE TABLET BY MOUTH ONCE DAILY., Disp: 90 tablet, Rfl: 2   nitroGLYCERIN (NITROSTAT) 0.4 MG SL tablet, Place 1 tablet (0.4 mg total) under the tongue every 5 (five) minutes x 3 doses as needed for chest pain., Disp: 25 tablet, Rfl: 2   nystatin (MYCOSTATIN/NYSTOP) powder, 1 application. 2 (two) times daily as needed (skin irritation)., Disp: , Rfl:    nystatin ointment (MYCOSTATIN), Apply 1 application. topically once a week., Disp: , Rfl:    PREMARIN vaginal cream, Place 1 application. vaginally once a week., Disp: , Rfl:    ticagrelor (BRILINTA) 90 MG TABS tablet, Take 1 tablet (90 mg total) by mouth 2 (two) times daily., Disp: 180 tablet, Rfl: 3  Past Medical History: Past Medical History:  Diagnosis Date   CAD (coronary artery disease)    a. s/p NSTEMI in 06/2021 with DES to mid-RCA. Residual disease along D1 and 1st Mrg with medical management recommended.   Essential hypertension    Fibromyalgia    Generalized headaches    History of stroke    Noted incidentally by brain MRI July 2022   Mitral regurgitation    a. moderate to severe by echo in 06/2021   Polycythemia    PVC's (premature ventricular contractions)     Tobacco Use: Social History   Tobacco Use  Smoking Status Some Days   Packs/day: 0.50   Types: Cigarettes   Last  attempt to quit: 02/07/2019   Years since quitting: 2.8  Smokeless Tobacco Never    Labs: Review Flowsheet        Latest Ref Rng & Units 07/06/2021 07/07/2021 11/26/2021  Labs for ITP Cardiac and Pulmonary Rehab  Cholestrol 0 - 200 mg/dL  195     180   131    LDL (calc) 0 - 99 mg/dL  116     100   65    HDL-C >40 mg/dL  36     34   36    Trlycerides <150 mg/dL  214     232   150    Hemoglobin A1c 4.8 - 5.6 %  6.2     PH, Arterial 7.350 - 7.450 7.275      PCO2 arterial 32.0 - 48.0 mmHg 55.9      Bicarbonate 20.0 - 28.0 mmol/L 25.9      TCO2 22 - 32 mmol/L 28      Acid-base deficit 0.0 - 2.0 mmol/L 2.0      O2 Saturation  % 78.0          Multiple values from one day are sorted in reverse-chronological order        Capillary Blood Glucose: No results found for: GLUCAP   Exercise Target Goals: Exercise Program Goal: Individual exercise prescription set using results from initial 6 min walk test and THRR while considering  patient's activity barriers and safety.   Exercise Prescription Goal: Starting with aerobic activity 30 plus minutes a day, 3 days per week for initial exercise prescription. Provide home exercise prescription and guidelines that participant acknowledges understanding prior to discharge.  Activity Barriers & Risk Stratification:  Activity Barriers & Cardiac Risk Stratification - 09/27/21 1326       Activity Barriers & Cardiac Risk Stratification   Activity Barriers Fibromyalgia;Deconditioning;Muscular Weakness;Shortness of Breath;History of Falls    Cardiac Risk Stratification High             6 Minute Walk:  6 Minute Walk     Row Name 09/27/21 1418         6 Minute Walk   Phase Initial     Distance 700 feet     Walk Time 6 minutes     # of Rest Breaks 1     MPH 1.32     METS 2.26     RPE 13     VO2 Peak 7.9     Symptoms Yes (comment)     Comments one standing rest break for 30 seconds due to being tired     Resting HR 80 bpm     Resting BP 132/84     Resting Oxygen Saturation  95 %     Exercise Oxygen Saturation  during 6 min walk 98 %     Max Ex. HR 87 bpm     Max Ex. BP 160/80     2 Minute Post BP 138/80              Oxygen Initial Assessment:   Oxygen Re-Evaluation:   Oxygen Discharge (Final Oxygen Re-Evaluation):   Initial Exercise Prescription:  Initial Exercise Prescription - 09/27/21 1400       Date of Initial Exercise RX and Referring Provider   Date 10/04/21    Referring Provider Dr. Audie Box    Expected Discharge Date 12/21/21      NuStep   Level 1    SPM 60    Minutes  22      Arm Ergometer   Level 1    RPM 60     Minutes 17      Prescription Details   Frequency (times per week) 3    Duration Progress to 30 minutes of continuous aerobic without signs/symptoms of physical distress      Intensity   THRR 40-80% of Max Heartrate 65-131    Ratings of Perceived Exertion 11-13    Perceived Dyspnea 0-4      Resistance Training   Training Prescription Yes    Weight 2    Reps 10-15             Perform Capillary Blood Glucose checks as needed.  Exercise Prescription Changes:   Exercise Prescription Changes     Row Name 10/01/21 1156 10/15/21 1200 10/22/21 1100 10/24/21 1507 11/05/21 1150     Response to Exercise   Blood Pressure (Admit) 122/90 122/84 -- 112/72 132/70   Blood Pressure (Exercise) 140/96 106/70 -- 118/70 106/80   Blood Pressure (Exit) 126/88 118/76 -- 124/68 106/60   Heart Rate (Admit) 89 bpm 110 bpm -- 85 bpm 86 bpm   Heart Rate (Exercise) 101 bpm 119 bpm -- 91 bpm 92 bpm   Heart Rate (Exit) 94 bpm 100 bpm -- 81 bpm 88 bpm   Rating of Perceived Exertion (Exercise) 13 12 -- 12 12   Duration Continue with 30 min of aerobic exercise without signs/symptoms of physical distress. Continue with 30 min of aerobic exercise without signs/symptoms of physical distress. -- Continue with 30 min of aerobic exercise without signs/symptoms of physical distress. Continue with 30 min of aerobic exercise without signs/symptoms of physical distress.   Intensity THRR unchanged THRR unchanged -- THRR unchanged THRR unchanged     Progression   Progression Continue to progress workloads to maintain intensity without signs/symptoms of physical distress. Continue to progress workloads to maintain intensity without signs/symptoms of physical distress. -- Continue to progress workloads to maintain intensity without signs/symptoms of physical distress. Continue to progress workloads to maintain intensity without signs/symptoms of physical distress.     Resistance Training   Training Prescription Yes Yes --  Yes Yes   Weight 2 2 -- 2 2   Reps 10-15 10-15 -- 10-15 10-15   Time 10 Minutes 10 Minutes -- 10 Minutes 10 Minutes     NuStep   Level 1 1 -- 1 2   SPM 54 62 -- 70 60   Minutes 22 22 -- 22 22   METs 1.73 1.76 -- 1.83 1.72     Arm Ergometer   Level 1 1 -- 1 1   RPM 32 58 -- 33 32   Minutes 17 17 -- 17 17   METs 1.48 1.36 -- 1.48 1.72     Home Exercise Plan   Plans to continue exercise at -- -- Home (comment) -- --   Frequency -- -- Add 2 additional days to program exercise sessions. -- --   Initial Home Exercises Provided -- -- 10/21/21 -- --    [place] Name 11/26/21 1200 12/07/21 1200           Response to Exercise   Blood Pressure (Admit) 110/70 130/72      Blood Pressure (Exercise) 110/68 128/68      Blood Pressure (Exit) 98/50 112/68      Heart Rate (Admit) 85 bpm 78 bpm      Heart Rate (Exercise) 105 bpm 95 bpm  Heart Rate (Exit) 94 bpm 77 bpm      Rating of Perceived Exertion (Exercise) 12 13      Duration Continue with 30 min of aerobic exercise without signs/symptoms of physical distress. Continue with 30 min of aerobic exercise without signs/symptoms of physical distress.      Intensity THRR unchanged THRR unchanged        Progression   Progression Continue to progress workloads to maintain intensity without signs/symptoms of physical distress. Continue to progress workloads to maintain intensity without signs/symptoms of physical distress.        Resistance Training   Training Prescription Yes Yes      Weight 2 3      Reps 10-15 10-15      Time 10 Minutes 10 Minutes        NuStep   Level 1 1      SPM 63 66      Minutes 22 22      METs 1.79 1.79        Arm Ergometer   Level 1 2      RPM 34 31      Minutes 17 17      METs 1.49 1.5               Exercise Comments:   Exercise Comments     Row Name 10/22/21 1143           Exercise Comments home exercise reviewed                Exercise Goals and Review:   Exercise Goals     Row  Name 09/27/21 1422 10/15/21 1249 11/05/21 1150 12/11/21 0900       Exercise Goals   Increase Physical Activity Yes Yes Yes Yes    Intervention Provide advice, education, support and counseling about physical activity/exercise needs.;Develop an individualized exercise prescription for aerobic and resistive training based on initial evaluation findings, risk stratification, comorbidities and participant's personal goals. Provide advice, education, support and counseling about physical activity/exercise needs.;Develop an individualized exercise prescription for aerobic and resistive training based on initial evaluation findings, risk stratification, comorbidities and participant's personal goals. Provide advice, education, support and counseling about physical activity/exercise needs.;Develop an individualized exercise prescription for aerobic and resistive training based on initial evaluation findings, risk stratification, comorbidities and participant's personal goals. Provide advice, education, support and counseling about physical activity/exercise needs.;Develop an individualized exercise prescription for aerobic and resistive training based on initial evaluation findings, risk stratification, comorbidities and participant's personal goals.    Expected Outcomes Short Term: Attend rehab on a regular basis to increase amount of physical activity.;Long Term: Add in home exercise to make exercise part of routine and to increase amount of physical activity.;Long Term: Exercising regularly at least 3-5 days a week. Short Term: Attend rehab on a regular basis to increase amount of physical activity.;Long Term: Add in home exercise to make exercise part of routine and to increase amount of physical activity.;Long Term: Exercising regularly at least 3-5 days a week. Short Term: Attend rehab on a regular basis to increase amount of physical activity.;Long Term: Add in home exercise to make exercise part of routine and  to increase amount of physical activity.;Long Term: Exercising regularly at least 3-5 days a week. Short Term: Attend rehab on a regular basis to increase amount of physical activity.;Long Term: Add in home exercise to make exercise part of routine and to increase amount of physical activity.;Long Term: Exercising regularly  at least 3-5 days a week.    Increase Strength and Stamina Yes Yes Yes Yes    Intervention Provide advice, education, support and counseling about physical activity/exercise needs.;Develop an individualized exercise prescription for aerobic and resistive training based on initial evaluation findings, risk stratification, comorbidities and participant's personal goals. Provide advice, education, support and counseling about physical activity/exercise needs.;Develop an individualized exercise prescription for aerobic and resistive training based on initial evaluation findings, risk stratification, comorbidities and participant's personal goals. Provide advice, education, support and counseling about physical activity/exercise needs.;Develop an individualized exercise prescription for aerobic and resistive training based on initial evaluation findings, risk stratification, comorbidities and participant's personal goals. Provide advice, education, support and counseling about physical activity/exercise needs.;Develop an individualized exercise prescription for aerobic and resistive training based on initial evaluation findings, risk stratification, comorbidities and participant's personal goals.    Expected Outcomes Short Term: Increase workloads from initial exercise prescription for resistance, speed, and METs.;Short Term: Perform resistance training exercises routinely during rehab and add in resistance training at home;Long Term: Improve cardiorespiratory fitness, muscular endurance and strength as measured by increased METs and functional capacity (6MWT) Short Term: Increase workloads from  initial exercise prescription for resistance, speed, and METs.;Short Term: Perform resistance training exercises routinely during rehab and add in resistance training at home;Long Term: Improve cardiorespiratory fitness, muscular endurance and strength as measured by increased METs and functional capacity (6MWT) Short Term: Increase workloads from initial exercise prescription for resistance, speed, and METs.;Short Term: Perform resistance training exercises routinely during rehab and add in resistance training at home;Long Term: Improve cardiorespiratory fitness, muscular endurance and strength as measured by increased METs and functional capacity (6MWT) Short Term: Increase workloads from initial exercise prescription for resistance, speed, and METs.;Short Term: Perform resistance training exercises routinely during rehab and add in resistance training at home;Long Term: Improve cardiorespiratory fitness, muscular endurance and strength as measured by increased METs and functional capacity (6MWT)    Able to understand and use rate of perceived exertion (RPE) scale Yes Yes Yes Yes    Intervention Provide education and explanation on how to use RPE scale Provide education and explanation on how to use RPE scale Provide education and explanation on how to use RPE scale Provide education and explanation on how to use RPE scale    Expected Outcomes Short Term: Able to use RPE daily in rehab to express subjective intensity level;Long Term:  Able to use RPE to guide intensity level when exercising independently Short Term: Able to use RPE daily in rehab to express subjective intensity level;Long Term:  Able to use RPE to guide intensity level when exercising independently Short Term: Able to use RPE daily in rehab to express subjective intensity level;Long Term:  Able to use RPE to guide intensity level when exercising independently Short Term: Able to use RPE daily in rehab to express subjective intensity level;Long  Term:  Able to use RPE to guide intensity level when exercising independently    Knowledge and understanding of Target Heart Rate Range (THRR) Yes Yes Yes Yes    Intervention Provide education and explanation of THRR including how the numbers were predicted and where they are located for reference Provide education and explanation of THRR including how the numbers were predicted and where they are located for reference Provide education and explanation of THRR including how the numbers were predicted and where they are located for reference Provide education and explanation of THRR including how the numbers were predicted and where they are  located for reference    Expected Outcomes Short Term: Able to state/look up THRR;Short Term: Able to use daily as guideline for intensity in rehab;Long Term: Able to use THRR to govern intensity when exercising independently Short Term: Able to state/look up THRR;Short Term: Able to use daily as guideline for intensity in rehab;Long Term: Able to use THRR to govern intensity when exercising independently Short Term: Able to state/look up THRR;Short Term: Able to use daily as guideline for intensity in rehab;Long Term: Able to use THRR to govern intensity when exercising independently Short Term: Able to state/look up THRR;Short Term: Able to use daily as guideline for intensity in rehab;Long Term: Able to use THRR to govern intensity when exercising independently    Able to check pulse independently Yes Yes Yes Yes    Intervention Provide education and demonstration on how to check pulse in carotid and radial arteries.;Review the importance of being able to check your own pulse for safety during independent exercise Provide education and demonstration on how to check pulse in carotid and radial arteries.;Review the importance of being able to check your own pulse for safety during independent exercise Provide education and demonstration on how to check pulse in carotid and  radial arteries.;Review the importance of being able to check your own pulse for safety during independent exercise Provide education and demonstration on how to check pulse in carotid and radial arteries.;Review the importance of being able to check your own pulse for safety during independent exercise    Expected Outcomes Short Term: Able to explain why pulse checking is important during independent exercise;Long Term: Able to check pulse independently and accurately Short Term: Able to explain why pulse checking is important during independent exercise;Long Term: Able to check pulse independently and accurately Short Term: Able to explain why pulse checking is important during independent exercise;Long Term: Able to check pulse independently and accurately Short Term: Able to explain why pulse checking is important during independent exercise;Long Term: Able to check pulse independently and accurately    Understanding of Exercise Prescription Yes Yes Yes Yes    Intervention Provide education, explanation, and written materials on patient's individual exercise prescription Provide education, explanation, and written materials on patient's individual exercise prescription Provide education, explanation, and written materials on patient's individual exercise prescription Provide education, explanation, and written materials on patient's individual exercise prescription    Expected Outcomes Short Term: Able to explain program exercise prescription;Long Term: Able to explain home exercise prescription to exercise independently Short Term: Able to explain program exercise prescription;Long Term: Able to explain home exercise prescription to exercise independently Short Term: Able to explain program exercise prescription;Long Term: Able to explain home exercise prescription to exercise independently Short Term: Able to explain program exercise prescription;Long Term: Able to explain home exercise prescription to  exercise independently             Exercise Goals Re-Evaluation :  Exercise Goals Re-Evaluation     Row Name 10/15/21 1250 11/05/21 1150 12/11/21 0900         Exercise Goal Re-Evaluation   Exercise Goals Review Increase Physical Activity;Increase Strength and Stamina;Able to understand and use rate of perceived exertion (RPE) scale;Knowledge and understanding of Target Heart Rate Range (THRR);Able to check pulse independently;Understanding of Exercise Prescription Increase Physical Activity;Increase Strength and Stamina;Able to understand and use rate of perceived exertion (RPE) scale;Knowledge and understanding of Target Heart Rate Range (THRR);Able to check pulse independently;Understanding of Exercise Prescription Increase Strength and Stamina;Increase Physical Activity;Able to  understand and use rate of perceived exertion (RPE) scale;Knowledge and understanding of Target Heart Rate Range (THRR);Able to check pulse independently;Understanding of Exercise Prescription     Comments Pt has completed 5 sessions of cardiac rehab. She is deconditioned and progression will take time. She is currently exercising at 1.76 METs on the stepper. Will continue to monitor and progress as able. Pt has completed 10 sessions of cardiac rehab. Her progress of workload has increased slighly due to her being deconditioned. She is currently exercising at 1.72 METs on the stepper. Will continue to monitor and progress as able. Pt has completed 17 sessions of cardiac rehab. She has progressed her workload byone. She continues to be deconditioned and progressing at a slow rate. She is not motivated when she is in class. She is currently exercising at 1.79 METs on the stepper. Will coninue to monitor and progress as able.     Expected Outcomes Through exercise at home and at rehab, the patient will meet their stated goals. Through exercise at home and at rehab, the patient will meet their stated goals. Through exercise  at home and at rehab, the patient will meet their stated goals.               Discharge Exercise Prescription (Final Exercise Prescription Changes):  Exercise Prescription Changes - 12/07/21 1200       Response to Exercise   Blood Pressure (Admit) 130/72    Blood Pressure (Exercise) 128/68    Blood Pressure (Exit) 112/68    Heart Rate (Admit) 78 bpm    Heart Rate (Exercise) 95 bpm    Heart Rate (Exit) 77 bpm    Rating of Perceived Exertion (Exercise) 13    Duration Continue with 30 min of aerobic exercise without signs/symptoms of physical distress.    Intensity THRR unchanged      Progression   Progression Continue to progress workloads to maintain intensity without signs/symptoms of physical distress.      Resistance Training   Training Prescription Yes    Weight 3    Reps 10-15    Time 10 Minutes      NuStep   Level 1    SPM 66    Minutes 22    METs 1.79      Arm Ergometer   Level 2    RPM 31    Minutes 17    METs 1.5             Nutrition:  Target Goals: Understanding of nutrition guidelines, daily intake of sodium '1500mg'$ , cholesterol '200mg'$ , calories 30% from fat and 7% or less from saturated fats, daily to have 5 or more servings of fruits and vegetables.  Biometrics:  Pre Biometrics - 09/27/21 1422       Pre Biometrics   Height 5' 7.5" (1.715 m)    Weight 87 kg    Waist Circumference 48 inches    Hip Circumference 40 inches    Waist to Hip Ratio 1.2 %    BMI (Calculated) 29.58    Triceps Skinfold 18 mm    % Body Fat 41 %    Grip Strength 14.3 kg    Flexibility 0 in    Single Leg Stand 0 seconds              Nutrition Therapy Plan and Nutrition Goals:  Nutrition Therapy & Goals - 09/27/21 1336       Intervention Plan   Intervention Nutrition handout(s) given to patient.  Expected Outcomes Short Term Goal: Understand basic principles of dietary content, such as calories, fat, sodium, cholesterol and nutrients.              Nutrition Assessments:  Nutrition Assessments - 09/27/21 1337       MEDFICTS Scores   Pre Score 18            MEDIFICTS Score Key: ?70 Need to make dietary changes  40-70 Heart Healthy Diet ? 40 Therapeutic Level Cholesterol Diet   Picture Your Plate Scores: <14 Unhealthy dietary pattern with much room for improvement. 41-50 Dietary pattern unlikely to meet recommendations for good health and room for improvement. 51-60 More healthful dietary pattern, with some room for improvement.  >60 Healthy dietary pattern, although there may be some specific behaviors that could be improved.    Nutrition Goals Re-Evaluation:   Nutrition Goals Discharge (Final Nutrition Goals Re-Evaluation):   Psychosocial: Target Goals: Acknowledge presence or absence of significant depression and/or stress, maximize coping skills, provide positive support system. Participant is able to verbalize types and ability to use techniques and skills needed for reducing stress and depression.  Initial Review & Psychosocial Screening:  Initial Psych Review & Screening - 12/03/21 1232       Initial Review   Source of Stress Concerns --    Comments --             Quality of Life Scores:  Quality of Life - 09/27/21 1423       Quality of Life   Select Quality of Life      Quality of Life Scores   Health/Function Pre 19.28 %    Socioeconomic Pre 28.5 %    Psych/Spiritual Pre 22.8 %    Family Pre 27 %    GLOBAL Pre 22.78 %            Scores of 19 and below usually indicate a poorer quality of life in these areas.  A difference of  2-3 points is a clinically meaningful difference.  A difference of 2-3 points in the total score of the Quality of Life Index has been associated with significant improvement in overall quality of life, self-image, physical symptoms, and general health in studies assessing change in quality of life.  PHQ-9: Review Flowsheet        09/27/2021 07/27/2021   Depression screen PHQ 2/9  Decreased Interest 0 0  Down, Depressed, Hopeless 0 0  PHQ - 2 Score 0 0  Altered sleeping 0 0  Tired, decreased energy 3 3  Change in appetite 0 1  Feeling bad or failure about yourself  0 0  Trouble concentrating 0 0  Moving slowly or fidgety/restless 0 0  Suicidal thoughts 0 0  PHQ-9 Score 3 4  Difficult doing work/chores Very difficult          Interpretation of Total Score  Total Score Depression Severity:  1-4 = Minimal depression, 5-9 = Mild depression, 10-14 = Moderate depression, 15-19 = Moderately severe depression, 20-27 = Severe depression   Psychosocial Evaluation and Intervention:  Psychosocial Evaluation - 09/27/21 1404       Psychosocial Evaluation & Interventions   Interventions Encouraged to exercise with the program and follow exercise prescription    Comments Pt has no barriers to participating in CR. She has no identifiable psychosocial issues. She does report finances as a stressor, as she is currently out of work since her NSTEMI and stent. She reports stress especially around the time  that her bills are due. She states that she has cut back on her smoking. She previously smoked about a half a pack per day. She states that she now goes through a pack per month. She states that she only smokes when she is stressed. However, she does currently smell like cigarettes. It is unclear if this is from her, or if she has been around someone who smokes. She scored a 3 on her PHQ-9 due to her lack of energy. She states since her NSTEMI that she has not had any energy and it has been hard for her to do anything. She reports that the months of 09/22/2022 and March are hard for her. Her husband passed away 5 years ago in 22-Sep-2022 and his birthday is in March. She is currently raising her 83 year old grandson, who she adopted when he was 41 year old. She reports that he has autism and this can be challenging for her at times. She reports that she has a  good support system with her sister and her children. She has lost about 30 lbs since her NSTEMI due to eating better. She reports that her goals are to continue to lose weight, decrease her SOB with exertion, and to gain enough energy to be able to go back to work. She is optimistic about the program, but does note that her fibromyalgia and frequent migraine headaches may impact her ability to complete the program. She is hopeful that she will be able to.    Expected Outcomes Pt will continue to have no identifiable psychosocial issues and her stressors will be handled in a healthy manner.    Continue Psychosocial Services  No Follow up required             Psychosocial Re-Evaluation:  Psychosocial Re-Evaluation     [place] Name 10/08/21 1310 11/05/21 1054 12/03/21 1232         Psychosocial Re-Evaluation   Current issues with Current Stress Concerns Current Stress Concerns Current Stress Concerns     Comments Patient is new to the program completing 3 sessions. She continues to have no psychosocial barriers identified. She continues to have financial concerns due to being out of work. Will continue to monitor. Patient has completed 9 sessions. She continues to have no psychosocial barriers identified. She continues to have financial concerns due to being out of work. Will continue to monitor. Patient has completed 14 sessions. She continues to have no psychosocial barriers identified. She continues to have financial concerns due to being out of work but is able to manage. Will continue to monitor.     Expected Outcomes Patient will continue to have no psychosocial barriers identified. Patient will continue to have no psychosocial barriers identified. Patient will continue to have no psychosocial barriers identified.     Interventions Encouraged to attend Cardiac Rehabilitation for the exercise;Stress management education;Relaxation education Encouraged to attend Cardiac Rehabilitation for the  exercise;Stress management education;Relaxation education Encouraged to attend Cardiac Rehabilitation for the exercise;Stress management education;Relaxation education     Continue Psychosocial Services  No Follow up required No Follow up required No Follow up required       Initial Review   Source of Stress Concerns Financial Financial --     Comments She is stressed about finances, as she is currently out of work due to her health. She is stressed about finances, as she is currently out of work due to her health. --  Psychosocial Discharge (Final Psychosocial Re-Evaluation):  Psychosocial Re-Evaluation - 12/03/21 1232       Psychosocial Re-Evaluation   Current issues with Current Stress Concerns    Comments Patient has completed 14 sessions. She continues to have no psychosocial barriers identified. She continues to have financial concerns due to being out of work but is able to manage. Will continue to monitor.    Expected Outcomes Patient will continue to have no psychosocial barriers identified.    Interventions Encouraged to attend Cardiac Rehabilitation for the exercise;Stress management education;Relaxation education    Continue Psychosocial Services  No Follow up required             Vocational Rehabilitation: Provide vocational rehab assistance to qualifying candidates.   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            Education: Education Goals: Education classes will be provided on a weekly basis, covering required topics. Participant will state understanding/return demonstration of topics presented.  Learning Barriers/Preferences:  Learning Barriers/Preferences - 09/27/21 1338        Learning Barriers/Preferences   Learning Barriers None    Learning Preferences Audio;Computer/Internet;Group Instruction;Individual Instruction;Pictoral;Skilled Demonstration;Verbal Instruction;Video;Written Material             Education Topics: Hypertension, Hypertension Reduction -Define heart disease and high blood pressure. Discus how high blood pressure affects the body and ways to reduce high blood pressure.   Exercise and Your Heart -Discuss why it is important to exercise, the FITT principles of exercise, normal and abnormal responses to exercise, and how to exercise safely. Flowsheet Row CARDIAC REHAB PHASE II EXERCISE from 12/05/2021 in [place]  Date 10/31/21  Educator [place]  Instruction Review Code 1- Verbalizes Understanding       Angina -Discuss definition of angina, causes of angina, treatment of angina, and how to decrease risk of having angina.   Cardiac Medications -Review what the following cardiac medications are used for, how they affect the body, and side effects that may occur when taking the medications.  Medications include Aspirin, Beta blockers, calcium channel blockers, ACE Inhibitors, angiotensin receptor blockers, diuretics, digoxin, and antihyperlipidemics.   Congestive Heart Failure -Discuss the definition of CHF, how to live with CHF, the signs and symptoms of CHF, and how keep track of weight and sodium intake. Flowsheet Row CARDIAC REHAB PHASE II EXERCISE from 12/05/2021 in [place]  Date 11/21/21  Educator hj  Instruction Review Code 2- Demonstrated Understanding       Heart Disease and Intimacy -Discus the effect sexual activity has on the heart, how changes occur during intimacy as we age, and safety during sexual activity.   Smoking Cessation / COPD -Discuss different methods to quit smoking, the health benefits of quitting smoking, and the definition of COPD. Flowsheet Row CARDIAC  REHAB PHASE II EXERCISE from 12/05/2021 in [place]  Date 12/05/21  Educator pb  Instruction Review Code 1- Verbalizes Understanding       Nutrition I: Fats -Discuss the types of cholesterol, what cholesterol does to the heart, and how cholesterol levels can be controlled.   Nutrition II: Labels -Discuss the different components of food labels and how to read food label  Heart Parts/Heart Disease and PAD -Discuss the anatomy of the heart, the pathway of blood circulation through the heart, and these are affected by heart disease.   Stress I: Signs and Symptoms -Discuss the causes of stress, how stress may lead to anxiety and depression, and ways to limit stress.   Stress II: Relaxation -Discuss different types of relaxation techniques to limit stress. Flowsheet Row CARDIAC REHAB PHASE II EXERCISE from 12/05/2021 in [place]  Date 10/24/21  Educator DM  Instruction Review Code 1- Verbalizes Understanding       Warning Signs of Stroke / TIA -Discuss definition of a stroke, what the signs and symptoms are of a stroke, and how to identify when someone is having stroke.   Knowledge Questionnaire Score:  Knowledge Questionnaire Score - 09/27/21 1338       Knowledge Questionnaire Score   Pre Score 14/24             Core Components/Risk Factors/Patient Goals at Admission:  Personal Goals and Risk Factors at Admission - 09/27/21 1343       Core Components/Risk Factors/Patient Goals on Admission    Weight Management Yes;Weight Loss;Weight Maintenance    Intervention Weight Management: Develop a combined nutrition and exercise program designed to reach desired caloric intake, while maintaining appropriate intake of nutrient and fiber, sodium and fats, and appropriate energy expenditure required for the weight goal.;Weight Management: Provide education and appropriate resources to help participant work on and attain dietary  goals.;Weight Management/Obesity: Establish reasonable short term and long term weight goals.;Obesity: Provide education and appropriate resources to help participant work on and attain dietary goals.    Expected Outcomes Short Term: Continue to assess and modify interventions until short term weight is achieved;Long Term: Adherence to nutrition and physical activity/exercise program aimed toward attainment of established weight goal;Weight Maintenance: Understanding of the daily nutrition guidelines, which includes 25-35% calories from fat, 7% or less cal from saturated fats, less than '200mg'$  cholesterol, less than 1.5gm of sodium, & 5 or more servings of fruits and vegetables daily;Understanding recommendations for meals to include 15-35% energy as protein, 25-35% energy from fat, 35-60% energy from carbohydrates, less than '200mg'$  of dietary cholesterol, 20-35 gm of total fiber daily;Weight Loss: Understanding of general recommendations for a balanced deficit meal plan, which promotes 1-2 lb weight loss per week and includes a negative energy balance of [phone] kcal/d;Understanding of distribution of calorie intake throughout the day with the consumption of 4-5 meals/snacks    Tobacco Cessation Yes    Number of packs per day 1 pack per month    Intervention Assist the participant in steps to quit. Provide individualized education and counseling about committing to Tobacco Cessation, relapse prevention, and pharmacological support that can be provided by physician.;Advice worker, assist with locating and accessing local/national Quit Smoking programs, and support quit date choice.    Expected Outcomes Short Term: Will demonstrate readiness to quit, by selecting a quit date.;Short Term: Will quit all tobacco product use, adhering to prevention of relapse plan.;Long Term: Complete abstinence from all tobacco products for at least 12 months from quit date.    Improve shortness of breath with ADL's  Yes    Intervention Provide education, individualized exercise plan and daily activity instruction to help decrease symptoms of SOB with activities of daily living.    Expected Outcomes Short Term: Improve cardiorespiratory fitness to achieve a reduction of symptoms when performing ADLs;Long Term: Be able to perform more ADLs without symptoms or  delay the onset of symptoms    Personal Goal Other Yes    Personal Goal Return to work    Intervention Attend cardiac rehab and begin a home exercise program.    Expected Outcomes Her energy levels will improve to the point where she can return to work.             Core Components/Risk Factors/Patient Goals Review:   Goals and Risk Factor Review     Row Name 10/08/21 1313 11/05/21 1055 12/03/21 1233         Core Components/Risk Factors/Patient Goals Review   Personal Goals Review Weight Management/Obesity;Tobacco Cessation;Improve shortness of breath with ADL's;Lipids;Hypertension Weight Management/Obesity;Tobacco Cessation;Improve shortness of breath with ADL's;Lipids;Hypertension Weight Management/Obesity;Tobacco Cessation;Improve shortness of breath with ADL's;Lipids;Hypertension     Review Patient was referred to CR wtih NSTEMI/DES. She has multiple risk factors for CAD and is participating in the program for risk modification. She has completed 3 sessions and her current weight is 196.6 lbs up 2 lbs from her initial weight. Her personal goals for the program are to lose weight; decrease her SOB with exertion and gain enough energy to be able to go back to work. We will continue to monitor her progress as she works towards meeting these goals. Patient has completed 9 sessions and her current weight is 191.0 lbs down 5.6 lbs since last 30 day review. She is doing well in the program with consistent attendance and progressions. Her blood pressure is at goal. She had an ECHO 3/27 which showed normal heart function with mild leakage in MV. Her personal  goals for the program continue to be to lose weight; decrease her SOB with exertion and gain enough energy to be able to go back to work. We will continue to monitor her progress as she works towards meeting these goals. Patient has completed 14 sessions and her current weight is 189.7 lbs down 3 lbs since last 30 day review. She continues to do well in the program with progressions. Her attendance is inconsistent. She is frequently absent and does not call which impedes her progres in the program. Her blood pressure is at goal. She  Her personal goals for the program continue to be to lose weight; decrease her SOB with exertion and gain enough energy to be able to go back to work. We will continue to monitor her progress as she works towards meeting these goals.     Expected Outcomes Patient will complete the program meeting both personal and program goals. Patient will complete the program meeting both personal and program goals. Patient will complete the program meeting both personal and program goals.              Core Components/Risk Factors/Patient Goals at Discharge (Final Review):   Goals and Risk Factor Review - 12/03/21 1233       Core Components/Risk Factors/Patient Goals Review   Personal Goals Review Weight Management/Obesity;Tobacco Cessation;Improve shortness of breath with ADL's;Lipids;Hypertension    Review Patient has completed 14 sessions and her current weight is 189.7 lbs down 3 lbs since last 30 day review. She continues to do well in the program with progressions. Her attendance is inconsistent. She is frequently absent and does not call which impedes her progres in the program. Her blood pressure is at goal. She  Her personal goals for the program continue to be to lose weight; decrease her SOB with exertion and gain enough energy to be able to go back to work. We  will continue to monitor her progress as she works towards meeting these goals.    Expected Outcomes Patient will  complete the program meeting both personal and program goals.             ITP Comments:   Comments: ITP REVIEW Pt is making expected progress toward Cardiac Rehab goals after completing 17 sessions. Recommend continued exercise, life style modification, education, and increased stamina and strength.

## 2021-12-12 NOTE — Progress Notes (Signed)
Daily Session Note  Patient Details  Name: Anita Michael MRN: [id number] Date of Birth: 09-Oct-1965 Referring Provider:   Flowsheet Row CARDIAC REHAB PHASE II ORIENTATION from 09/27/2021 in [facility]  Referring Provider Dr. Audie Box       Encounter Date: 12/12/2021  Check In:  Session Check In - 12/12/21 1100       Check-In   Supervising physician immediately available to respond to emergencies [facility] MD immediately available    Physician(s) Dr Domenic Polite    Location [facility]    Staff Present Redge Gainer, BS, Exercise Physiologist;Deserai Cansler Hassell Done, RN, Jennye Moccasin, RN, BSN    Virtual Visit No    Medication changes reported     No    Fall or balance concerns reported    Yes    Comments Pt has fallen twice in the past year due to feeling dizzy and her legs giving out. She reports feeling dizzy frequently.    Tobacco Cessation No Change    Warm-up and Cool-down Performed as group-led instruction    Resistance Training Performed Yes    VAD Patient? No    PAD/SET Patient? No      Pain Assessment   Currently in Pain? No/denies    Multiple Pain Sites No             Capillary Blood Glucose: No results found for this or any previous visit (from the past 24 hour(s)).    Social History   Tobacco Use  Smoking Status Some Days   Packs/day: 0.50   Types: Cigarettes   Last attempt to quit: 02/07/2019   Years since quitting: 2.8  Smokeless Tobacco Never    Goals Met:  Independence with exercise equipment Exercise tolerated well No report of concerns or symptoms today Strength training completed today  Goals Unmet:  Not Applicable  Comments: checkout at 1200.   Dr. Carlyle Dolly is Medical Director for [facility] Card[facility]

## 2021-12-14 ENCOUNTER — Encounter (HOSPITAL_COMMUNITY)
Admission: RE | Admit: 2021-12-14 | Discharge: 2021-12-14 | Disposition: A | Discharge status: Home / Self Care | Payer: Medicaid Other | Source: Ambulatory Visit | Attending: Cardiovascular Disease | Admitting: Cardiovascular Disease

## 2021-12-14 DIAGNOSIS — I214 Non-ST elevation (NSTEMI) myocardial infarction: Secondary | ICD-10-CM

## 2021-12-14 DIAGNOSIS — Z955 Presence of coronary angioplasty implant and graft: Secondary | ICD-10-CM

## 2021-12-17 ENCOUNTER — Encounter (HOSPITAL_COMMUNITY): Payer: Medicaid Other

## 2021-12-19 ENCOUNTER — Encounter (HOSPITAL_COMMUNITY)
Admission: RE | Admit: 2021-12-19 | Discharge: 2021-12-19 | Disposition: A | Discharge status: Home / Self Care | Payer: Medicaid Other | Source: Ambulatory Visit | Attending: Cardiovascular Disease | Admitting: Cardiovascular Disease

## 2021-12-19 DIAGNOSIS — I214 Non-ST elevation (NSTEMI) myocardial infarction: Secondary | ICD-10-CM | POA: Diagnosis not present

## 2021-12-19 NOTE — Progress Notes (Signed)
Daily Session Note  Patient Details  Name: Anita Michael MRN: [id number] Date of Birth: 11/02/1965 Referring Provider:   Flowsheet Row CARDIAC REHAB PHASE II ORIENTATION from 09/27/2021 in [facility]  Referring Provider Dr. O'Neal       Encounter Date: 12/19/2021  Check In:  Session Check In - 12/19/21 1056       Check-In   Supervising physician immediately available to respond to emergencies [facility] MD immediately available    Physician(s) Dr Branch    Location [facility]    Staff Present Debra Johnson, RN, BSN;Daphyne Martin, RN, BSN;Dalton Fletcher, MS, ACSM-CEP, Exercise Physiologist    Virtual Visit No    Medication changes reported     No    Fall or balance concerns reported    No    Tobacco Cessation No Change    Warm-up and Cool-down Performed as group-led instruction    Resistance Training Performed Yes    VAD Patient? No    PAD/SET Patient? No      Pain Assessment   Currently in Pain? No/denies    Multiple Pain Sites No             Capillary Blood Glucose: No results found for this or any previous visit (from the past 24 hour(s)).    Social History   Tobacco Use  Smoking Status Some Days   Packs/day: 0.50   Types: Cigarettes   Last attempt to quit: 02/07/2019   Years since quitting: 2.8  Smokeless Tobacco Never    Goals Met:  Independence with exercise equipment Exercise tolerated well No report of concerns or symptoms today Strength training completed today  Goals Unmet:  Not Applicable  Comments: Checkout at 1200.   Dr. Jonathan Branch is Medical Director for [facility] 

## 2021-12-21 ENCOUNTER — Encounter (HOSPITAL_COMMUNITY)
Admission: RE | Admit: 2021-12-21 | Discharge: 2021-12-21 | Disposition: A | Discharge status: Home / Self Care | Payer: Medicaid Other | Source: Ambulatory Visit | Attending: Cardiovascular Disease | Admitting: Cardiovascular Disease

## 2021-12-21 VITALS — Ht 67.5 in | Wt 184.1 lb

## 2021-12-21 DIAGNOSIS — I214 Non-ST elevation (NSTEMI) myocardial infarction: Secondary | ICD-10-CM | POA: Diagnosis present

## 2021-12-21 DIAGNOSIS — Z955 Presence of coronary angioplasty implant and graft: Secondary | ICD-10-CM | POA: Insufficient documentation

## 2021-12-21 NOTE — Progress Notes (Signed)
Daily Session Note  Patient Details  Name: Anita Michael MRN: [id number] Date of Birth: 03-24-1966 Referring Provider:   Flowsheet Row CARDIAC REHAB PHASE II ORIENTATION from 09/27/2021 in [place]  Referring Provider Dr. Audie Box       Encounter Date: 12/21/2021  Check In:  Session Check In - 12/21/21 1100       Check-In   Supervising physician immediately available to respond to emergencies [facility] MD immediately available    Physician(s) Dr Gardiner Rhyme    Location [facility]    Staff Present Aundra Dubin, RN, Joanette Gula, RN, Bjorn Loser, MS, ACSM-CEP, Exercise Physiologist    Virtual Visit No    Medication changes reported     No    Fall or balance concerns reported    No    Tobacco Cessation No Change    Warm-up and Cool-down Performed as group-led instruction    Resistance Training Performed Yes    VAD Patient? No    PAD/SET Patient? No      Pain Assessment   Currently in Pain? No/denies    Multiple Pain Sites No             Capillary Blood Glucose: No results found for this or any previous visit (from the past 24 hour(s)).    Social History   Tobacco Use  Smoking Status Some Days   Packs/day: 0.50   Types: Cigarettes   Last attempt to quit: 02/07/2019   Years since quitting: 2.8  Smokeless Tobacco Never    Goals Met:  Independence with exercise equipment Exercise tolerated well No report of concerns or symptoms today Strength training completed today  Goals Unmet:  Not Applicable  Comments: checkout at 1200.   Dr. Carlyle Dolly is Medical Director for [facility] Card[facility]

## 2021-12-21 NOTE — Progress Notes (Deleted)
NO SHOW

## 2021-12-24 ENCOUNTER — Encounter (HOSPITAL_COMMUNITY): Payer: Medicaid Other | Admitting: Hematology

## 2021-12-24 ENCOUNTER — Inpatient Hospital Stay (HOSPITAL_COMMUNITY): Payer: Medicaid Other | Admitting: Hematology

## 2021-12-25 NOTE — Progress Notes (Signed)
Discharge Progress Report  Patient Details  Name: Anita Michael MRN: [id number] Date of Birth: 04-Jun-1966 Referring Provider:   Flowsheet Row CARDIAC REHAB PHASE II ORIENTATION from 09/27/2021 in [place]  Referring Provider Dr. Audie Box        Number of Visits: 21  Reason for Discharge:  Patient reached a stable level of exercise. Patient independent in their exercise. Patient has met program and personal goals.  Smoking History:  Social History   Tobacco Use  Smoking Status Some Days   Packs/day: 0.50   Types: Cigarettes   Last attempt to quit: 02/07/2019   Years since quitting: 2.8  Smokeless Tobacco Never    Diagnosis:  NSTEMI (non-ST elevated myocardial infarction) ([place])  Status post coronary artery stent placement  ADL [facility]:   Initial Exercise Prescription:  Initial Exercise Prescription - 09/27/21 1400       Date of Initial Exercise RX and Referring Provider   Date 10/04/21    Referring Provider Dr. Audie Box    Expected Discharge Date 12/21/21      NuStep   Level 1    SPM 60    Minutes 22      Arm Ergometer   Level 1    RPM 60    Minutes 17      Prescription Details   Frequency (times per week) 3    Duration Progress to 30 minutes of continuous aerobic without signs/symptoms of physical distress      Intensity   THRR 40-80% of Max Heartrate 65-131    Ratings of Perceived Exertion 11-13    Perceived Dyspnea 0-4      Resistance Training   Training Prescription Yes    Weight 2    Reps 10-15             Discharge Exercise Prescription (Final Exercise Prescription Changes):  Exercise Prescription Changes - 12/07/21 1200       Response to Exercise   Blood Pressure (Admit) 130/72    Blood Pressure (Exercise) 128/68    Blood Pressure (Exit) 112/68    Heart Rate (Admit) 78 bpm    Heart Rate (Exercise) 95 bpm    Heart Rate (Exit) 77 bpm    Rating of Perceived Exertion (Exercise) 13    Duration Continue with 30  min of aerobic exercise without signs/symptoms of physical distress.    Intensity THRR unchanged      Progression   Progression Continue to progress workloads to maintain intensity without signs/symptoms of physical distress.      Resistance Training   Training Prescription Yes    Weight 3    Reps 10-15    Time 10 Minutes      NuStep   Level 1    SPM 66    Minutes 22    METs 1.79      Arm Ergometer   Level 2    RPM 31    Minutes 17    METs 1.5             Functional Capacity:  6 Minute Walk     Row Name 09/27/21 1418 12/21/21 1129       6 Minute Walk   Phase Initial Discharge    Distance 700 feet 800 feet    Distance Feet Change -- 100 ft    Walk Time 6 minutes 6 minutes    # of Rest Breaks 1 1    MPH 1.32 1.51    METS 2.26  2.74    RPE 13 12    VO2 Peak 7.9 9.61    Symptoms Yes (comment) Yes (comment)    Comments one standing rest break for 30 seconds due to being tired One standing break for 5 seconds due to being tired and BLE pain (5/10)    Resting HR 80 bpm 95 bpm    Resting BP 132/84 124/76    Resting Oxygen Saturation  95 % 100 %    Exercise Oxygen Saturation  during 6 min walk 98 % 95 %    Max Ex. HR 87 bpm 112 bpm    Max Ex. BP 160/80 130/78    2 Minute Post BP 138/80 120/78             Psychological, QOL, Others - Outcomes: PHQ 2/9:    09/27/2021    1:30 PM 07/27/2021   10:29 AM  Depression screen PHQ 2/9  Decreased Interest 0 0  Down, Depressed, Hopeless 0 0  PHQ - 2 Score 0 0  Altered sleeping 0 0  Tired, decreased energy 3 3  Change in appetite 0 1  Feeling bad or failure about yourself  0 0  Trouble concentrating 0 0  Moving slowly or fidgety/restless 0 0  Suicidal thoughts 0 0  PHQ-9 Score 3 4  Difficult doing work/chores Very difficult     Quality of Life:  Quality of Life - 09/27/21 1423       Quality of Life   Select Quality of Life      Quality of Life Scores   Health/Function Pre 19.28 %    Socioeconomic Pre  28.5 %    Psych/Spiritual Pre 22.8 %    Family Pre 27 %    GLOBAL Pre 22.78 %             Personal Goals: Goals established at orientation with interventions provided to work toward goal.  Personal Goals and Risk Factors at Admission - 09/27/21 1343       Core Components/Risk Factors/Patient Goals on Admission    Weight Management Yes;Weight Loss;Weight Maintenance    Intervention Weight Management: Develop a combined nutrition and exercise program designed to reach desired caloric intake, while maintaining appropriate intake of nutrient and fiber, sodium and fats, and appropriate energy expenditure required for the weight goal.;Weight Management: Provide education and appropriate resources to help participant work on and attain dietary goals.;Weight Management/Obesity: Establish reasonable short term and long term weight goals.;Obesity: Provide education and appropriate resources to help participant work on and attain dietary goals.    Expected Outcomes Short Term: Continue to assess and modify interventions until short term weight is achieved;Long Term: Adherence to nutrition and physical activity/exercise program aimed toward attainment of established weight goal;Weight Maintenance: Understanding of the daily nutrition guidelines, which includes 25-35% calories from fat, 7% or less cal from saturated fats, less than 220m cholesterol, less than 1.5gm of sodium, & 5 or more servings of fruits and vegetables daily;Understanding recommendations for meals to include 15-35% energy as protein, 25-35% energy from fat, 35-60% energy from carbohydrates, less than 2040mof dietary cholesterol, 20-35 gm of total fiber daily;Weight Loss: Understanding of general recommendations for a balanced deficit meal plan, which promotes 1-2 lb weight loss per week and includes a negative energy balance of [phone] kcal/d;Understanding of distribution of calorie intake throughout the day with the consumption of 4-5  meals/snacks    Tobacco Cessation Yes    Number of packs per day 1 pack per  month    Intervention Assist the participant in steps to quit. Provide individualized education and counseling about committing to Tobacco Cessation, relapse prevention, and pharmacological support that can be provided by physician.;Advice worker, assist with locating and accessing local/national Quit Smoking programs, and support quit date choice.    Expected Outcomes Short Term: Will demonstrate readiness to quit, by selecting a quit date.;Short Term: Will quit all tobacco product use, adhering to prevention of relapse plan.;Long Term: Complete abstinence from all tobacco products for at least 12 months from quit date.    Improve shortness of breath with ADL's Yes    Intervention Provide education, individualized exercise plan and daily activity instruction to help decrease symptoms of SOB with activities of daily living.    Expected Outcomes Short Term: Improve cardiorespiratory fitness to achieve a reduction of symptoms when performing ADLs;Long Term: Be able to perform more ADLs without symptoms or delay the onset of symptoms    Personal Goal Other Yes    Personal Goal Return to work    Intervention Attend cardiac rehab and begin a home exercise program.    Expected Outcomes Her energy levels will improve to the point where she can return to work.              Personal Goals Discharge:  Goals and Risk Factor Review     Row Name 10/08/21 1313 11/05/21 1055 12/03/21 1233 12/25/21 1545       Core Components/Risk Factors/Patient Goals Review   Personal Goals Review Weight Management/Obesity;Tobacco Cessation;Improve shortness of breath with ADL's;Lipids;Hypertension Weight Management/Obesity;Tobacco Cessation;Improve shortness of breath with ADL's;Lipids;Hypertension Weight Management/Obesity;Tobacco Cessation;Improve shortness of breath with ADL's;Lipids;Hypertension Weight  Management/Obesity;Tobacco Cessation;Improve shortness of breath with ADL's;Lipids;Hypertension    Review Patient was referred to CR wtih NSTEMI/DES. She has multiple risk factors for CAD and is participating in the program for risk modification. She has completed 3 sessions and her current weight is 196.6 lbs up 2 lbs from her initial weight. Her personal goals for the program are to lose weight; decrease her SOB with exertion and gain enough energy to be able to go back to work. We will continue to monitor her progress as she works towards meeting these goals. Patient has completed 9 sessions and her current weight is 191.0 lbs down 5.6 lbs since last 30 day review. She is doing well in the program with consistent attendance and progressions. Her blood pressure is at goal. She had an ECHO 3/27 which showed normal heart function with mild leakage in MV. Her personal goals for the program continue to be to lose weight; decrease her SOB with exertion and gain enough energy to be able to go back to work. We will continue to monitor her progress as she works towards meeting these goals. Patient has completed 14 sessions and her current weight is 189.7 lbs down 3 lbs since last 30 day review. She continues to do well in the program with progressions. Her attendance is inconsistent. She is frequently absent and does not call which impedes her progres in the program. Her blood pressure is at goal. She  Her personal goals for the program continue to be to lose weight; decrease her SOB with exertion and gain enough energy to be able to go back to work. We will continue to monitor her progress as she works towards meeting these goals. Pt graduated from CR after 21 sessions. She lost 7.6 lbs while in the program. She was able to progress some in the  program, but her progressions were limited due to her inconsistent attendance, fibromyalgia, and lack of energy. Her vitals were WNLs while she was in the program. 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    Expected Outcomes Patient will complete the program meeting both personal and program goals. Patient will complete the program meeting both personal and program goals. Patient will complete the program meeting both personal and program goals. Pt will continue to work towards their goals post discharge.             Exercise Goals and Review:  Exercise Goals     Row Name 09/27/21 1422 10/15/21 1249 11/05/21 1150 12/11/21 0900       Exercise Goals   Increase Physical Activity Yes Yes Yes Yes    Intervention Provide advice, education, support and counseling about physical activity/exercise needs.;Develop an individualized exercise prescription for aerobic and resistive training based on initial evaluation findings, risk stratification, comorbidities and participant's personal goals. Provide advice, education, support and counseling about physical activity/exercise needs.;Develop an individualized exercise prescription for aerobic and resistive training based on initial evaluation findings, risk stratification, comorbidities and participant's personal goals. Provide advice, education, support and counseling about physical activity/exercise needs.;Develop an individualized exercise prescription for aerobic and resistive training based on initial evaluation findings, risk stratification, comorbidities and participant's personal goals. Provide advice, education, support and counseling about physical activity/exercise needs.;Develop an individualized exercise prescription for aerobic and resistive training based on initial evaluation findings, risk stratification, comorbidities and participant's personal goals.    Expected Outcomes Short Term: Attend rehab on a regular basis to increase amount of physical activity.;Long Term: Add in home exercise to make exercise part of routine and to increase  amount of physical activity.;Long Term: Exercising regularly at least 3-5 days a week. Short Term: Attend rehab on a regular basis to increase amount of physical activity.;Long Term: Add in home exercise to make exercise part of routine and to increase amount of physical activity.;Long Term: Exercising regularly at least 3-5 days a week. Short Term: Attend rehab on a regular basis to increase amount of physical activity.;Long Term: Add in home exercise to make exercise part of routine and to increase amount of physical activity.;Long Term: Exercising regularly at least 3-5 days a week. Short Term: Attend rehab on a regular basis to increase amount of physical activity.;Long Term: Add in home exercise to make exercise part of routine and to increase amount of physical activity.;Long Term: Exercising regularly at least 3-5 days a week.    Increase Strength and Stamina Yes Yes Yes Yes    Intervention Provide advice, education, support and counseling about physical activity/exercise needs.;Develop an individualized exercise prescription for aerobic and resistive training based on initial evaluation findings, risk stratification, comorbidities and participant's personal goals. Provide advice, education, support and counseling about physical activity/exercise needs.;Develop an individualized exercise prescription for aerobic and resistive training based on initial evaluation findings, risk stratification, comorbidities and participant's personal goals. Provide advice, education, support and counseling about physical activity/exercise needs.;Develop an individualized exercise prescription for aerobic and resistive training based on initial evaluation findings, risk stratification, comorbidities and participant's personal goals. Provide advice, education, support and counseling about physical activity/exercise needs.;Develop an individualized exercise prescription for aerobic and resistive training based on initial  evaluation findings, risk stratification, comorbidities and participant's personal goals.    Expected Outcomes Short Term: Increase workloads from initial exercise prescription for  resistance, speed, and METs.;Short Term: Perform resistance training exercises routinely during rehab and add in resistance training at home;Long Term: Improve cardiorespiratory fitness, muscular endurance and strength as measured by increased METs and functional capacity (6MWT) Short Term: Increase workloads from initial exercise prescription for resistance, speed, and METs.;Short Term: Perform resistance training exercises routinely during rehab and add in resistance training at home;Long Term: Improve cardiorespiratory fitness, muscular endurance and strength as measured by increased METs and functional capacity (6MWT) Short Term: Increase workloads from initial exercise prescription for resistance, speed, and METs.;Short Term: Perform resistance training exercises routinely during rehab and add in resistance training at home;Long Term: Improve cardiorespiratory fitness, muscular endurance and strength as measured by increased METs and functional capacity (6MWT) Short Term: Increase workloads from initial exercise prescription for resistance, speed, and METs.;Short Term: Perform resistance training exercises routinely during rehab and add in resistance training at home;Long Term: Improve cardiorespiratory fitness, muscular endurance and strength as measured by increased METs and functional capacity (6MWT)    Able to understand and use rate of perceived exertion (RPE) scale Yes Yes Yes Yes    Intervention Provide education and explanation on how to use RPE scale Provide education and explanation on how to use RPE scale Provide education and explanation on how to use RPE scale Provide education and explanation on how to use RPE scale    Expected Outcomes Short Term: Able to use RPE daily in rehab to express subjective intensity  level;Long Term:  Able to use RPE to guide intensity level when exercising independently Short Term: Able to use RPE daily in rehab to express subjective intensity level;Long Term:  Able to use RPE to guide intensity level when exercising independently Short Term: Able to use RPE daily in rehab to express subjective intensity level;Long Term:  Able to use RPE to guide intensity level when exercising independently Short Term: Able to use RPE daily in rehab to express subjective intensity level;Long Term:  Able to use RPE to guide intensity level when exercising independently    Knowledge and understanding of Target Heart Rate Range (THRR) Yes Yes Yes Yes    Intervention Provide education and explanation of THRR including how the numbers were predicted and where they are located for reference Provide education and explanation of THRR including how the numbers were predicted and where they are located for reference Provide education and explanation of THRR including how the numbers were predicted and where they are located for reference Provide education and explanation of THRR including how the numbers were predicted and where they are located for reference    Expected Outcomes Short Term: Able to state/look up THRR;Short Term: Able to use daily as guideline for intensity in rehab;Long Term: Able to use THRR to govern intensity when exercising independently Short Term: Able to state/look up THRR;Short Term: Able to use daily as guideline for intensity in rehab;Long Term: Able to use THRR to govern intensity when exercising independently Short Term: Able to state/look up THRR;Short Term: Able to use daily as guideline for intensity in rehab;Long Term: Able to use THRR to govern intensity when exercising independently Short Term: Able to state/look up THRR;Short Term: Able to use daily as guideline for intensity in rehab;Long Term: Able to use THRR to govern intensity when exercising independently    Able to check  pulse independently Yes Yes Yes Yes    Intervention Provide education and demonstration on how to check pulse in carotid and radial arteries.;Review the importance of being able  to check your own pulse for safety during independent exercise Provide education and demonstration on how to check pulse in carotid and radial arteries.;Review the importance of being able to check your own pulse for safety during independent exercise Provide education and demonstration on how to check pulse in carotid and radial arteries.;Review the importance of being able to check your own pulse for safety during independent exercise Provide education and demonstration on how to check pulse in carotid and radial arteries.;Review the importance of being able to check your own pulse for safety during independent exercise    Expected Outcomes Short Term: Able to explain why pulse checking is important during independent exercise;Long Term: Able to check pulse independently and accurately Short Term: Able to explain why pulse checking is important during independent exercise;Long Term: Able to check pulse independently and accurately Short Term: Able to explain why pulse checking is important during independent exercise;Long Term: Able to check pulse independently and accurately Short Term: Able to explain why pulse checking is important during independent exercise;Long Term: Able to check pulse independently and accurately    Understanding of Exercise Prescription Yes Yes Yes Yes    Intervention Provide education, explanation, and written materials on patient's individual exercise prescription Provide education, explanation, and written materials on patient's individual exercise prescription Provide education, explanation, and written materials on patient's individual exercise prescription Provide education, explanation, and written materials on patient's individual exercise prescription    Expected Outcomes Short Term: Able to explain  program exercise prescription;Long Term: Able to explain home exercise prescription to exercise independently Short Term: Able to explain program exercise prescription;Long Term: Able to explain home exercise prescription to exercise independently Short Term: Able to explain program exercise prescription;Long Term: Able to explain home exercise prescription to exercise independently Short Term: Able to explain program exercise prescription;Long Term: Able to explain home exercise prescription to exercise independently             Exercise Goals Re-Evaluation:  Exercise Goals Re-Evaluation     Row Name 10/15/21 1250 11/05/21 1150 12/11/21 0900         Exercise Goal Re-Evaluation   Exercise Goals Review Increase Physical Activity;Increase Strength and Stamina;Able to understand and use rate of perceived exertion (RPE) scale;Knowledge and understanding of Target Heart Rate Range (THRR);Able to check pulse independently;Understanding of Exercise Prescription Increase Physical Activity;Increase Strength and Stamina;Able to understand and use rate of perceived exertion (RPE) scale;Knowledge and understanding of Target Heart Rate Range (THRR);Able to check pulse independently;Understanding of Exercise Prescription Increase Strength and Stamina;Increase Physical Activity;Able to understand and use rate of perceived exertion (RPE) scale;Knowledge and understanding of Target Heart Rate Range (THRR);Able to check pulse independently;Understanding of Exercise Prescription     Comments Pt has completed 5 sessions of cardiac rehab. She is deconditioned and progression will take time. She is currently exercising at 1.76 METs on the stepper. Will continue to monitor and progress as able. Pt has completed 10 sessions of cardiac rehab. Her progress of workload has increased slighly due to her being deconditioned. She is currently exercising at 1.72 METs on the stepper. Will continue to monitor and progress as able.  Pt has completed 17 sessions of cardiac rehab. She has progressed her workload byone. She continues to be deconditioned and progressing at a slow rate. She is not motivated when she is in class. She is currently exercising at 1.79 METs on the stepper. Will coninue to monitor and progress as able.     Expected Outcomes Through  exercise at home and at rehab, the patient will meet their stated goals. Through exercise at home and at rehab, the patient will meet their stated goals. Through exercise at home and at rehab, the patient will meet their stated goals.              Nutrition & Weight - Outcomes:  Pre Biometrics - 09/27/21 1422       Pre Biometrics   Height 5' 7.5" (1.715 m)    Weight 191 lb 12.8 oz (87 kg)    Waist Circumference 48 inches    Hip Circumference 40 inches    Waist to Hip Ratio 1.2 %    BMI (Calculated) 29.58    Triceps Skinfold 18 mm    % Body Fat 41 %    Grip Strength 14.3 kg    Flexibility 0 in    Single Leg Stand 0 seconds             Post Biometrics - 12/21/21 1133        Post  Biometrics   Height 5' 7.5" (1.715 m)    Weight 184 lb 1.4 oz (83.5 kg)    Waist Circumference 43 inches    Hip Circumference 41 inches    Waist to Hip Ratio 1.05 %    BMI (Calculated) 28.39    Triceps Skinfold 12 mm    % Body Fat 36.6 %    Grip Strength 5 kg    Flexibility 0 in    Single Leg Stand 0 seconds             Nutrition:  Nutrition Therapy & Goals - 09/27/21 1336       Intervention Plan   Intervention Nutrition handout(s) given to patient.    Expected Outcomes Short Term Goal: Understand basic principles of dietary content, such as calories, fat, sodium, cholesterol and nutrients.             Nutrition Discharge:  Nutrition Assessments - 09/27/21 1337       MEDFICTS Scores   Pre Score 18             Education Questionnaire Score:  Knowledge Questionnaire Score - 09/27/21 1338       Knowledge Questionnaire Score   Pre Score  14/24             Goals reviewed with patient; copy given to patient. Pt graduated from CR after 21 sessions. She was able to improve her walk test distance by 14.3%, and her MET level at graduation was 1.83. She reports no specific plans to continue exercising post discharge.

## 2022-01-01 ENCOUNTER — Telehealth: Payer: Self-pay

## 2022-01-01 NOTE — Telephone Encounter (Signed)
   Pre-operative Risk Assessment    Patient Name: Anita Michael  DOB: March 19, 1966 MRN: [id number]  Request for Surgical Clearance    Procedure:   Cervical Fusion  Date of Surgery:  Clearance TBD                                 Surgeon:  Dr. Duffy Rhody Surgeon's Group or Practice Name:  [facility] Phone number:  [phone]  Fax number:  [phone]   Type of Clearance Requested:   - Medical    Type of Anesthesia:  General    Additional requests/questions:  Please advise surgeon/provider what medications should be held.  Signed, Sung Amabile   01/01/2022, 4:21 PM

## 2022-01-02 NOTE — Telephone Encounter (Signed)
Per pre op provider notes have been faxed to requesting office. Please see notes from Christen Bame, NP pre op provider today. Pt has appt 02/13/22 with Dr. Domenic Polite.

## 2022-01-02 NOTE — Telephone Encounter (Signed)
Primary Cardiologist:Anita Domenic Polite, MD  Chart reviewed as part of pre-operative protocol coverage. Because of Anita Michael past medical history and coronary intervention on 07/06/2021, recommendation is to continue DAPT with aspirin and Brilinta for 12 months without interruption. If procedure is of an urgent nature, we will need to discuss with primary cardiologist, Dr. Domenic Michael when he returns to the office June 26.   Preop covering staff: please notify patient and requesting office of the information above. I will also route a copy to requesting surgeon's office.   Emmaline Life, NP-C    01/02/2022, 1:45 PM [facility] [address] Office [phone] Fax [phone]

## 2022-02-13 ENCOUNTER — Encounter: Payer: Self-pay | Admitting: Cardiology

## 2022-02-13 ENCOUNTER — Ambulatory Visit: Payer: Medicaid Other | Admitting: Cardiology

## 2022-02-13 VITALS — BP 106/70 | HR 92 | Ht 67.5 in | Wt 184.8 lb

## 2022-02-13 DIAGNOSIS — I25119 Atherosclerotic heart disease of native coronary artery with unspecified angina pectoris: Secondary | ICD-10-CM | POA: Diagnosis not present

## 2022-02-13 DIAGNOSIS — Z01810 Encounter for preprocedural cardiovascular examination: Secondary | ICD-10-CM | POA: Diagnosis not present

## 2022-02-13 DIAGNOSIS — I493 Ventricular premature depolarization: Secondary | ICD-10-CM | POA: Diagnosis not present

## 2022-02-13 NOTE — Progress Notes (Signed)
Cardiology Office Note  Date: 02/13/2022   ID: Anita Michael, DOB [birth year], MRN [id number]  PCP:  Gwenlyn Saran, [place]  Cardiologist:  Rozann Lesches, MD Electrophysiologist:  None   Chief Complaint  Patient presents with   Cardiac follow-up    History of Present Illness: Anita Michael is a 56 y.o. female last seen in February by Ms. Vita Barley, I reviewed the note.  She is here for a follow-up visit.  Reports no definite angina symptoms or nitroglycerin use.  She has been having some neuropathic pain in her arm at nighttime.  She is being considered for cervical spine fusion under general anesthesia with Dr. Marcello Moores based on record review.  We discussed this today. She is status post NSTEMI back in December 2022 at which point she underwent placement of DES to the mid LAD with recommendation to continue uninterrupted dual antiplatelet therapy for 12 months.  She did complete cardiac rehabilitation and has been exercising at the [facility] since then at least once a week.  I reviewed her medications which are stable from a cardiac perspective.  She has tolerated Lipitor with LDL 65 in May.  I went over her echocardiogram from March at which point LVEF was 50 to 55% and she had mild mitral regurgitation.  Past Medical History:  Diagnosis Date   CAD (coronary artery disease)    a. s/p NSTEMI in 06/2021 with DES to mid-RCA. Residual disease along D1 and 1st Mrg with medical management recommended.   Essential hypertension    Fibromyalgia    Generalized headaches    History of stroke    Noted incidentally by brain MRI July 2022   Mitral regurgitation    a. moderate to severe by echo in 06/2021   Polycythemia    PVC's (premature ventricular contractions)     Past Surgical History:  Procedure Laterality Date   APPENDECTOMY     BREAST BIOPSY Right 2015   Fibroadenoma   CHOLECYSTECTOMY     CORONARY STENT INTERVENTION N/A 07/06/2021   Procedure: CORONARY  STENT INTERVENTION;  Surgeon: Sherren Mocha, MD;  Location: [place] [facility];  Service: Cardiovascular;  Laterality: N/A;   LEFT HEART CATH AND CORONARY ANGIOGRAPHY N/A 07/06/2021   Procedure: LEFT HEART CATH AND CORONARY ANGIOGRAPHY;  Surgeon: Sherren Mocha, MD;  Location: [place] [facility];  Service: Cardiovascular;  Laterality: N/A;   TONSILLECTOMY      Current Outpatient Medications  Medication Sig Dispense Refill   acetaminophen (TYLENOL) 500 MG tablet Take 1,000 mg by mouth every 6 (six) hours as needed (for pain.).     AIMOVIG 140 MG/ML SOAJ Inject 140 mg into the skin every 30 (thirty) days.     amitriptyline (ELAVIL) 50 MG tablet Take 50 mg by mouth at bedtime.     aspirin EC 81 MG EC tablet Take 1 tablet (81 mg total) by mouth daily. Swallow whole. 30 tablet 11   atorvastatin (LIPITOR) 80 MG tablet Take 1 tablet (80 mg total) by mouth daily at 6 PM. 90 tablet 1   cetirizine (ZYRTEC) 10 MG tablet Take 10 mg by mouth daily as needed for allergies.     cholecalciferol (VITAMIN D3) 25 MCG (1000 UT) tablet Take 1,000 Units by mouth daily in the afternoon.     cyclobenzaprine (FLEXERIL) 10 MG tablet Take 10 mg by mouth 2 (two) times daily as needed for muscle spasms.     fluticasone (FLONASE) 50 MCG/ACT nasal spray Place 2 sprays into  both nostrils daily. (Patient taking differently: Place 2 sprays into both nostrils daily as needed for allergies.) 16 g 0   isosorbide mononitrate (IMDUR) 30 MG 24 hr tablet Take 1 tablet (30 mg total) by mouth daily. (Patient taking differently: Take 30 mg by mouth daily in the afternoon.) 90 tablet 3   losartan (COZAAR) 100 MG tablet TAKE ONE TABLET BY MOUTH ONCE DAILY. 90 tablet 2   nitroGLYCERIN (NITROSTAT) 0.4 MG SL tablet Place 1 tablet (0.4 mg total) under the tongue every 5 (five) minutes x 3 doses as needed for chest pain. 25 tablet 2   nystatin (MYCOSTATIN/NYSTOP) powder 1 application. 2 (two) times daily as needed (skin irritation).      nystatin ointment (MYCOSTATIN) Apply 1 application. topically once a week.     PREMARIN vaginal cream Place 1 application. vaginally once a week.     ticagrelor (BRILINTA) 90 MG TABS tablet Take 1 tablet (90 mg total) by mouth 2 (two) times daily. 180 tablet 3   No current facility-administered medications for this visit.   Allergies:  Nifedipine and Latex   ROS: No palpitations or syncope.  Physical Exam: VS:  BP 106/70   Pulse 92   Ht 5' 7.5" (1.715 m)   Wt 184 lb 12.8 oz (83.8 kg)   SpO2 96%   BMI 28.52 kg/m , BMI Body mass index is 28.52 kg/m.  Wt Readings from Last 3 Encounters:  02/13/22 184 lb 12.8 oz (83.8 kg)  12/21/21 184 lb 1.4 oz (83.5 kg)  11/26/21 189 lb 9.5 oz (86 kg)    General: Patient appears comfortable at rest. HEENT: Conjunctiva and lids normal, oropharynx clear. Neck: Supple, no elevated JVP or carotid bruits, no thyromegaly. Lungs: Clear to auscultation, nonlabored breathing at rest. Cardiac: Regular rate and rhythm, no S3, 2/6 systolic murmur, no pericardial rub. Abdomen: Soft, nontender, bowel sounds present. Extremities: No pitting edema.  ECG:  An ECG dated 07/06/2021 was personally reviewed today and demonstrated:  Sinus rhythm with right bundle branch block, left posterior fascicular block, and PVCs (upright in the inferior leads).  Recent Labwork: 07/07/2021: BUN 5; Creatinine, Ser 0.94; Hemoglobin 13.6; Platelets 206; Potassium 4.5; Sodium 140     Component Value Date/Time   CHOL 131 11/26/2021 1211   TRIG 150 (H) 11/26/2021 1211   HDL 36 (L) 11/26/2021 1211   CHOLHDL 3.6 11/26/2021 1211   VLDL 30 11/26/2021 1211   LDLCALC 65 11/26/2021 1211    Other Studies Reviewed Today:  Cardiac catheterization 07/06/2021:   1st Diag lesion is 50% stenosed.   1st Mrg lesion is 50% stenosed.   Mid RCA lesion is 99% stenosed.   A drug-eluting stent was successfully placed using a STENT ONYX FRONTIER 3.5X18.   Post intervention, there is a 0%  residual stenosis.   1.  Severe single-vessel coronary artery disease with critical stenosis of the mid RCA, treated successfully with PCI using a 3.5 x 18 mm resolute Onyx DES 2.  Mild nonobstructive diagonal and obtuse marginal stenoses 3.  Mildly elevated LVEDP   Recommend: Continue cangrelor x2 hours then stop, check 2D echocardiogram, post MI medical therapy.  Dual antiplatelet therapy with aspirin and ticagrelor x12 months without interruption.  Medical therapy for mild residual coronary artery disease.   Echocardiogram 10/15/2021:  1. Left ventricular ejection fraction, by estimation, is 50 to 55%. The  left ventricle has low normal function. The left ventricle demonstrates  regional wall motion abnormalities (see scoring diagram/findings for  description).  Left ventricular diastolic   parameters are consistent with Grade I diastolic dysfunction (impaired  relaxation).   2. Right ventricular systolic function is normal. The right ventricular  size is normal.   3. The mitral valve is normal in structure. Mild mitral valve  regurgitation. No evidence of mitral stenosis.   4. The aortic valve is normal in structure. Aortic valve regurgitation is  not visualized. No aortic stenosis is present.   5. The inferior vena cava is normal in size with greater than 50%  respiratory variability, suggesting right atrial pressure of 3 mmHg.   Assessment and Plan:  1.  Preoperative cardiac evaluation in a 56 year old woman with a history of NSTEMI in December 2022 status post DES to the mid RCA at that time and with recommendation to continue uninterrupted dual antiplatelet therapy for 1 year.  She is being considered for cervical spine fusion under general anesthesia.  I have recommended holding off on this elective operation until she completes DAPT in December of this year.  We will plan to bring her back to the office around November to see if any additional testing is necessary.  2.  CAD status  post NSTEMI in December 2022 managed with DES to the mid RCA.  She does not report any progressive angina on medical therapy.  Continue aspirin, Brilinta, Lipitor, Imdur, and Cozaar.  3.  Intermittent PVCs, inferior axis and most likely benign, likely of outflow tract origin.  LVEF low normal range at 50 to 55% by echocardiogram in March.  Medication Adjustments/Labs and Tests Ordered: Current medicines are reviewed at length with the patient today.  Concerns regarding medicines are outlined above.   Tests Ordered: No orders of the defined types were placed in this encounter.   Medication Changes: No orders of the defined types were placed in this encounter.   Disposition:  Follow up  November.  Signed, Satira Sark, MD, [facility] 02/13/2022 4:18 PM    [facility] [address] [place], [place] [address] Phone: [phone]; Fax: [phone]

## 2022-02-13 NOTE — Patient Instructions (Signed)
Medication Instructions:  Your physician recommends that you continue on your current medications as directed. Please refer to the Current Medication list given to you today.   Labwork: None today  Testing/Procedures: None today  Follow-Up: November  Any Other Special Instructions Will Be Listed Below (If Applicable).  If you need a refill on your cardiac medications before your next appointment, please call your pharmacy.

## 2022-02-28 ENCOUNTER — Other Ambulatory Visit: Payer: Self-pay | Admitting: Student

## 2022-04-10 ENCOUNTER — Ambulatory Visit (HOSPITAL_COMMUNITY)
Admission: RE | Admit: 2022-04-10 | Discharge: 2022-04-10 | Disposition: A | Discharge status: Home / Self Care | Payer: Medicaid Other | Source: Ambulatory Visit | Attending: Urology | Admitting: Urology

## 2022-04-10 ENCOUNTER — Ambulatory Visit: Payer: Medicaid Other | Admitting: Urology

## 2022-04-10 DIAGNOSIS — N281 Cyst of kidney, acquired: Secondary | ICD-10-CM | POA: Insufficient documentation

## 2022-04-16 ENCOUNTER — Ambulatory Visit: Payer: Medicaid Other | Admitting: Urology

## 2022-04-19 ENCOUNTER — Ambulatory Visit: Payer: Medicaid Other | Admitting: Urology

## 2022-04-19 ENCOUNTER — Encounter: Payer: Self-pay | Admitting: Urology

## 2022-04-19 VITALS — BP 126/76 | HR 65

## 2022-04-19 DIAGNOSIS — N3281 Overactive bladder: Secondary | ICD-10-CM | POA: Diagnosis not present

## 2022-04-19 DIAGNOSIS — N281 Cyst of kidney, acquired: Secondary | ICD-10-CM | POA: Diagnosis not present

## 2022-04-19 MED ORDER — GEMTESA 75 MG PO TABS: 1.0000 | ORAL_TABLET | Freq: Every day | ORAL | 0 refills | Status: DC

## 2022-04-19 NOTE — Patient Instructions (Signed)

## 2022-04-19 NOTE — Progress Notes (Signed)
04/19/2022 11:23 AM   Anita Michael 07-11-1966 [id number]  Referring provider: Dara Lords, NP No address on file  Followup right renal cyst   HPI: Anita Michael is a 56yo here for followup for a right renal cyst. Renal US 9/20 shows a stable 4.9cm right renal cyst. Since last vist she had an MI and CVA. She is currently on Brilinta. NO hematuria. She has intermittent right flank pain that is dull, mild. She has nocturia 3-4x for the past 2-3 months. She has urinary frequency every 4-5 hours during the day.    PMH: Past Medical History:  Diagnosis Date   CAD (coronary artery disease)    a. s/p NSTEMI in 06/2021 with DES to mid-RCA. Residual disease along D1 and 1st Mrg with medical management recommended.   Essential hypertension    Fibromyalgia    Generalized headaches    History of stroke    Noted incidentally by brain MRI July 2022   Mitral regurgitation    a. moderate to severe by echo in 06/2021   Polycythemia    PVC's (premature ventricular contractions)     Surgical History: Past Surgical History:  Procedure Laterality Date   APPENDECTOMY     BREAST BIOPSY Right 2015   Fibroadenoma   CHOLECYSTECTOMY     CORONARY STENT INTERVENTION N/A 07/06/2021   Procedure: CORONARY STENT INTERVENTION;  Surgeon: Sherren Mocha, MD;  Location: [place] [facility];  Service: Cardiovascular;  Laterality: N/A;   LEFT HEART CATH AND CORONARY ANGIOGRAPHY N/A 07/06/2021   Procedure: LEFT HEART CATH AND CORONARY ANGIOGRAPHY;  Surgeon: Sherren Mocha, MD;  Location: [place] [facility];  Service: Cardiovascular;  Laterality: N/A;   TONSILLECTOMY      Home Medications:  Allergies as of 04/19/2022       Reactions   Nifedipine Other (See Comments), Palpitations   Heart races   Latex Rash        Medication List        Accurate as of April 19, 2022 11:23 AM. If you have any questions, ask your nurse or doctor.          acetaminophen 500 MG tablet Commonly  known as: TYLENOL Take 1,000 mg by mouth every 6 (six) hours as needed (for pain.).   Aimovig 140 MG/ML Soaj Generic drug: Erenumab-aooe Inject 140 mg into the skin every 30 (thirty) days.   amitriptyline 50 MG tablet Commonly known as: ELAVIL Take 50 mg by mouth at bedtime.   aspirin EC 81 MG tablet Take 1 tablet (81 mg total) by mouth daily. Swallow whole.   atorvastatin 80 MG tablet Commonly known as: LIPITOR TAKE 1 TABLET BY MOUTH DAILY AT 6PM.   cetirizine 10 MG tablet Commonly known as: ZYRTEC Take 10 mg by mouth daily as needed for allergies.   cholecalciferol 25 MCG (1000 UNIT) tablet Commonly known as: VITAMIN D3 Take 1,000 Units by mouth daily in the afternoon.   cyclobenzaprine 10 MG tablet Commonly known as: FLEXERIL Take 10 mg by mouth 2 (two) times daily as needed for muscle spasms.   fluticasone 50 MCG/ACT nasal spray Commonly known as: FLONASE Place 2 sprays into both nostrils daily. What changed:  when to take this reasons to take this   isosorbide mononitrate 30 MG 24 hr tablet Commonly known as: IMDUR Take 1 tablet (30 mg total) by mouth daily. What changed: when to take this   losartan 100 MG tablet Commonly known as: COZAAR TAKE ONE TABLET BY  MOUTH ONCE DAILY.   nitroGLYCERIN 0.4 MG SL tablet Commonly known as: NITROSTAT Place 1 tablet (0.4 mg total) under the tongue every 5 (five) minutes x 3 doses as needed for chest pain.   nystatin ointment Commonly known as: MYCOSTATIN Apply 1 application. topically once a week.   nystatin powder Commonly known as: MYCOSTATIN/NYSTOP 1 application. 2 (two) times daily as needed (skin irritation).   Premarin vaginal cream Generic drug: conjugated estrogens Place 1 application. vaginally once a week.   ticagrelor 90 MG Tabs tablet Commonly known as: BRILINTA Take 1 tablet (90 mg total) by mouth 2 (two) times daily.        Allergies:  Allergies  Allergen Reactions   Nifedipine Other (See  Comments) and Palpitations    Heart races   Latex Rash    Family History: Family History  Problem Relation Age of Onset   Breast cancer Mother 71   Breast cancer Sister 79   Breast cancer Maternal Grandmother 66   Breast cancer Sister 46    Social History:  reports that she has been smoking cigarettes. She has been smoking an average of .5 packs per day. She has never used smokeless tobacco. She reports that she does not drink alcohol and does not use drugs.  ROS: All other review of systems were reviewed and are negative except what is noted above in HPI  Physical Exam: BP 126/76   Pulse 65   Constitutional:  Alert and oriented, No acute distress. HEENT: [place] AT, moist mucus membranes.  Trachea midline, no masses. Cardiovascular: No clubbing, cyanosis, or edema. Respiratory: Normal respiratory effort, no increased work of breathing. GI: Abdomen is soft, nontender, nondistended, no abdominal masses GU: No CVA tenderness.  Lymph: No cervical or inguinal lymphadenopathy. Skin: No rashes, bruises or suspicious lesions. Neurologic: Grossly intact, no focal deficits, moving all 4 extremities. Psychiatric: Normal mood and affect.  Laboratory Data: Lab Results  Component Value Date   WBC 8.8 07/07/2021   HGB 13.6 07/07/2021   HCT 45.3 07/07/2021   MCV 88.0 07/07/2021   PLT 206 07/07/2021    Lab Results  Component Value Date   CREATININE 0.94 07/07/2021    No results found for: "PSA"  No results found for: "TESTOSTERONE"  Lab Results  Component Value Date   HGBA1C 6.2 (H) 07/07/2021    Urinalysis    Component Value Date/Time   BILIRUBINUR Negative 10/29/2021 1336   PROTEINUR Negative 10/29/2021 1336   UROBILINOGEN 0.2 10/29/2021 1336   NITRITE Negative 10/29/2021 1336   LEUKOCYTESUR Trace (A) 10/29/2021 1336    No results found for: "LABMICR", "WBCUA", "RBCUA", "LABEPIT", "MUCUS", "BACTERIA"  Pertinent Imaging: Renal US 04/10/2022: Images reviewed and  discussed with the patient No results found for this or any previous visit.  No results found for this or any previous visit.  No results found for this or any previous visit.  No results found for this or any previous visit.  Results for orders placed during the hospital encounter of 04/10/22  Ultrasound renal complete  Narrative CLINICAL DATA:  Follow-up kidney cyst.  EXAM: RENAL / URINARY TRACT ULTRASOUND COMPLETE  COMPARISON:  Renal ultrasound March 09, 2021  FINDINGS: Right Kidney:  Renal measurements: 7.3 x 4.4 x 3.0 cm = volume: 51 mL. The right kidney is atrophic with increased cortical echogenicity and cortical thinning. There is a cyst off the upper pole of the right kidney measuring 4.9 x 4.0 x 4.9 cm today versus 4.7 x 3.7 x  4.0 cm previously. The cyst is simple in appearance. No follow-up imaging recommended for the cyst.  Left Kidney:  Renal measurements: 13.2 x 6.2 x 6.7 cm = volume: 283 mL. Echogenicity within normal limits. No mass or hydronephrosis visualized.  Bladder:  Appears normal for degree of bladder distention.  Other:  None.  IMPRESSION: 1. The right kidney is atrophic with cortical thinning and increased echogenicity. There is a simple cyst off the upper pole of the right kidney as above. No follow-up imaging recommended for the cyst. 2. The left kidney and bladder are normal.   Electronically Signed By: Dorise Bullion III M.D. On: 04/11/2022 11:31  No valid procedures specified. No results found for this or any previous visit.  No results found for this or any previous visit.   Assessment & Plan:    1. Kidney cysts RTC 1 year with renal US  2. Nocturia -we will trial gemtesa '75mg'$  daily   No follow-ups on file.  Nicolette Bang, MD  [facility] [place]

## 2022-05-13 ENCOUNTER — Encounter: Payer: Self-pay | Admitting: *Deleted

## 2022-05-20 ENCOUNTER — Telehealth: Payer: Self-pay

## 2022-05-20 ENCOUNTER — Ambulatory Visit (INDEPENDENT_AMBULATORY_CARE_PROVIDER_SITE_OTHER): Payer: Medicaid Other | Admitting: Physician Assistant

## 2022-05-20 ENCOUNTER — Encounter: Payer: Self-pay | Admitting: Physician Assistant

## 2022-05-20 VITALS — BP 119/80 | HR 84 | Wt 180.0 lb

## 2022-05-20 DIAGNOSIS — N3281 Overactive bladder: Secondary | ICD-10-CM

## 2022-05-20 DIAGNOSIS — N281 Cyst of kidney, acquired: Secondary | ICD-10-CM

## 2022-05-20 DIAGNOSIS — R8271 Bacteriuria: Secondary | ICD-10-CM | POA: Diagnosis not present

## 2022-05-20 LAB — MICROSCOPIC EXAMINATION

## 2022-05-20 LAB — URINALYSIS, ROUTINE W REFLEX MICROSCOPIC
Bilirubin, UA: NEGATIVE
Glucose, UA: NEGATIVE
Ketones, UA: NEGATIVE
Leukocytes,UA: NEGATIVE
Nitrite, UA: NEGATIVE
Protein,UA: NEGATIVE
RBC, UA: NEGATIVE
Specific Gravity, UA: 1.025 (ref 1.005–1.030)
Urobilinogen, Ur: 0.2 mg/dL (ref 0.2–1.0)
pH, UA: 5.5 (ref 5.0–7.5)

## 2022-05-20 LAB — BLADDER SCAN AMB NON-IMAGING: Scan Result: 58

## 2022-05-20 MED ORDER — GEMTESA 75 MG PO TABS: 1.0000 | ORAL_TABLET | Freq: Every day | ORAL | 3 refills | Status: DC

## 2022-05-20 NOTE — Progress Notes (Signed)
Assessment: 1. OAB (overactive bladder) - Urinalysis, Routine w reflex microscopic - BLADDER SCAN AMB NON-IMAGING  2. Bacteria in urine - Urine Culture  3. Kidney cysts    Plan: Urine sent for cx. If cx positive, will send in antibx. Pt informed of cx via phone message. Continue Gemtesa QD. Keep FU as scheduled for renal US results and PVR with UA for h/o renal cyst and OAB.  Meds ordered this encounter  Medications   Vibegron (GEMTESA) 75 MG TABS    Sig: Take 1 capsule by mouth daily.    Dispense:  90 tablet    Refill:  3     Chief Complaint: No chief complaint on file.   HPI: Anita Michael is a 56 y.o. female with h/o renal cyst who presents for continued evaluation of nocturia and urinary frequency. Pt started on trial of Gemtesa one month ago. Pt is now sleeping through the night and daytime sxs improved as well. Continues to have urgency, but denies incontinence. No gross hematuria. She denies burning, dysuria, lower abdominal pain. Pt denies soda intake and has been trying to stay hydrated.  UA=collected as pt left office-0-5 WBC, hyaline casts, calcium carbonate crystals, many bacteria, nitrite negative. PVR= 58 mL before voiding  04/19/22 Anita Michael is a 56yo here for followup for a right renal cyst. Renal US 9/20 shows a stable 4.9cm right renal cyst. Since last vist she had an MI and CVA. She is currently on Brilinta. NO hematuria. She has intermittent right flank pain that is dull, mild. She has nocturia 3-4x for the past 2-3 months. She has urinary frequency every 4-5 hours during the day.   Portions of the above documentation were copied from a prior visit for review purposes only.  Allergies: Allergies  Allergen Reactions   Nifedipine Other (See Comments) and Palpitations    Heart races   Latex Rash    PMH: Past Medical History:  Diagnosis Date   CAD (coronary artery disease)    a. s/p NSTEMI in 06/2021 with DES to mid-RCA. Residual disease  along D1 and 1st Mrg with medical management recommended.   Essential hypertension    Fibromyalgia    Generalized headaches    History of stroke    Noted incidentally by brain MRI July 2022   Mitral regurgitation    a. moderate to severe by echo in 06/2021   Polycythemia    PVC's (premature ventricular contractions)     PSH: Past Surgical History:  Procedure Laterality Date   APPENDECTOMY     BREAST BIOPSY Right 2015   Fibroadenoma   CHOLECYSTECTOMY     CORONARY STENT INTERVENTION N/A 07/06/2021   Procedure: CORONARY STENT INTERVENTION;  Surgeon: Sherren Mocha, MD;  Location: [place] [facility];  Service: Cardiovascular;  Laterality: N/A;   LEFT HEART CATH AND CORONARY ANGIOGRAPHY N/A 07/06/2021   Procedure: LEFT HEART CATH AND CORONARY ANGIOGRAPHY;  Surgeon: Sherren Mocha, MD;  Location: [place] [facility];  Service: Cardiovascular;  Laterality: N/A;   TONSILLECTOMY      SH: Social History   Tobacco Use   Smoking status: Some Days    Packs/day: 0.50    Types: Cigarettes    Last attempt to quit: 02/07/2019    Years since quitting: 3.2   Smokeless tobacco: Never  Vaping Use   Vaping Use: Never used  Substance Use Topics   Alcohol use: No   Drug use: No    ROS: All other review of systems  were reviewed and are negative except what is noted above in HPI  PE: BP 119/80   Pulse 84   Wt 180 lb (81.6 kg)   BMI 27.78 kg/m  GENERAL APPEARANCE:  Well appearing, well developed, well nourished, NAD HEENT:  Atraumatic, normocephalic NECK:  Supple. Trachea midline ABDOMEN:  Soft, non-tender, no masses EXTREMITIES:  Moves all extremities well NEUROLOGIC:  Alert and oriented x 3, normal gait MENTAL STATUS:  appropriate BACK:  Non-tender to palpation, No CVAT SKIN:  Warm, dry, and intact   Results: Laboratory Data: Lab Results  Component Value Date   WBC 8.8 07/07/2021   HGB 13.6 07/07/2021   HCT 45.3 07/07/2021   MCV 88.0 07/07/2021   PLT 206 07/07/2021     Lab Results  Component Value Date   CREATININE 0.94 07/07/2021     Lab Results  Component Value Date   HGBA1C 6.2 (H) 07/07/2021    Urinalysis    Component Value Date/Time   BILIRUBINUR Negative 10/29/2021 1336   PROTEINUR Negative 10/29/2021 1336   UROBILINOGEN 0.2 10/29/2021 1336   NITRITE Negative 10/29/2021 1336   LEUKOCYTESUR Trace (A) 10/29/2021 1336    No results found for: "LABMICR", "WBCUA", "RBCUA", "LABEPIT", "MUCUS", "BACTERIA"  Pertinent Imaging: No results found for this or any previous visit.  No results found for this or any previous visit.  No results found for this or any previous visit.  No results found for this or any previous visit.  Results for orders placed during the hospital encounter of 04/10/22  Ultrasound renal complete  Narrative CLINICAL DATA:  Follow-up kidney cyst.  EXAM: RENAL / URINARY TRACT ULTRASOUND COMPLETE  COMPARISON:  Renal ultrasound March 09, 2021  FINDINGS: Right Kidney:  Renal measurements: 7.3 x 4.4 x 3.0 cm = volume: 51 mL. The right kidney is atrophic with increased cortical echogenicity and cortical thinning. There is a cyst off the upper pole of the right kidney measuring 4.9 x 4.0 x 4.9 cm today versus 4.7 x 3.7 x 4.0 cm previously. The cyst is simple in appearance. No follow-up imaging recommended for the cyst.  Left Kidney:  Renal measurements: 13.2 x 6.2 x 6.7 cm = volume: 283 mL. Echogenicity within normal limits. No mass or hydronephrosis visualized.  Bladder:  Appears normal for degree of bladder distention.  Other:  None.  IMPRESSION: 1. The right kidney is atrophic with cortical thinning and increased echogenicity. There is a simple cyst off the upper pole of the right kidney as above. No follow-up imaging recommended for the cyst. 2. The left kidney and bladder are normal.   Electronically Signed By: Dorise Bullion III M.D. On: 04/11/2022 11:31  No valid procedures  specified. No results found for this or any previous visit.  No results found for this or any previous visit.  No results found for this or any previous visit (from the past 24 hour(s)).

## 2022-05-20 NOTE — Telephone Encounter (Signed)
Tried calling patient with no answer. Left detailed voice message for patient making her aware that UA shows no white cells, but does have some bacteria.  Sharee Pimple have ordered a culture. If the cx shows an infection an antibx will be sent in.

## 2022-05-20 NOTE — Progress Notes (Signed)
post void residual=58 

## 2022-05-24 LAB — URINE CULTURE

## 2022-05-28 ENCOUNTER — Ambulatory Visit: Payer: Medicaid Other | Admitting: Student

## 2022-06-06 NOTE — Progress Notes (Deleted)
Cardiology Office Note:    Date:  06/06/2022   ID:  Anita Michael, DOB 1966/05/07, MRN [id number]  PCP:  Gwenlyn Saran, [place] Providers Cardiologist:  Rozann Lesches, MD { Click to update primary MD,subspecialty MD or APP then REFRESH:1}  *** Referring MD: Alliance, Veronia Beets*   Chief Complaint:  No chief complaint on file. {Click here for Visit Info    :1}    History of Present Illness:   Anita Michael is a 56 y.o. Michael with history of CAD NSTEMI 06/2021 DES RCA, nonobstructive disease medical management.normal LVEF 60-65%,Mod-severe MR-plan to repeat 3-6 months, HTN, HLD.  I saw the patient 08/2021 requesting clearance for palmetto infusions for migraines but it's not to be used with recent cardiac events.   Echo 09/2021 normal LVEF with only mild MR.  She saw Dr. Domenic Polite 01/2022 and was considering cervical spine fusion. He recommended she hold off until 1 yr of DAPT and reassess.           Past Medical History:  Diagnosis Date   CAD (coronary artery disease)    a. s/p NSTEMI in 06/2021 with DES to mid-RCA. Residual disease along D1 and 1st Mrg with medical management recommended.   Essential hypertension    Fibromyalgia    Generalized headaches    History of stroke    Noted incidentally by brain MRI July 2022   Mitral regurgitation    a. moderate to severe by echo in 06/2021   Polycythemia    PVC's (premature ventricular contractions)    Current Medications: No outpatient medications have been marked as taking for the 06/19/22 encounter (Appointment) with Imogene Burn, PA-C.    Allergies:   Nifedipine and Latex   Social History   Tobacco Use   Smoking status: Some Days    Packs/day: 0.50    Types: Cigarettes    Last attempt to quit: 02/07/2019    Years since quitting: 3.3   Smokeless tobacco: Never  Vaping Use   Vaping Use: Never used  Substance Use Topics   Alcohol use: No   Drug use: No    Family  Hx: The patient's family history includes Breast cancer (age of onset: 34) in her sister; Breast cancer (age of onset: 57) in her sister; Breast cancer (age of onset: 48) in her mother; Breast cancer (age of onset: 74) in her maternal grandmother.  ROS     Physical Exam:    VS:  There were no vitals taken for this visit.    Wt Readings from Last 3 Encounters:  05/20/22 180 lb (81.6 kg)  02/13/22 184 lb 12.8 oz (83.8 kg)  12/21/21 184 lb 1.4 oz (83.5 kg)    Physical Exam  GEN: Well nourished, well developed, in no acute distress  HEENT: normal  Neck: no JVD, carotid bruits, or masses Cardiac:RRR; no murmurs, rubs, or gallops  Respiratory:  clear to auscultation bilaterally, normal work of breathing GI: soft, nontender, nondistended, + BS Ext: without cyanosis, clubbing, or edema, Good distal pulses bilaterally MS: no deformity or atrophy  Skin: warm and dry, no rash Neuro:  Alert and Oriented x 3, Strength and sensation are intact Psych: euthymic mood, full affect        EKGs/Labs/Other Test Reviewed:    EKG:  EKG is *** ordered today.  The ekg ordered today demonstrates ***  Recent Labs: 07/07/2021: BUN 5; Creatinine, Ser 0.94; Hemoglobin 13.6; Platelets 206; Potassium 4.5; Sodium 140  Recent Lipid Panel Recent Labs    11/26/21 1211  CHOL 131  TRIG 150*  HDL 36*  VLDL 30  LDLCALC 65     Prior CV Studies: {Select studies to display:26339}  Echo 09/2021 IMPRESSIONS     1. Left ventricular ejection fraction, by estimation, is 50 to 55%. The  left ventricle has low normal function. The left ventricle demonstrates  regional wall motion abnormalities (see scoring diagram/findings for  description). Left ventricular diastolic   parameters are consistent with Grade I diastolic dysfunction (impaired  relaxation).   2. Right ventricular systolic function is normal. The right ventricular  size is normal.   3. The mitral valve is normal in structure. Mild mitral  valve  regurgitation. No evidence of mitral stenosis.   4. The aortic valve is normal in structure. Aortic valve regurgitation is  not visualized. No aortic stenosis is present.   5. The inferior vena cava is normal in size with greater than 50%  respiratory variability, suggesting right atrial pressure of 3 mmHg.   Comparison(s): Prior images reviewed side by side.   FINDINGS   Left Ventricle: Left ventricular ejection fraction, by estimation, is 50  to 55%. The left ventricle has low normal function. The left ventricle  demonstrates regional wall motion abnormalities. The left ventricular  internal cavity size was normal in  size. There is no left ventricular hypertrophy. Left ventricular diastolic  parameters are consistent with Grade I diastolic dysfunction (impaired  relaxation).    Cardiac catheterization 07/06/2021:   1st Diag lesion is 50% stenosed.   1st Mrg lesion is 50% stenosed.   Mid RCA lesion is 99% stenosed.   A drug-eluting stent was successfully placed using a STENT ONYX FRONTIER 3.5X18.   Post intervention, there is a 0% residual stenosis.   1.  Severe single-vessel coronary artery disease with critical stenosis of the mid RCA, treated successfully with PCI using a 3.5 x 18 mm resolute Onyx DES 2.  Mild nonobstructive diagonal and obtuse marginal stenoses 3.  Mildly elevated LVEDP   Recommend: Continue cangrelor x2 hours then stop, check 2D echocardiogram, post MI medical therapy.  Dual antiplatelet therapy with aspirin and ticagrelor x12 months without interruption.  Medical therapy for mild residual coronary artery disease.   Echocardiogram 07/07/2021:  1. Left ventricular ejection fraction, by estimation, is 60 to 65%. The  left ventricle has normal function. The left ventricle has no regional  wall motion abnormalities. Left ventricular diastolic parameters were  normal.   2. Right ventricular systolic function is normal. The right ventricular  size is  normal.   3. Left atrial size was moderately dilated.   4. The mitral valve is normal in structure. Moderate to severe mitral  valve regurgitation. No evidence of mitral stenosis.   5. The aortic valve is tricuspid. Aortic valve regurgitation is not  visualized. No aortic stenosis is present.   6. The inferior vena cava is dilated in size with <50% respiratory  variability, suggesting right atrial pressure of 15 mmHg.        Risk Assessment/Calculations/Metrics:   {Does this patient have ATRIAL FIBRILLATION?[phone]}     No BP recorded.  {Refresh Note OR Click here to enter BP  :1}***    ASSESSMENT & PLAN:   No problem-specific Assessment & Plan notes found for this encounter.  CAD NSTEMI 06/2021 DES RCA, nonobstructive disease medical management.normal LVEF 60-65%-no angina. No regular exercise because of fibromyalgia. To start cardiac rehab next month. Continue ASA/Brilinta/metoprolol/losartan/Imdur  Mod-severe MR-plan to repeat the end of march   HTN BP up when stressed but also having dizziness from meds-she's not sure which ones. She will continue to monitor.   HLD will f/u FLP when she gets her echo.   Tobacco abuse-says she smokes less than a pack/month but smells of smoke. Cessation discussed.       {Are you ordering a CV Procedure (e.g. stress test, cath, DCCV, TEE, etc)?   Press F2        :[id number]}   Dispo:  No follow-ups on file.   Medication Adjustments/Labs and Tests Ordered: Current medicines are reviewed at length with the patient today.  Concerns regarding medicines are outlined above.  Tests Ordered: No orders of the defined types were placed in this encounter.  Medication Changes: No orders of the defined types were placed in this encounter.  Signed, Ermalinda Barrios, PA-C  06/06/2022 7:28 AM    [facility] [address] [address], [place],   [address] Phone: [phone]; Fax: [phone]

## 2022-06-19 ENCOUNTER — Other Ambulatory Visit: Payer: Self-pay | Admitting: Student

## 2022-06-19 ENCOUNTER — Ambulatory Visit: Payer: Medicaid Other | Admitting: Physician Assistant

## 2022-06-19 NOTE — Progress Notes (Addendum)
Cardiology Office Note:    Date:  06/26/2022   ID:  Anita Michael, DOB 1966/03/02, MRN [id number]  PCP:  Gwenlyn Saran, [place] Providers Cardiologist:  Rozann Lesches, MD     Referring MD: Alliance, [place]   Chief Complaint:  Follow-up     History of Present Illness:   Anita Michael is a 56 y.o. female with  with history of CAD NSTEMI 06/2021 DES RCA, nonobstructive disease medical management.normal LVEF 60-65%,Mod-severe MR-plan to repeat 3-6 months, HTN, HLD.   I saw the patient 08/2021 requesting clearance for palmetto infusions for migraines but it's not to be used with recent cardiac events.    Echo 09/2021 normal LVEF with only mild MR.   She saw Dr. Domenic Polite 01/2022 and was considering cervical spine fusion. He recommended she hold off until 1 yr of DAPT and reassess.           Patient comes in for f/u. She has occasional chest pain that she thinks may be from fibromyalgia. Sometimes sharp, sometimes tight. She took 1 ntg with relief. She goes to the gym 1-2 times a week. Smokes 1-2 cigarettes a month.  Needs clearance for cervical spine surgery Dr. Duffy Rhody [facility] Neurosurgery and spine. She works as a Oceanographer.      Past Medical History:  Diagnosis Date   CAD (coronary artery disease)    a. s/p NSTEMI in 06/2021 with DES to mid-RCA. Residual disease along D1 and 1st Mrg with medical management recommended.   Essential hypertension    Fibromyalgia    Generalized headaches    History of stroke    Noted incidentally by brain MRI July 2022   Mitral regurgitation    a. moderate to severe by echo in 06/2021   Polycythemia    PVC's (premature ventricular contractions)    Current Medications: Current Meds  Medication Sig   acetaminophen (TYLENOL) 500 MG tablet Take 1,000 mg by mouth every 6 (six) hours as needed (for pain.).   AIMOVIG 140 MG/ML SOAJ Inject 140 mg into the skin every 30 (thirty) days.    amitriptyline (ELAVIL) 50 MG tablet Take 50 mg by mouth at bedtime.   aspirin EC 81 MG EC tablet Take 1 tablet (81 mg total) by mouth daily. Swallow whole.   atorvastatin (LIPITOR) 80 MG tablet TAKE 1 TABLET BY MOUTH DAILY AT 6PM.   BRILINTA 90 MG TABS tablet TAKE 1 TABLET BY MOUTH TWICE DAILY.   cetirizine (ZYRTEC) 10 MG tablet Take 10 mg by mouth daily as needed for allergies.   cholecalciferol (VITAMIN D3) 25 MCG (1000 UT) tablet Take 1,000 Units by mouth daily in the afternoon.   cyclobenzaprine (FLEXERIL) 10 MG tablet Take 10 mg by mouth 2 (two) times daily as needed for muscle spasms.   fluticasone (FLONASE) 50 MCG/ACT nasal spray Place 2 sprays into both nostrils daily. (Patient taking differently: Place 2 sprays into both nostrils daily as needed for allergies.)   isosorbide mononitrate (IMDUR) 30 MG 24 hr tablet Take 1 tablet (30 mg total) by mouth daily. (Patient taking differently: Take 30 mg by mouth daily in the afternoon.)   losartan (COZAAR) 100 MG tablet TAKE ONE TABLET BY MOUTH ONCE DAILY.   metoprolol tartrate (LOPRESSOR) 25 MG tablet TAKE 1 TABLET BY MOUTH TWICE DAILY.   nitroGLYCERIN (NITROSTAT) 0.4 MG SL tablet Place 1 tablet (0.4 mg total) under the tongue every 5 (five) minutes x 3 doses as needed for  chest pain.   nystatin (MYCOSTATIN/NYSTOP) powder 1 application. 2 (two) times daily as needed (skin irritation).   nystatin ointment (MYCOSTATIN) Apply 1 application. topically once a week.   PREMARIN vaginal cream Place 1 application. vaginally once a week.   Vibegron (GEMTESA) 75 MG TABS Take 1 capsule by mouth daily.    Allergies:   Nifedipine and Latex   Social History   Tobacco Use   Smoking status: Some Days    Packs/day: 0.50    Types: Cigarettes    Last attempt to quit: 02/07/2019    Years since quitting: 3.3   Smokeless tobacco: Never  Vaping Use   Vaping Use: Never used  Substance Use Topics   Alcohol use: No   Drug use: No    Family Hx: The  patient's family history includes Breast cancer (age of onset: 83) in her sister; Breast cancer (age of onset: 55) in her sister; Breast cancer (age of onset: 68) in her mother; Breast cancer (age of onset: 92) in her maternal grandmother.  ROS     Physical Exam:    VS:  BP 124/78   Pulse 87   Ht 5' 7.5" (1.715 m)   Wt 180 lb (81.6 kg)   SpO2 95%   BMI 27.78 kg/m     Wt Readings from Last 3 Encounters:  06/26/22 180 lb (81.6 kg)  05/20/22 180 lb (81.6 kg)  02/13/22 184 lb 12.8 oz (83.8 kg)    Physical Exam  GEN: Well nourished, well developed, in no acute distress  Neck: no JVD, carotid bruits, or masses Cardiac:RRR; no murmurs, rubs, or gallops  Respiratory:  clear to auscultation bilaterally, normal work of breathing GI: soft, nontender, nondistended, + BS Ext: without cyanosis, clubbing, or edema, Good distal pulses bilaterally Neuro:  Alert and Oriented x 3,  Psych: euthymic mood, full affect        EKGs/Labs/Other Test Reviewed:    EKG:  EKG is   ordered today.  The ekg ordered today demonstrates NSR first degree AV block with RBBB and PVC's  Recent Labs: 07/07/2021: BUN 5; Creatinine, Ser 0.94; Hemoglobin 13.6; Platelets 206; Potassium 4.5; Sodium 140   Recent Lipid Panel Recent Labs    11/26/21 1211  CHOL 131  TRIG 150*  HDL 36*  VLDL 30  LDLCALC 65     Prior CV Studies:     Echo 09/2021 IMPRESSIONS     1. Left ventricular ejection fraction, by estimation, is 50 to 55%. The  left ventricle has low normal function. The left ventricle demonstrates  regional wall motion abnormalities (see scoring diagram/findings for  description). Left ventricular diastolic   parameters are consistent with Grade I diastolic dysfunction (impaired  relaxation).   2. Right ventricular systolic function is normal. The right ventricular  size is normal.   3. The mitral valve is normal in structure. Mild mitral valve  regurgitation. No evidence of mitral stenosis.    4. The aortic valve is normal in structure. Aortic valve regurgitation is  not visualized. No aortic stenosis is present.   5. The inferior vena cava is normal in size with greater than 50%  respiratory variability, suggesting right atrial pressure of 3 mmHg.   Comparison(s): Prior images reviewed side by side.   FINDINGS   Left Ventricle: Left ventricular ejection fraction, by estimation, is 50  to 55%. The left ventricle has low normal function. The left ventricle  demonstrates regional wall motion abnormalities. The left ventricular  internal cavity  size was normal in  size. There is no left ventricular hypertrophy. Left ventricular diastolic  parameters are consistent with Grade I diastolic dysfunction (impaired  relaxation).    Cardiac catheterization 07/06/2021:   1st Diag lesion is 50% stenosed.   1st Mrg lesion is 50% stenosed.   Mid RCA lesion is 99% stenosed.   A drug-eluting stent was successfully placed using a STENT ONYX FRONTIER 3.5X18.   Post intervention, there is a 0% residual stenosis.   1.  Severe single-vessel coronary artery disease with critical stenosis of the mid RCA, treated successfully with PCI using a 3.5 x 18 mm resolute Onyx DES 2.  Mild nonobstructive diagonal and obtuse marginal stenoses 3.  Mildly elevated LVEDP   Recommend: Continue cangrelor x2 hours then stop, check 2D echocardiogram, post MI medical therapy.  Dual antiplatelet therapy with aspirin and ticagrelor x12 months without interruption.  Medical therapy for mild residual coronary artery disease.   Echocardiogram 07/07/2021:  1. Left ventricular ejection fraction, by estimation, is 60 to 65%. The  left ventricle has normal function. The left ventricle has no regional  wall motion abnormalities. Left ventricular diastolic parameters were  normal.   2. Right ventricular systolic function is normal. The right ventricular  size is normal.   3. Left atrial size was moderately dilated.   4.  The mitral valve is normal in structure. Moderate to severe mitral  valve regurgitation. No evidence of mitral stenosis.   5. The aortic valve is tricuspid. Aortic valve regurgitation is not  visualized. No aortic stenosis is present.   6. The inferior vena cava is dilated in size with <50% respiratory  variability, suggesting right atrial pressure of 15 mmHg.         Risk Assessment/Calculations/Metrics:              ASSESSMENT & PLAN:   No problem-specific Assessment & Plan notes found for this encounter.   Preop clearance for cervical spine surgery Dr. Duffy Rhody [facility] Neurosurgery and spine.  She is one year out from NSTEMI and DES RCA. She has had some chest pain-typical and atypical  recently so will proceed with lexiscan prior to clearance. I reached out to Dr. Domenic Polite concerning holding ASA and Brilinta if lexiscan low risk. He was agreeable to this and then she can go on ASA alone and not have to restart brilinta.     According to the Revised Cardiac Risk Index (RCRI), her Perioperative Risk of Major Cardiac Event is (%): 6.6  Her Functional Capacity in METs is: 4.73 according to the [facility] Activity Status Index (DASI).     CAD NSTEMI 06/2021 DES RCA, nonobstructive disease medical management.normal LVEF 60-65%-no angina. Very little exercise because of fibromyalgia. Some chest pain hard for her to decipher from fibromyalgia pain but got relief from NTG once.  Continue ASA/Brilinta/metoprolol/losartan/Imdur   Mod-severe MR- now mild on f/u echo   HTN BP controlled   HLD LDL 65 11/2021   Tobacco abuse-says she smokes 1-2 cigarettes daily but smells of smoke. Cessation discussed.               Shared Decision Making/Informed Consent The risks [chest pain, shortness of breath, cardiac arrhythmias, dizziness, blood pressure fluctuations, myocardial infarction, stroke/transient ischemic attack, nausea, vomiting, allergic reaction, radiation exposure, metallic  taste sensation and life-threatening complications (estimated to be 1 in 10,000)], benefits (risk stratification, diagnosing coronary artery disease, treatment guidance) and alternatives of a nuclear stress test were discussed in detail with Ms.  Sigley and she agrees to proceed.   Dispo:  No follow-ups on file.   Medication Adjustments/Labs and Tests Ordered: Current medicines are reviewed at length with the patient today.  Concerns regarding medicines are outlined above.  Tests Ordered: Orders Placed This Encounter  Procedures   NM Myocar Multi W/Spect W/Wall Motion / EF   CBC   Comprehensive metabolic panel   Medication Changes: No orders of the defined types were placed in this encounter.  Signed, Ermalinda Barrios, PA-C  06/26/2022 3:11 PM    [address], [place], [place]  [address] Phone: [phone]; Fax: [phone]

## 2022-06-26 ENCOUNTER — Ambulatory Visit: Payer: Medicaid Other | Attending: Student | Admitting: Physician Assistant

## 2022-06-26 ENCOUNTER — Encounter: Payer: Self-pay | Admitting: Physician Assistant

## 2022-06-26 VITALS — BP 124/78 | HR 87 | Ht 67.5 in | Wt 180.0 lb

## 2022-06-26 DIAGNOSIS — I10 Essential (primary) hypertension: Secondary | ICD-10-CM | POA: Diagnosis not present

## 2022-06-26 DIAGNOSIS — Z01818 Encounter for other preprocedural examination: Secondary | ICD-10-CM | POA: Diagnosis not present

## 2022-06-26 DIAGNOSIS — R0789 Other chest pain: Secondary | ICD-10-CM | POA: Diagnosis not present

## 2022-06-26 DIAGNOSIS — E782 Mixed hyperlipidemia: Secondary | ICD-10-CM | POA: Diagnosis not present

## 2022-06-26 DIAGNOSIS — I25119 Atherosclerotic heart disease of native coronary artery with unspecified angina pectoris: Secondary | ICD-10-CM

## 2022-06-26 DIAGNOSIS — I059 Rheumatic mitral valve disease, unspecified: Secondary | ICD-10-CM

## 2022-06-26 DIAGNOSIS — Z720 Tobacco use: Secondary | ICD-10-CM

## 2022-06-26 NOTE — Patient Instructions (Addendum)
Medication Instructions:  Your physician recommends that you continue on your current medications as directed. Please refer to the Current Medication list given to you today.  *If you need a refill on your cardiac medications before your next appointment, please call your pharmacy*  Lab Work: TODAY: CBC and CMET If you have labs (blood work) drawn today and your tests are completely normal, you will receive your results only by: [place] (if you have MyChart) OR A paper copy in the mail If you have any lab test that is abnormal or we need to change your treatment, we will call you to review the results.  Testing/Procedures: Your physician has requested that you have a lexiscan myoview at [facility].   Follow-Up: At [facility], you and your health needs are our priority.  As part of our continuing mission to provide you with exceptional heart care, we have created designated Provider Care Teams.  These Care Teams include your primary Cardiologist (physician) and Advanced Practice Providers (APPs -  Physician Assistants and Nurse Practitioners) who all work together to provide you with the care you need, when you need it.  Your next appointment:   6 month(s)  The format for your next appointment:   In Person  Provider:   Rozann Lesches, MD    Important Information About Sugar

## 2022-06-27 LAB — CBC
Hematocrit: 48.5 % — ABNORMAL HIGH (ref 34.0–46.6)
Hemoglobin: 15.1 g/dL (ref 11.1–15.9)
MCH: 26.8 pg (ref 26.6–33.0)
MCHC: 31.1 g/dL — ABNORMAL LOW (ref 31.5–35.7)
MCV: 86 fL (ref 79–97)
Platelets: 276 10*3/uL (ref 150–450)
RBC: 5.63 x10E6/uL — ABNORMAL HIGH (ref 3.77–5.28)
RDW: 15.8 % — ABNORMAL HIGH (ref 11.7–15.4)
WBC: 9.5 10*3/uL (ref 3.4–10.8)

## 2022-06-27 LAB — COMPREHENSIVE METABOLIC PANEL
ALT: 27 IU/L (ref 0–32)
AST: 21 IU/L (ref 0–40)
Albumin/Globulin Ratio: 2 (ref 1.2–2.2)
Albumin: 4.1 g/dL (ref 3.8–4.9)
Alkaline Phosphatase: 172 IU/L — ABNORMAL HIGH (ref 44–121)
BUN/Creatinine Ratio: 9 (ref 9–23)
BUN: 7 mg/dL (ref 6–24)
Bilirubin Total: 0.6 mg/dL (ref 0.0–1.2)
CO2: 27 mmol/L (ref 20–29)
Calcium: 10.3 mg/dL — ABNORMAL HIGH (ref 8.7–10.2)
Chloride: 105 mmol/L (ref 96–106)
Creatinine, Ser: 0.79 mg/dL (ref 0.57–1.00)
Globulin, Total: 2.1 g/dL (ref 1.5–4.5)
Glucose: 91 mg/dL (ref 70–99)
Potassium: 4.2 mmol/L (ref 3.5–5.2)
Sodium: 143 mmol/L (ref 134–144)
Total Protein: 6.2 g/dL (ref 6.0–8.5)
eGFR: 87 mL/min/{1.73_m2} (ref >59)

## 2022-06-28 NOTE — Addendum Note (Signed)
Addended by: Janan Halter F on: 06/28/2022 10:15 AM   Modules accepted: Orders

## 2022-07-04 ENCOUNTER — Encounter (HOSPITAL_COMMUNITY): Payer: Medicaid Other

## 2022-07-04 ENCOUNTER — Ambulatory Visit (HOSPITAL_COMMUNITY): Payer: Medicaid Other

## 2022-07-11 ENCOUNTER — Encounter (HOSPITAL_COMMUNITY)
Admission: RE | Admit: 2022-07-11 | Discharge: 2022-07-11 | Disposition: A | Discharge status: Home / Self Care | Payer: Medicaid Other | Source: Ambulatory Visit | Attending: Physician Assistant | Admitting: Physician Assistant

## 2022-07-11 ENCOUNTER — Ambulatory Visit (HOSPITAL_COMMUNITY)
Admission: RE | Admit: 2022-07-11 | Discharge: 2022-07-11 | Disposition: A | Discharge status: Home / Self Care | Payer: Medicaid Other | Source: Ambulatory Visit | Attending: Cardiology | Admitting: Cardiology

## 2022-07-11 ENCOUNTER — Encounter (HOSPITAL_COMMUNITY): Payer: Self-pay

## 2022-07-11 DIAGNOSIS — R0789 Other chest pain: Secondary | ICD-10-CM | POA: Insufficient documentation

## 2022-07-11 HISTORY — DX: Disorder of kidney and ureter, unspecified: N28.9

## 2022-07-11 LAB — NM MYOCAR MULTI W/SPECT W/WALL MOTION / EF
Base ST Depression (mm): 0 mm
Estimated workload: 1
Exercise duration (min): 0 min
Exercise duration (sec): 0 s
LV dias vol: 75 mL (ref 46–106)
LV sys vol: 38 mL
MPHR: 162 {beats}/min
Nuc Stress EF: 50 %
Peak HR: 93 {beats}/min
Percent HR: 57 %
RATE: 0.3
Rest HR: 76 {beats}/min
Rest Nuclear Isotope Dose: 10.4 mCi
SDS: 2
SRS: 3
SSS: 5
ST Depression (mm): 0 mm
Stress Nuclear Isotope Dose: 33 mCi
TID: 1.11

## 2022-07-11 MED ORDER — SODIUM CHLORIDE FLUSH 0.9 % IV SOLN
INTRAVENOUS | Status: AC
  Administered 2022-07-11: 10 mL via INTRAVENOUS
  Filled 2022-07-11: qty 10

## 2022-07-11 MED ORDER — TECHNETIUM TC 99M TETROFOSMIN IV KIT
30.0000 | PACK | Freq: Once | INTRAVENOUS | Status: AC | PRN
  Administered 2022-07-11: 33 via INTRAVENOUS

## 2022-07-11 MED ORDER — REGADENOSON 0.4 MG/5ML IV SOLN
INTRAVENOUS | Status: AC
  Administered 2022-07-11: 0.4 mg via INTRAVENOUS
  Filled 2022-07-11: qty 5

## 2022-07-11 MED ORDER — TECHNETIUM TC 99M TETROFOSMIN IV KIT
10.0000 | PACK | Freq: Once | INTRAVENOUS | Status: AC | PRN
  Administered 2022-07-11: 10.4 via INTRAVENOUS

## 2022-07-31 DIAGNOSIS — Z7902 Long term (current) use of antithrombotics/antiplatelets: Secondary | ICD-10-CM | POA: Insufficient documentation

## 2022-07-31 DIAGNOSIS — Z6828 Body mass index (BMI) 28.0-28.9, adult: Secondary | ICD-10-CM | POA: Insufficient documentation

## 2022-08-02 ENCOUNTER — Other Ambulatory Visit: Payer: Self-pay | Admitting: Otolaryngology

## 2022-08-02 ENCOUNTER — Encounter (HOSPITAL_BASED_OUTPATIENT_CLINIC_OR_DEPARTMENT_OTHER): Payer: Self-pay | Admitting: Otolaryngology

## 2022-08-05 NOTE — Progress Notes (Signed)
Chart Reviewed with Dr.Singer. Ok for [facility].

## 2022-08-06 ENCOUNTER — Encounter (HOSPITAL_BASED_OUTPATIENT_CLINIC_OR_DEPARTMENT_OTHER)
Admission: RE | Disposition: A | Discharge status: Home / Self Care | Payer: Self-pay | Source: Home / Self Care | Attending: Otolaryngology

## 2022-08-06 ENCOUNTER — Ambulatory Visit (HOSPITAL_BASED_OUTPATIENT_CLINIC_OR_DEPARTMENT_OTHER): Payer: Medicaid Other | Admitting: Certified Registered"

## 2022-08-06 ENCOUNTER — Telehealth: Payer: Self-pay | Admitting: Physician Assistant

## 2022-08-06 ENCOUNTER — Encounter (HOSPITAL_BASED_OUTPATIENT_CLINIC_OR_DEPARTMENT_OTHER): Payer: Self-pay | Admitting: Otolaryngology

## 2022-08-06 ENCOUNTER — Ambulatory Visit (HOSPITAL_BASED_OUTPATIENT_CLINIC_OR_DEPARTMENT_OTHER)
Admission: RE | Admit: 2022-08-06 | Discharge: 2022-08-06 | Disposition: A | Discharge status: Home / Self Care | Payer: Medicaid Other | Attending: Otolaryngology | Admitting: Otolaryngology

## 2022-08-06 ENCOUNTER — Other Ambulatory Visit: Payer: Self-pay

## 2022-08-06 DIAGNOSIS — N289 Disorder of kidney and ureter, unspecified: Secondary | ICD-10-CM | POA: Diagnosis not present

## 2022-08-06 DIAGNOSIS — Z6827 Body mass index (BMI) 27.0-27.9, adult: Secondary | ICD-10-CM | POA: Diagnosis not present

## 2022-08-06 DIAGNOSIS — I252 Old myocardial infarction: Secondary | ICD-10-CM | POA: Diagnosis not present

## 2022-08-06 DIAGNOSIS — Z955 Presence of coronary angioplasty implant and graft: Secondary | ICD-10-CM | POA: Diagnosis not present

## 2022-08-06 DIAGNOSIS — F17210 Nicotine dependence, cigarettes, uncomplicated: Secondary | ICD-10-CM | POA: Insufficient documentation

## 2022-08-06 DIAGNOSIS — Z79899 Other long term (current) drug therapy: Secondary | ICD-10-CM | POA: Diagnosis not present

## 2022-08-06 DIAGNOSIS — I10 Essential (primary) hypertension: Secondary | ICD-10-CM | POA: Diagnosis not present

## 2022-08-06 DIAGNOSIS — M797 Fibromyalgia: Secondary | ICD-10-CM | POA: Insufficient documentation

## 2022-08-06 DIAGNOSIS — G4733 Obstructive sleep apnea (adult) (pediatric): Secondary | ICD-10-CM | POA: Diagnosis present

## 2022-08-06 DIAGNOSIS — E663 Overweight: Secondary | ICD-10-CM | POA: Insufficient documentation

## 2022-08-06 DIAGNOSIS — I2510 Atherosclerotic heart disease of native coronary artery without angina pectoris: Secondary | ICD-10-CM | POA: Diagnosis not present

## 2022-08-06 HISTORY — DX: Sleep apnea, unspecified: G47.30

## 2022-08-06 HISTORY — PX: DRUG INDUCED ENDOSCOPY: SHX6808

## 2022-08-06 SURGERY — DRUG INDUCED SLEEP ENDOSCOPY
Anesthesia: Monitor Anesthesia Care | Site: Nose | Laterality: Bilateral

## 2022-08-06 MED ORDER — OXYCODONE HCL 5 MG PO TABS: 5.0000 mg | ORAL_TABLET | Freq: Once | ORAL | Status: DC | PRN

## 2022-08-06 MED ORDER — PROPOFOL 500 MG/50ML IV EMUL
INTRAVENOUS | Status: DC | PRN
  Administered 2022-08-06: 45 ug/kg/min via INTRAVENOUS

## 2022-08-06 MED ORDER — LACTATED RINGERS IV SOLN: INTRAVENOUS | Status: DC

## 2022-08-06 MED ORDER — ONDANSETRON HCL 4 MG/2ML IJ SOLN
INTRAMUSCULAR | Status: DC | PRN
  Administered 2022-08-06: 4 mg via INTRAVENOUS

## 2022-08-06 MED ORDER — OXYMETAZOLINE HCL 0.05 % NA SOLN
NASAL | Status: DC | PRN
  Administered 2022-08-06: 1 via TOPICAL

## 2022-08-06 MED ORDER — HYDROMORPHONE HCL 1 MG/ML IJ SOLN: 0.2500 mg | INTRAMUSCULAR | Status: DC | PRN

## 2022-08-06 MED ORDER — PROMETHAZINE HCL 25 MG/ML IJ SOLN: 6.2500 mg | INTRAMUSCULAR | Status: DC | PRN

## 2022-08-06 MED ORDER — LIDOCAINE HCL (CARDIAC) PF 100 MG/5ML IV SOSY
PREFILLED_SYRINGE | INTRAVENOUS | Status: DC | PRN
  Administered 2022-08-06: 30 mg via INTRAVENOUS

## 2022-08-06 MED ORDER — OXYCODONE HCL 5 MG/5ML PO SOLN: 5.0000 mg | Freq: Once | ORAL | Status: DC | PRN

## 2022-08-06 SURGICAL SUPPLY — 15 items
CANISTER SUCT 1200ML W/VALVE (MISCELLANEOUS) ×1 IMPLANT
GLOVE BIO SURGEON STRL SZ7.5 (GLOVE) ×1 IMPLANT
GLOVE BIOGEL PI IND STRL 7.0 (GLOVE) IMPLANT
GLOVE SURG SYN 7.5  E (GLOVE) ×1
GLOVE SURG SYN 7.5 E (GLOVE) ×1 IMPLANT
GLOVE SURG SYN 7.5 PF PI (GLOVE) IMPLANT
KIT CLEAN ENDO (MISCELLANEOUS) ×1 IMPLANT
NDL HYPO 27GX1-1/4 (NEEDLE) IMPLANT
NEEDLE HYPO 27GX1-1/4 (NEEDLE) IMPLANT
PATTIES SURGICAL .5 X3 (DISPOSABLE) ×1 IMPLANT
SHEET MEDIUM DRAPE 40X70 STRL (DRAPES) ×1 IMPLANT
SOL ANTI FOG 6CC (MISCELLANEOUS) ×1 IMPLANT
SYR CONTROL 10ML LL (SYRINGE) IMPLANT
TOWEL GREEN STERILE FF (TOWEL DISPOSABLE) ×1 IMPLANT
TUBE CONNECTING 20X1/4 (TUBING) ×1 IMPLANT

## 2022-08-06 NOTE — Telephone Encounter (Signed)
Patient is calling for her stress test results.

## 2022-08-06 NOTE — Brief Op Note (Signed)
08/06/2022  8:07 AM  PATIENT:  Anita Michael  57 y.o. female  PRE-OPERATIVE DIAGNOSIS:  Obstructive Sleep Apnea, Antiplatelet or antithrombotic long-term use, BMI 28.0-28.9,adult  POST-OPERATIVE DIAGNOSIS:  Obstructive Sleep Apnea, Antiplatelet or antithrombotic long-term use, BMI 28.0-28.9,adult  PROCEDURE:  Procedure(s): DRUG INDUCED ENDOSCOPY (Bilateral)  SURGEON:  Surgeon(s) and Role:    Melida Quitter, MD - Primary  PHYSICIAN ASSISTANT:   ASSISTANTS: none   ANESTHESIA:   IV sedation  EBL:  None   BLOOD ADMINISTERED:none  DRAINS: none   LOCAL MEDICATIONS USED:  NONE  SPECIMEN:  No Specimen  DISPOSITION OF SPECIMEN:  N/A  COUNTS:  YES  TOURNIQUET:  * No tourniquets in log *  DICTATION: .Note written in EPIC  PLAN OF CARE: Discharge to home after PACU  PATIENT DISPOSITION:  PACU - hemodynamically stable.   Delay start of Pharmacological VTE agent (>24hrs) due to surgical blood loss or risk of bleeding: no

## 2022-08-06 NOTE — Anesthesia Postprocedure Evaluation (Signed)
Anesthesia Post Note  Patient: Anita Michael  Procedure(s) Performed: DRUG INDUCED ENDOSCOPY (Bilateral: Nose)     Patient location during evaluation: PACU Anesthesia Type: MAC Level of consciousness: awake and alert Pain management: pain level controlled Vital Signs Assessment: post-procedure vital signs reviewed and stable Respiratory status: spontaneous breathing, nonlabored ventilation and respiratory function stable Cardiovascular status: blood pressure returned to baseline and stable Postop Assessment: no apparent nausea or vomiting Anesthetic complications: no   No notable events documented.  Last Vitals:  Vitals:   08/06/22 0815 08/06/22 0827  BP: 115/74 (!) 127/92  Pulse: 73 70  Resp: 19 18  Temp:  36.6 C  SpO2: 97% 98%    Last Pain:  Vitals:   08/06/22 0827  TempSrc:   PainSc: 0-No pain                 Lynda Rainwater

## 2022-08-06 NOTE — Anesthesia Preprocedure Evaluation (Addendum)
Anesthesia Evaluation  Patient identified by MRN, date of birth, ID band Patient awake    Reviewed: Allergy & Precautions, H&P , NPO status , Patient's Chart, lab work & pertinent test results  Airway Mallampati: II  TM Distance: >3 FB Neck ROM: Full    Dental  (+) Edentulous Upper, Edentulous Lower   Pulmonary sleep apnea , Current Smoker and Patient abstained from smoking.   Pulmonary exam normal breath sounds clear to auscultation       Cardiovascular hypertension, Pt. on medications + CAD, + Past MI and + Cardiac Stents  Normal cardiovascular exam Rhythm:Regular Rate:Normal     Neuro/Psych  Headaches  Anxiety      negative psych ROS   GI/Hepatic negative GI ROS, Neg liver ROS,,,  Endo/Other  negative endocrine ROS    Renal/GU Renal InsufficiencyRenal disease  negative genitourinary   Musculoskeletal  (+)  Fibromyalgia -  Abdominal   Peds negative pediatric ROS (+)  Hematology negative hematology ROS (+)   Anesthesia Other Findings   Reproductive/Obstetrics negative OB ROS                             Anesthesia Physical Anesthesia Plan  ASA: 3  Anesthesia Plan: MAC   Post-op Pain Management: Minimal or no pain anticipated   Induction: Intravenous  PONV Risk Score and Plan: 1 and Ondansetron and Treatment may vary due to age or medical condition  Airway Management Planned: Natural Airway  Additional Equipment:   Intra-op Plan:   Post-operative Plan:   Informed Consent: I have reviewed the patients History and Physical, chart, labs and discussed the procedure including the risks, benefits and alternatives for the proposed anesthesia with the patient or authorized representative who has indicated his/her understanding and acceptance.     Dental advisory given  Plan Discussed with: CRNA  Anesthesia Plan Comments:         Anesthesia Quick Evaluation

## 2022-08-06 NOTE — Anesthesia Procedure Notes (Signed)
Procedure Name: MAC Date/Time: 08/06/2022 8:06 AM  Performed by: Signe Colt, CRNAPre-anesthesia Checklist: Patient identified, Emergency Drugs available, Suction available, Patient being monitored and Timeout performed Patient Re-evaluated:Patient Re-evaluated prior to induction Oxygen Delivery Method: Simple face mask

## 2022-08-06 NOTE — Op Note (Signed)
Preop diagnosis: Obstructive sleep apnea Postop diagnosis: same Procedure: Drug-induced sleep endoscopy Surgeon: Redmond Baseman Anesth: IV sedation Compl: None Findings: There is 100% anterior-posterior collapse at the velum making her a candidate for hypoglossal nerve stimulator placement.  There was also anterior-posterior collapse at the tongue base. Description:  After discussing risks, benefits, and alternatives, the patient was brought to the operative suite and placed on the operative table in the supine position.  Anesthesia was induced and the patient was given light sedation to simulate natural sleep. When the proper level was reached, an Afrin-soaked pledget was placed in the right nasal passage for a couple of minutes and then removed.  The fiberoptic laryngoscope was then passed to view the pharynx and larynx.  Findings are noted above and the exam was recorded.  After completion, the scope was removed and the patient was returned to anesthesia for wakeup and was moved to the recovery room in stable condition.

## 2022-08-06 NOTE — Transfer of Care (Signed)
Immediate Anesthesia Transfer of Care Note  Patient: Anita Michael  Procedure(s) Performed: DRUG INDUCED ENDOSCOPY (Bilateral: Nose)  Patient Location: PACU  Anesthesia Type:MAC  Level of Consciousness: awake, alert , oriented, and drowsy  Airway & Oxygen Therapy: Patient Spontanous Breathing and Patient connected to face mask oxygen  Post-op Assessment: Report given to RN and Post -op Vital signs reviewed and stable  Post vital signs: Reviewed and stable  Last Vitals:  Vitals Value Taken Time  BP    Temp    Pulse 74 08/06/22 0812  Resp    SpO2 97 % 08/06/22 0812    Last Pain:  Vitals:   08/06/22 0651  TempSrc: Oral  PainSc: 0-No pain         Complications: No notable events documented.

## 2022-08-06 NOTE — Discharge Instructions (Signed)
  Post Anesthesia Home Care Instructions ° °Activity: °Get plenty of rest for the remainder of the day. A responsible individual must stay with you for 24 hours following the procedure.  °For the next 24 hours, DO NOT: °-Drive a car °-Operate machinery °-Drink alcoholic beverages °-Take any medication unless instructed by your physician °-Make any legal decisions or sign important papers. ° °Meals: °Start with liquid foods such as gelatin or soup. Progress to regular foods as tolerated. Avoid greasy, spicy, heavy foods. If nausea and/or vomiting occur, drink only clear liquids until the nausea and/or vomiting subsides. Call your physician if vomiting continues. ° °Special Instructions/Symptoms: °Your throat may feel dry or sore from the anesthesia or the breathing tube placed in your throat during surgery. If this causes discomfort, gargle with warm salt water. The discomfort should disappear within 24 hours. ° °If you had a scopolamine patch placed behind your ear for the management of post- operative nausea and/or vomiting: ° °1. The medication in the patch is effective for 72 hours, after which it should be removed.  Wrap patch in a tissue and discard in the trash. Wash hands thoroughly with soap and water. °2. You may remove the patch earlier than 72 hours if you experience unpleasant side effects which may include dry mouth, dizziness or visual disturbances. °3. Avoid touching the patch. Wash your hands with soap and water after contact with the patch. °  ° °

## 2022-08-06 NOTE — H&P (Signed)
Anita Michael is an 57 y.o. female.   Chief Complaint: Sleep apnea HPI: 57 year old female with sleep apnea who has been unable to tolerate CPAP.  Past Medical History:  Diagnosis Date   CAD (coronary artery disease)    a. s/p NSTEMI in 06/2021 with DES to mid-RCA. Residual disease along D1 and 1st Mrg with medical management recommended.   Essential hypertension    Fibromyalgia    Generalized headaches    History of stroke    Noted incidentally by brain MRI July 2022   Mitral regurgitation    a. moderate to severe by echo in 06/2021   Polycythemia    PVC's (premature ventricular contractions)    Renal insufficiency    Sleep apnea     Past Surgical History:  Procedure Laterality Date   APPENDECTOMY     BREAST BIOPSY Right 2015   Fibroadenoma   CHOLECYSTECTOMY     CORONARY STENT INTERVENTION N/A 07/06/2021   Procedure: CORONARY STENT INTERVENTION;  Surgeon: Sherren Mocha, MD;  Location: [facility];  Service: Cardiovascular;  Laterality: N/A;   LEFT HEART CATH AND CORONARY ANGIOGRAPHY N/A 07/06/2021   Procedure: LEFT HEART CATH AND CORONARY ANGIOGRAPHY;  Surgeon: Sherren Mocha, MD;  Location: [facility];  Service: Cardiovascular;  Laterality: N/A;   TONSILLECTOMY      Family History  Problem Relation Age of Onset   Breast cancer Mother 12   Breast cancer Sister 37   Breast cancer Maternal Grandmother 13   Breast cancer Sister 64   Social History:  reports that she has been smoking cigarettes. She has been smoking an average of .5 packs per day. She has never used smokeless tobacco. She reports that she does not drink alcohol and does not use drugs.  Allergies:  Allergies  Allergen Reactions   Nifedipine Other (See Comments) and Palpitations    Heart races   Latex Rash    Medications Prior to Admission  Medication Sig Dispense Refill   AIMOVIG 140 MG/ML SOAJ Inject 140 mg into the skin every 30 (thirty) days.     amitriptyline (ELAVIL) 50 MG  tablet Take 50 mg by mouth at bedtime.     aspirin EC 81 MG EC tablet Take 1 tablet (81 mg total) by mouth daily. Swallow whole. 30 tablet 11   atorvastatin (LIPITOR) 80 MG tablet TAKE 1 TABLET BY MOUTH DAILY AT 6PM. 90 tablet 2   BRILINTA 90 MG TABS tablet TAKE 1 TABLET BY MOUTH TWICE DAILY. 180 tablet 0   cetirizine (ZYRTEC) 10 MG tablet Take 10 mg by mouth daily as needed for allergies.     cholecalciferol (VITAMIN D3) 25 MCG (1000 UT) tablet Take 1,000 Units by mouth daily in the afternoon.     cyclobenzaprine (FLEXERIL) 10 MG tablet Take 10 mg by mouth 2 (two) times daily as needed for muscle spasms.     isosorbide mononitrate (IMDUR) 30 MG 24 hr tablet Take 1 tablet (30 mg total) by mouth daily. (Patient taking differently: Take 30 mg by mouth daily in the afternoon.) 90 tablet 3   losartan (COZAAR) 100 MG tablet TAKE ONE TABLET BY MOUTH ONCE DAILY. 90 tablet 2   metoprolol tartrate (LOPRESSOR) 25 MG tablet TAKE 1 TABLET BY MOUTH TWICE DAILY. 180 tablet 0   nystatin (MYCOSTATIN/NYSTOP) powder 1 application. 2 (two) times daily as needed (skin irritation).     nystatin ointment (MYCOSTATIN) Apply 1 application. topically once a week.  Vibegron (GEMTESA) 75 MG TABS Take 1 capsule by mouth daily. 90 tablet 3   fluticasone (FLONASE) 50 MCG/ACT nasal spray Place 2 sprays into both nostrils daily. (Patient taking differently: Place 2 sprays into both nostrils daily as needed for allergies.) 16 g 0   nitroGLYCERIN (NITROSTAT) 0.4 MG SL tablet Place 1 tablet (0.4 mg total) under the tongue every 5 (five) minutes x 3 doses as needed for chest pain. 25 tablet 2   PREMARIN vaginal cream Place 1 application. vaginally once a week.      No results found for this or any previous visit (from the past 48 hour(s)). No results found.  Review of Systems  All other systems reviewed and are negative.   Blood pressure 136/88, pulse 67, temperature (!) 97.3 F (36.3 C), temperature source Oral, resp.  rate 16, height 5' 7.5" (1.715 m), weight 81.9 kg, SpO2 97 %. Physical Exam Constitutional:      Appearance: Normal appearance. She is normal weight.  HENT:     Head: Normocephalic and atraumatic.     Right Ear: External ear normal.     Left Ear: External ear normal.     Nose: Nose normal.     Mouth/Throat:     Mouth: Mucous membranes are moist.     Pharynx: Oropharynx is clear.  Eyes:     Extraocular Movements: Extraocular movements intact.     Conjunctiva/sclera: Conjunctivae normal.     Pupils: Pupils are equal, round, and reactive to light.  Cardiovascular:     Rate and Rhythm: Normal rate.  Pulmonary:     Effort: Pulmonary effort is normal.  Musculoskeletal:     Cervical back: Normal range of motion.  Skin:    General: Skin is warm and dry.  Neurological:     General: No focal deficit present.     Mental Status: She is alert and oriented to person, place, and time.  Psychiatric:        Mood and Affect: Mood normal.        Behavior: Behavior normal.        Thought Content: Thought content normal.        Judgment: Judgment normal.      Assessment/Plan Obstructive sleep apnea and BMI 27.86  To OR for sleep endoscopy.  Melida Quitter, MD 08/06/2022, 7:38 AM

## 2022-08-07 ENCOUNTER — Encounter (HOSPITAL_BASED_OUTPATIENT_CLINIC_OR_DEPARTMENT_OTHER): Payer: Self-pay | Admitting: Otolaryngology

## 2022-08-07 NOTE — Telephone Encounter (Signed)
Left a message for patient to call office back regarding results.  

## 2022-08-07 NOTE — Telephone Encounter (Signed)
Mychart message sent to patient.

## 2022-08-07 NOTE — Telephone Encounter (Signed)
  Imogene Burn, PA-C 07/12/2022  8:15 AM EST     Stress test normal without ischemia, she can proceed with surgery and is ok to hold brilinta and aspirin. After surgery she can resume ASA 81 mg alone without brilinta per Dr. Domenic Polite. Have copied surgeon, Dr. Duffy Rhody

## 2022-08-07 NOTE — Progress Notes (Signed)
Left message stating courtesy call and if any questions or concerns please call the doctors office.  

## 2022-08-20 IMAGING — MR MR CERVICAL SPINE W/O CM
5 series · 35 of 48 positions shown · non-contrast
Comparison: None available.

CLINICAL DATA: Initial evaluation for neck pain with left-sided
weakness, headache, myelopathy. History of fibromyalgia.

EXAM:
MRI CERVICAL SPINE WITHOUT CONTRAST
TECHNIQUE: Multiplanar, multisequence MR imaging of the cervical spine was
performed. No intravenous contrast was administered.

[Series 5: T2 · sagittal · 3.0mm · 0.69mm/px · 6 of 15 slices shown (1 of 2)]
[im 1/15]
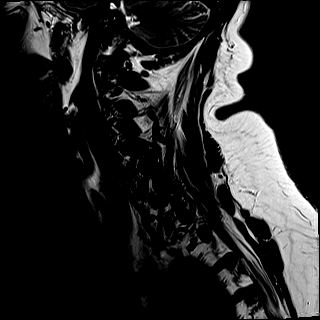
[im 3/15]
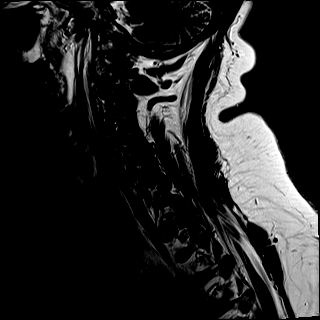
[im 6/15]
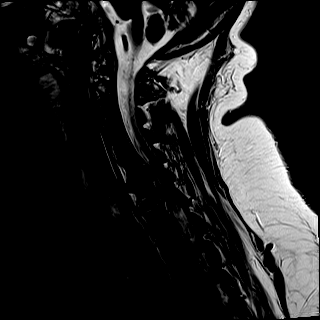
[im 9/15]
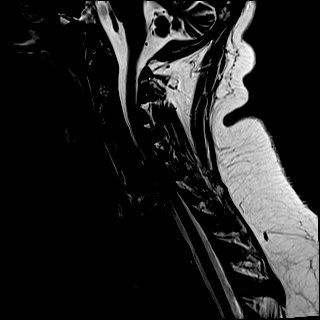
[im 12/15]
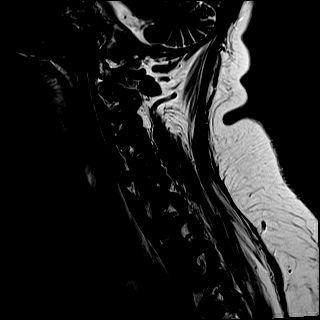
[im 15/15]
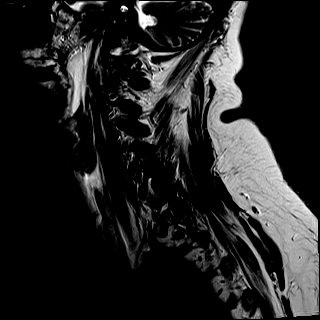

[Series 6: T1 · sagittal · 3.0mm · 0.86mm/px · 7 of 15 slices shown]
[im 1/15]
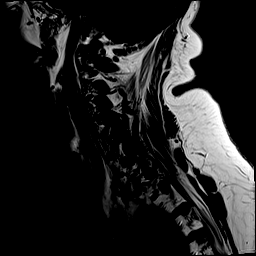
[im 3/15]
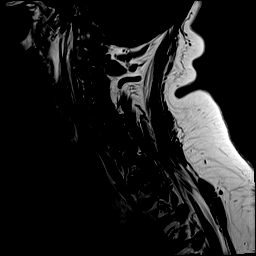
[im 5/15]
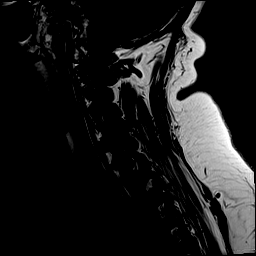
[im 8/15]
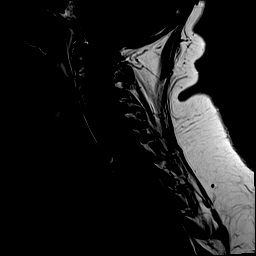
[im 10/15]
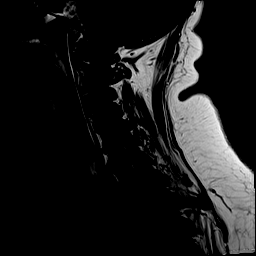
[im 12/15]
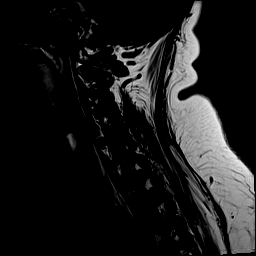
[im 15/15]
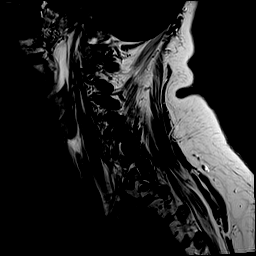

[Series 7: STIR · sagittal · 3.0mm · 0.69mm/px · 7 of 15 slices shown]
[im 1/15]
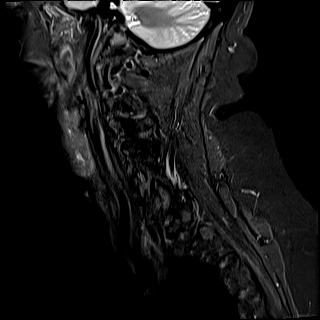
[im 3/15]
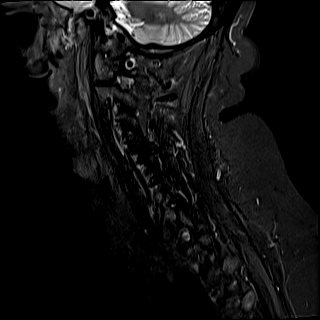
[im 5/15]
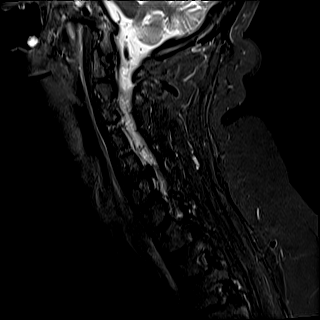
[im 8/15]
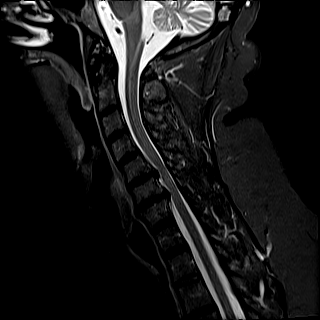
[im 10/15]
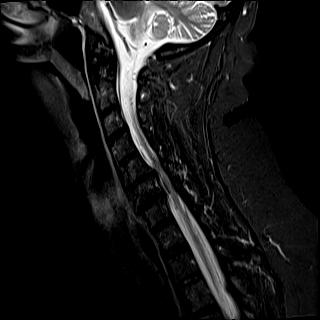
[im 12/15]
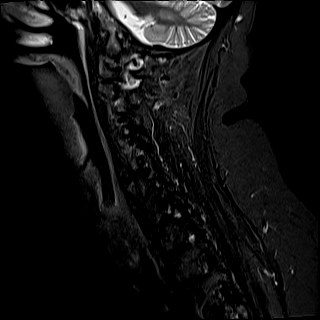
[im 15/15]
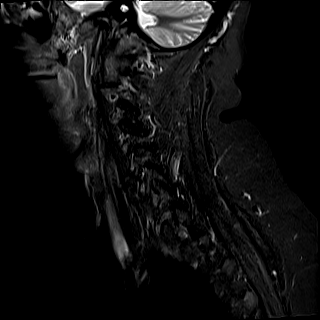

[Series 8: T2 · axial · 3.0mm · 0.70mm/px · z∈[-99,-1]mm · 8 of 32 slices shown (2 of 2)]
[im 1/32]
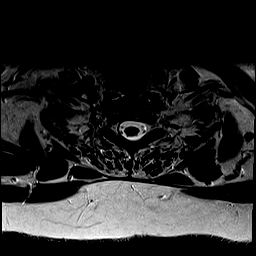
[im 5/32]
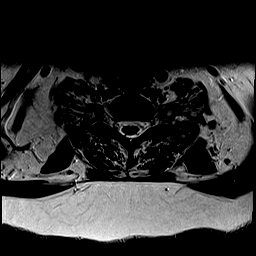
[im 10/32]
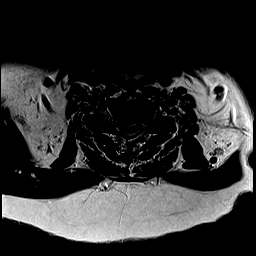
[im 15/32]
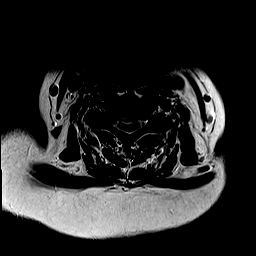
[im 17/32]
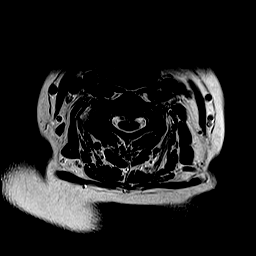
[im 22/32]
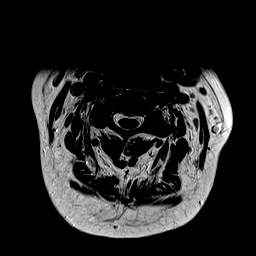
[im 27/32]
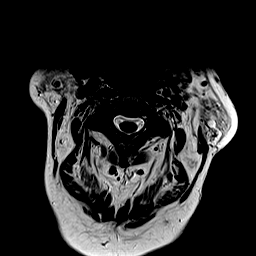
[im 32/32]
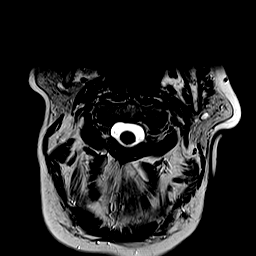

[Series 9: GRE · axial · 3.0mm · 0.35mm/px · z∈[-99,-17]mm · 7 of 32 slices shown]
[im 1/32]
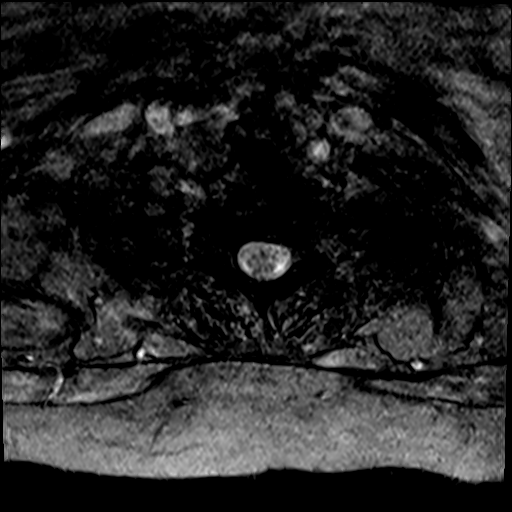
[im 5/32]
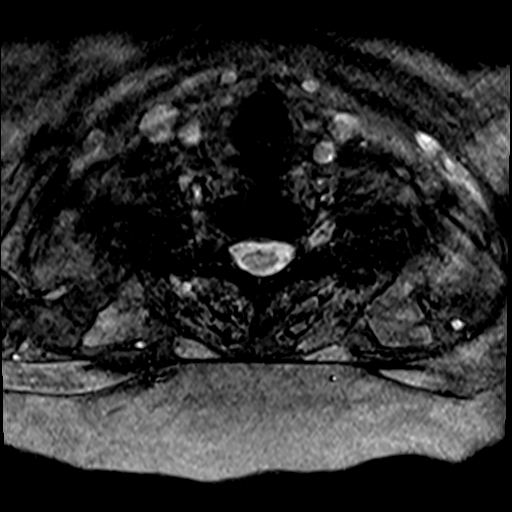
[im 10/32]
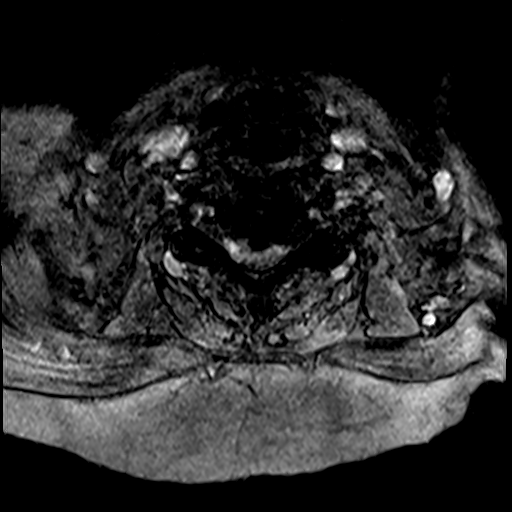
[im 15/32]
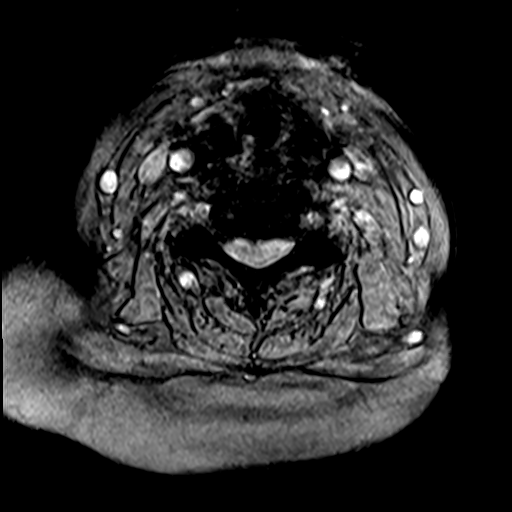
[im 17/32]
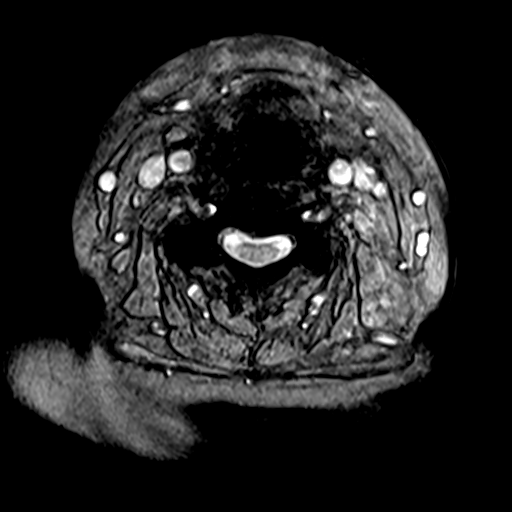
[im 22/32]
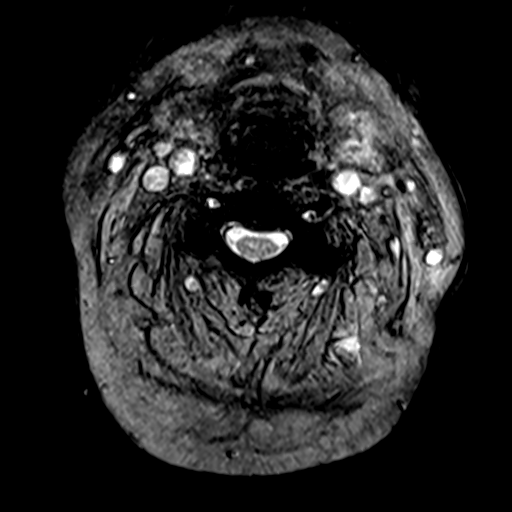
[im 27/32]
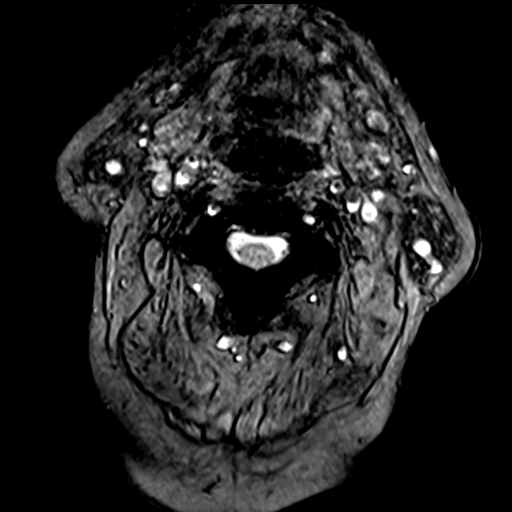

[35 of 48 positions shown; findings below may reference images not displayed]

FINDINGS: Alignment: Straightening with slight reversal of the normal cervical
lordosis. No listhesis.

Vertebrae: Vertebral body height maintained without acute or chronic
fracture. Bone marrow signal intensity diffusely heterogeneous
without discrete or worrisome osseous lesion. No abnormal marrow
edema.

Cord: Subtle patchy signal abnormality seen involving the cervical
spinal cord at the level of C5-6, suspicious for compressive
myelomalacia (series 7, image 9). Signal intensity within the
visualized cord otherwise within normal limits.

Posterior Fossa, vertebral arteries, paraspinal tissues:
Unremarkable.

Disc levels:

C2-C3: Negative interspace. Right greater than left facet
hypertrophy. No canal or foraminal stenosis.

C3-C4: Mild left eccentric disc bulge with uncovertebral spurring.
Mild to moderate left greater than right facet arthrosis. No spinal
stenosis. Moderate left C4 foraminal narrowing. Right neural foramen
remains patent.

C4-C5: Small central disc protrusion minimally indents the ventral
thecal sac (series 8, image 14). Severe right with mild-to-moderate
left facet arthrosis. No spinal stenosis. Mild right C5 foraminal
stenosis. Left neural foramen remains patent.

C5-C6: Degenerative intervertebral disc space narrowing with diffuse
disc osteophyte complex. Broad posterior component effaces the
ventral thecal sac, contacting and flattening the cervical cord.
Patchy cord signal abnormality suspicious for myelomalacia. Severe
spinal stenosis with the thecal sac measuring 5 mm in AP diameter at
its most narrow point. Severe right with moderate left C6 foraminal
narrowing.

C6-C7: Degenerative intervertebral disc space narrowing with diffuse
disc osteophyte complex. Right paracentral component indents the
ventral thecal sac with secondary flattening of the right hemicord
(series 8, image 24). No visible cord signal changes at this level.
Severe spinal stenosis with the thecal sac measuring 6 mm in AP
diameter at its most narrow point. Severe left with mild right C7
foraminal stenosis.

C7-T1: Negative interspace. Minimal facet hypertrophy on the left.
No canal or foraminal stenosis.

Visualized upper thoracic spine demonstrates no significant finding.
IMPRESSION: 1. Degenerative disc osteophyte at C5-6 with resultant severe spinal
stenosis, with severe right and moderate left C6 foraminal
narrowing. Patchy signal abnormality within the cervical spinal cord
at this level consistent with compressive myelomalacia.
2. Right paracentral disc osteophyte at C6-7 with secondary
flattening of the right hemi cord, but no visible cord signal
changes at this level. Associated severe left C7 foraminal stenosis.
3. Moderate left C4 foraminal stenosis related to uncovertebral and
facet disease.
4. Multilevel facet arthrosis throughout the cervical spine as
above, most pronounced at C4-5 on the right. Findings could
contribute to underlying neck pain.

These results will be called to the ordering clinician or
representative by the Radiologist Assistant, and communication
documented in the PACS or [REDACTED].

## 2022-10-21 ENCOUNTER — Other Ambulatory Visit: Payer: Self-pay | Admitting: Physician Assistant

## 2022-11-14 ENCOUNTER — Other Ambulatory Visit: Payer: Self-pay | Admitting: Cardiology

## 2022-12-04 ENCOUNTER — Telehealth: Payer: Self-pay | Admitting: Cardiology

## 2022-12-04 NOTE — Telephone Encounter (Signed)
Anita Kief, PA-C  Cc: [name], MD Stress test normal without ischemia, she can proceed with surgery and is ok to hold brilinta and aspirin. After surgery she can resume ASA 81 mg alone without brilinta per Dr. Diona Browner. Have copied surgeon, Dr. Hoyt Koch  Last read by Lorayne Bender at  4:56 PM on 07/12/2022.  Spoke to patient who stated she was unaware that she was to stop Brilinta after surgery. Pt stated she had taken it up until about 2 weeks ago. Pt informed that she does not have to continue medication.   Will route to provider as Lorain Childes

## 2022-12-04 NOTE — Telephone Encounter (Signed)
Pt c/o medication issue:  1. Name of Medication: BRILINTA 90 MG TABS tablet   2. How are you currently taking this medication (dosage and times per day)?    3. Are you having a reaction (difficulty breathing--STAT)? no  4. What is your medication issue? Patient calling to see why this medication is not listed under her current medication. Please advise

## 2022-12-26 ENCOUNTER — Encounter: Payer: Self-pay | Admitting: Cardiology

## 2022-12-26 ENCOUNTER — Ambulatory Visit: Payer: Medicaid Other | Attending: Cardiology | Admitting: Cardiology

## 2022-12-26 VITALS — BP 132/86 | HR 73 | Ht 67.5 in | Wt 191.8 lb

## 2022-12-26 DIAGNOSIS — I10 Essential (primary) hypertension: Secondary | ICD-10-CM | POA: Diagnosis not present

## 2022-12-26 DIAGNOSIS — I25119 Atherosclerotic heart disease of native coronary artery with unspecified angina pectoris: Secondary | ICD-10-CM

## 2022-12-26 NOTE — Progress Notes (Signed)
    Cardiology Office Note  Date: 12/26/2022   ID: Anita Michael, DOB 1966-07-22, MRN [id number]  History of Present Illness: Anita Michael is a 57 y.o. female last seen in December 2023 by Ms. Anita Michael.  She is here for a routine visit.  She does not report any increasing angina over baseline, has used 2 nitroglycerin tablets in the interim.  She reports compliance with her medications which I reviewed today.  She did undergo follow-up ischemic testing in December of last year which was reassuring.  Last LDL was 65 in May 2023, she remains on Lipitor 80 mg daily and has follow-up lab work pending per PCP.  She does not report any palpitations or syncope.  Physical Exam: VS:  BP 132/86   Pulse 73   Ht 5' 7.5" (1.715 m)   Wt 191 lb 12.8 oz (87 kg)   SpO2 96%   BMI 29.60 kg/m , BMI Body mass index is 29.6 kg/m.  Wt Readings from Last 3 Encounters:  12/26/22 191 lb 12.8 oz (87 kg)  08/06/22 180 lb 8.9 oz (81.9 kg)  06/26/22 180 lb (81.6 kg)    General: Patient appears comfortable at rest. HEENT: Conjunctiva and lids normal. Neck: Supple, no elevated JVP or carotid bruits. Lungs: Clear to auscultation, nonlabored breathing at rest. Cardiac: Regular rate and rhythm, no S3 or significant systolic murmur.  ECG:  An ECG dated 06/26/2022 was personally reviewed today and demonstrated:  Sinus rhythm with prolonged PR interval, right bundle branch block, PVC's.  Labwork: 06/26/2022: ALT 27; AST 21; BUN 7; Creatinine, Ser 0.79; Hemoglobin 15.1; Platelets 276; Potassium 4.2; Sodium 143     Component Value Date/Time   CHOL 131 11/26/2021 1211   TRIG 150 (H) 11/26/2021 1211   HDL 36 (L) 11/26/2021 1211   CHOLHDL 3.6 11/26/2021 1211   VLDL 30 11/26/2021 1211   LDLCALC 65 11/26/2021 1211   Other Studies Reviewed Today:  Echocardiogram 10/15/2021:  1. Left ventricular ejection fraction, by estimation, is 50 to 55%. The  left ventricle has low normal function. The left ventricle  demonstrates  regional wall motion abnormalities (see scoring diagram/findings for  description). Left ventricular diastolic   parameters are consistent with Grade I diastolic dysfunction (impaired  relaxation).   2. Right ventricular systolic function is normal. The right ventricular  size is normal.   3. The mitral valve is normal in structure. Mild mitral valve  regurgitation. No evidence of mitral stenosis.   4. The aortic valve is normal in structure. Aortic valve regurgitation is  not visualized. No aortic stenosis is present.   5. The inferior vena cava is normal in size with greater than 50%  respiratory variability, suggesting right atrial pressure of 3 mmHg.   Assessment and Plan:  1.  CAD status post NSTEMI in December 2022 managed with DES to the RCA.  Follow-up Lexiscan Myoview in December 2023 was low risk with no evidence of ischemia and LVEF 50%.  She reports no progressive angina or increasing nitroglycerin use.  Continue aspirin, Lipitor, Lopressor, and Cozaar.  2.  Mitral regurgitation, mild by echocardiogram in March 2023.  3.  Essential hypertension.  No changes to current regimen including Cozaar and Lopressor.  Disposition:  Follow up  6 months.  Signed, Jonelle Sidle, M.D., F.A.C.C. [facility]

## 2022-12-26 NOTE — Patient Instructions (Signed)
Medication Instructions:  Your physician recommends that you continue on your current medications as directed. Please refer to the Current Medication list given to you today.   Labwork: None today  Testing/Procedures: None today  Follow-Up: 6 months  Any Other Special Instructions Will Be Listed Below (If Applicable).  If you need a refill on your cardiac medications before your next appointment, please call your pharmacy.  

## 2023-01-28 ENCOUNTER — Other Ambulatory Visit: Payer: Self-pay | Admitting: Neurosurgery

## 2023-01-28 ENCOUNTER — Other Ambulatory Visit: Payer: Self-pay | Admitting: Student

## 2023-02-03 NOTE — Pre-Procedure Instructions (Signed)
Surgical Instructions    Your procedure is scheduled on Thursday, July 18th at 11:28 A.M.  Report to [facility] Main Entrance "A" at 9:30 A.M., then check in with the Admitting office.  Call this number if you have problems the morning of surgery:  [phone]  If you have any questions prior to your surgery date call [phone]: Open Monday-Friday 8am-4pm If you experience any cold or flu symptoms such as cough, fever, chills, shortness of breath, etc. between now and your scheduled surgery, please notify us at the above number.     Remember:  Do not eat or drink after midnight the night before your surgery    Take these medicines the morning of surgery with A SIP OF WATER  isosorbide mononitrate (IMDUR)   fluticasone (FLONASE)  cetirizine (ZYRTEC)  nitroGLYCERIN (NITROSTAT) -please let a nurse know if you had to use this.   Follow your surgeon's instructions on when to stop Aspirin.  If no instructions were given by your surgeon then you will need to call the office to get those instructions.    As of today, STOP taking any Aleve, Naproxen, Ibuprofen, Motrin, Advil, Goody's, BC's, all herbal medications, fish oil, and all vitamins.                     Do NOT Smoke (Tobacco/Vaping) for 24 hours prior to your procedure.  If you use a CPAP at night, you may bring your mask/headgear for your overnight stay.   Contacts, glasses, piercing's, hearing aid's, dentures or partials may not be worn into surgery, please bring cases for these belongings.    For patients admitted to the hospital, discharge time will be determined by your treatment team.   Patients discharged the day of surgery will not be allowed to drive home, and someone needs to stay with them for 24 hours.  SURGICAL WAITING ROOM VISITATION Patients having surgery or a procedure may have no more than 2 support people in the waiting area - these visitors may rotate.   Children under the age of 69 must have an adult  with them who is not the patient. If the patient needs to stay at the hospital during part of their recovery, the visitor guidelines for inpatient rooms apply. Pre-op nurse will coordinate an appropriate time for 1 support person to accompany patient in pre-op.  This support person may not rotate.   Please refer to the [facility] website for the visitor guidelines for Inpatients (after your surgery is over and you are in a regular room).  If you received a COVID test during your pre-op visit  it is requested that you wear a mask when out in public, stay away from anyone that may not be feeling well and notify your surgeon if you develop symptoms. If you have been in contact with anyone that has tested positive in the last 10 days please notify you surgeon.   Pre-operative 5 CHG Bath Instructions   You can play a key role in reducing the risk of infection after surgery. Your skin needs to be as free of germs as possible. You can reduce the number of germs on your skin by washing with CHG (chlorhexidine gluconate) soap before surgery. CHG is an antiseptic soap that kills germs and continues to kill germs even after washing.   DO NOT use if you have an allergy to chlorhexidine/CHG or antibacterial soaps. If your skin becomes reddened or irritated, stop using the CHG and notify one  of our RNs at [phone].   Please shower with the CHG soap starting 4 days before surgery using the following schedule:     Please keep in mind the following:  DO NOT shave, including legs and underarms, starting the day of your first shower.   You may shave your face at any point before/day of surgery.  Place clean sheets on your bed the day you start using CHG soap. Use a clean washcloth (not used since being washed) for each shower. DO NOT sleep with pets once you start using the CHG.   CHG Shower Instructions:  If you choose to wash your hair and private area, wash first with your normal shampoo/soap.  After  you use shampoo/soap, rinse your hair and body thoroughly to remove shampoo/soap residue.  Turn the water OFF and apply about 3 tablespoons (45 ml) of CHG soap to a CLEAN washcloth.  Apply CHG soap ONLY FROM YOUR NECK DOWN TO YOUR TOES (washing for 3-5 minutes)  DO NOT use CHG soap on face, private areas, open wounds, or sores.  Pay special attention to the area where your surgery is being performed.  If you are having back surgery, having someone wash your back for you may be helpful. Wait 2 minutes after CHG soap is applied, then you may rinse off the CHG soap.  Pat dry with a clean towel  Put on clean clothes/pajamas   If you choose to wear lotion, please use ONLY the CHG-compatible lotions on the back of this paper.     Additional instructions for the day of surgery: DO NOT APPLY any lotions, deodorants, cologne, or perfumes, jewelry or makeup.   Put on clean/comfortable clothes.  Brush your teeth.  Ask your nurse before applying any prescription medications to the skin. Do not bring valuables to the hospital. [facility] is not responsible for any belongings or valuables. Do not wear nail polish, gel polish, artificial nails, or any other type of covering on natural nails (fingers and toes)         CHG Compatible Lotions   Aveeno Moisturizing lotion  Cetaphil Moisturizing Cream  Cetaphil Moisturizing Lotion  Clairol Herbal Essence Moisturizing Lotion, Dry Skin  Clairol Herbal Essence Moisturizing Lotion, Extra Dry Skin  Clairol Herbal Essence Moisturizing Lotion, Normal Skin  Curel Age Defying Therapeutic Moisturizing Lotion with Alpha Hydroxy  Curel Extreme Care Body Lotion  Curel Soothing Hands Moisturizing Hand Lotion  Curel Therapeutic Moisturizing Cream, Fragrance-Free  Curel Therapeutic Moisturizing Lotion, Fragrance-Free  Curel Therapeutic Moisturizing Lotion, Original Formula  Eucerin Daily Replenishing Lotion  Eucerin Dry Skin Therapy Plus Alpha Hydroxy Crme   Eucerin Dry Skin Therapy Plus Alpha Hydroxy Lotion  Eucerin Original Crme  Eucerin Original Lotion  Eucerin Plus Crme Eucerin Plus Lotion  Eucerin TriLipid Replenishing Lotion  Keri Anti-Bacterial Hand Lotion  Keri Deep Conditioning Original Lotion Dry Skin Formula Softly Scented  Keri Deep Conditioning Original Lotion, Fragrance Free Sensitive Skin Formula  Keri Lotion Fast Absorbing Fragrance Free Sensitive Skin Formula  Keri Lotion Fast Absorbing Softly Scented Dry Skin Formula  Keri Original Lotion  Keri Skin Renewal Lotion Keri Silky Smooth Lotion  Keri Silky Smooth Sensitive Skin Lotion  Nivea Body Creamy Conditioning Oil  Nivea Body Extra Enriched Teacher, adult education Moisturizing Lotion Nivea Crme  Nivea Skin Firming Lotion  NutraDerm 30 Skin Lotion  NutraDerm Skin Lotion  NutraDerm Therapeutic Skin Cream  NutraDerm Therapeutic Skin Lotion  ProShield Protective Hand  Cream  Provon moisturizing lotion

## 2023-02-04 ENCOUNTER — Other Ambulatory Visit: Payer: Self-pay

## 2023-02-04 ENCOUNTER — Encounter (HOSPITAL_COMMUNITY)
Admission: RE | Admit: 2023-02-04 | Discharge: 2023-02-04 | Disposition: A | Discharge status: Home / Self Care | Payer: Medicaid Other | Source: Ambulatory Visit | Attending: Neurosurgery | Admitting: Neurosurgery

## 2023-02-04 ENCOUNTER — Encounter (HOSPITAL_COMMUNITY): Payer: Self-pay

## 2023-02-04 ENCOUNTER — Other Ambulatory Visit: Payer: Self-pay | Admitting: Neurosurgery

## 2023-02-04 VITALS — BP 153/87 | HR 84 | Temp 98.4°F | Resp 17 | Ht 68.0 in | Wt 190.7 lb

## 2023-02-04 DIAGNOSIS — Z01812 Encounter for preprocedural laboratory examination: Secondary | ICD-10-CM | POA: Insufficient documentation

## 2023-02-04 DIAGNOSIS — I10 Essential (primary) hypertension: Secondary | ICD-10-CM | POA: Insufficient documentation

## 2023-02-04 DIAGNOSIS — Z01818 Encounter for other preprocedural examination: Secondary | ICD-10-CM

## 2023-02-04 DIAGNOSIS — I252 Old myocardial infarction: Secondary | ICD-10-CM | POA: Diagnosis not present

## 2023-02-04 DIAGNOSIS — I2510 Atherosclerotic heart disease of native coronary artery without angina pectoris: Secondary | ICD-10-CM | POA: Insufficient documentation

## 2023-02-04 HISTORY — DX: Cerebral infarction, unspecified: I63.9

## 2023-02-04 LAB — BASIC METABOLIC PANEL
Anion gap: 7 (ref 5–15)
BUN: 10 mg/dL (ref 6–20)
CO2: 23 mmol/L (ref 22–32)
Calcium: 10 mg/dL (ref 8.9–10.3)
Chloride: 109 mmol/L (ref 98–111)
Creatinine, Ser: 0.82 mg/dL (ref 0.44–1.00)
GFR, Estimated: >60 mL/min (ref >60)
Glucose, Bld: 80 mg/dL (ref 70–99)
Potassium: 3.9 mmol/L (ref 3.5–5.1)
Sodium: 139 mmol/L (ref 135–145)

## 2023-02-04 LAB — CBC
HCT: 49.1 % — ABNORMAL HIGH (ref 36.0–46.0)
Hemoglobin: 14.9 g/dL (ref 12.0–15.0)
MCH: 26.8 pg (ref 26.0–34.0)
MCHC: 30.3 g/dL (ref 30.0–36.0)
MCV: 88.3 fL (ref 80.0–100.0)
Platelets: 249 10*3/uL (ref 150–400)
RBC: 5.56 MIL/uL — ABNORMAL HIGH (ref 3.87–5.11)
RDW: 15.2 % (ref 11.5–15.5)
WBC: 9 10*3/uL (ref 4.0–10.5)
nRBC: 0 % (ref 0.0–0.2)

## 2023-02-04 LAB — SURGICAL PCR SCREEN
MRSA, PCR: NEGATIVE
Staphylococcus aureus: NEGATIVE

## 2023-02-04 LAB — TYPE AND SCREEN
ABO/RH(D): O POS
Antibody Screen: NEGATIVE

## 2023-02-04 NOTE — Progress Notes (Signed)
PCP - [facility], [place] Cardiologist - Dr. Nona Dell  PPM/ICD - n/a  Chest x-ray - n/a EKG - 06/26/22 Stress Test - 01/09/22 ECHO - 10/15/21 Cardiac Cath - 07/06/21  Sleep Study - OSA+ CPAP - Can not tolerate CPAP  Blood Thinner Instructions: n/a Aspirin Instructions: LD 7/8  NPO at MD  COVID TEST- n/a  Anesthesia review: Yes, hx of CAD. Cardiac clearance received 12/04/22, note in EPIC.   Patient denies shortness of breath, fever, cough and chest pain at PAT appointment   All instructions explained to the patient, with a verbal understanding of the material. Patient agrees to go over the instructions while at home for a better understanding. Patient also instructed to self quarantine after being tested for COVID-19. The opportunity to ask questions was provided.

## 2023-02-05 NOTE — Progress Notes (Signed)
Anesthesia Chart Review:  Follows cardiology for history of HTN, mild mitral regurgitation, CAD s/p NSTEMI 06/2021 managed with DES to the RCA.  Lexiscan in December 2023 was low risk with no evidence of ischemia and LVEF 50%.  Last seen by Dr. Diona Browner on 12/26/2022. Stable at that time, no changes to management, 6 month followup recommended. She was cleared by Herma Carson, PA-C per telephone encounter 12/04/22, "Stress test normal without ischemia, she can proceed with surgery and is ok to hold brilinta and aspirin. After surgery she can resume ASA 81 mg alone without brilinta per Dr. Diona Browner. Have copied surgeon, Dr. Hoyt Koch."  Preop labs reviewed, unremarkable.   EKG 06/26/22: Sinus rhythm with 1st degree AV block with occasional PVCs. Rate 89. RBBB.  Nuclear stress 07/11/22:   No ST deviation was noted during pharmacological stress.   LV perfusion is abnormal. There is no evidence of ischemia or infarction. There is a small fixed defect with mild reduction in uptake present in the mid to basal anteroseptal location with normal wall motion in the defect area consistent with artifact caused by breast attenuation.   Left ventricular function is abnormal. Global function is mildly reduced, LVEF 50%. There were no regional wall motion abnormalities.   The study is normal. The study is low risk.  TTE 10/15/21:  1. Left ventricular ejection fraction, by estimation, is 50 to 55%. The  left ventricle has low normal function. The left ventricle demonstrates  regional wall motion abnormalities (see scoring diagram/findings for  description). Left ventricular diastolic   parameters are consistent with Grade I diastolic dysfunction (impaired  relaxation).   2. Right ventricular systolic function is normal. The right ventricular  size is normal.   3. The mitral valve is normal in structure. Mild mitral valve  regurgitation. No evidence of mitral stenosis.   4. The aortic valve is normal in  structure. Aortic valve regurgitation is  not visualized. No aortic stenosis is present.   5. The inferior vena cava is normal in size with greater than 50%  respiratory variability, suggesting right atrial pressure of 3 mmHg.   Cath and PCI 07/06/21:    1st Diag lesion is 50% stenosed.   1st Mrg lesion is 50% stenosed.   Mid RCA lesion is 99% stenosed.   A drug-eluting stent was successfully placed using a STENT ONYX FRONTIER [id number].   Post intervention, there is a 0% residual stenosis.   1.  Severe single-vessel coronary artery disease with critical stenosis of the mid RCA, treated successfully with PCI using a 3.5 x 18 mm resolute Onyx DES 2.  Mild nonobstructive diagonal and obtuse marginal stenoses 3.  Mildly elevated LVEDP   Recommend: Continue cangrelor x2 hours then stop, check 2D echocardiogram, post MI medical therapy.  Dual antiplatelet therapy with aspirin and ticagrelor x12 months without interruption.  Medical therapy for mild residual coronary artery disease.    Zannie Cove [facility] Short Stay Center/Anesthesiology Phone [phone] 02/05/2023 10:00 AM

## 2023-02-05 NOTE — Anesthesia Preprocedure Evaluation (Addendum)
Anesthesia Evaluation  Patient identified by MRN, date of birth, ID band Patient awake    Reviewed: Allergy & Precautions, NPO status , Patient's Chart, lab work & pertinent test results  History of Anesthesia Complications Negative for: history of anesthetic complications  Airway Mallampati: III  TM Distance: >3 FB Neck ROM: Limited    Dental  (+) Edentulous Lower, Edentulous Upper   Pulmonary sleep apnea , Current Smoker and Patient abstained from smoking.   Pulmonary exam normal        Cardiovascular hypertension, Pt. on medications + CAD, + Past MI and + Cardiac Stents  Normal cardiovascular exam+ Valvular Problems/Murmurs MR    '23 Pharm Stress -   No ST deviation was noted during pharmacological stress.   LV perfusion is abnormal. There is no evidence of ischemia or infarction. There is a small fixed defect with mild reduction in uptake present in the mid to basal anteroseptal location with normal wall motion in the defect area consistent with artifact caused by breast attenuation.   Left ventricular function is abnormal. Global function is mildly reduced, LVEF 50%. There were no regional wall motion abnormalities.   The study is normal. The study is low risk.  '23 TTE - EF 50 to 55%. Grade I diastolic dysfunction (impaired relaxation). Mild mitral valve regurgitation.     Neuro/Psych  Headaches PSYCHIATRIC DISORDERS Anxiety      Neuromuscular disease CVA, No Residual Symptoms    GI/Hepatic negative GI ROS, Neg liver ROS,,,  Endo/Other  negative endocrine ROS    Renal/GU Renal disease     Musculoskeletal  (+)  Fibromyalgia -  Abdominal   Peds  Hematology  Polycythemia    Anesthesia Other Findings   Reproductive/Obstetrics                             Anesthesia Physical Anesthesia Plan  ASA: 3  Anesthesia Plan: General   Post-op Pain Management: Tylenol PO (pre-op)* and  Celebrex PO (pre-op)*   Induction: Intravenous  PONV Risk Score and Plan: 2 and Treatment may vary due to age or medical condition, Ondansetron, Dexamethasone and Midazolam  Airway Management Planned: Oral ETT and Video Laryngoscope Planned  Additional Equipment: None  Intra-op Plan:   Post-operative Plan: Extubation in OR  Informed Consent: I have reviewed the patients History and Physical, chart, labs and discussed the procedure including the risks, benefits and alternatives for the proposed anesthesia with the patient or authorized representative who has indicated his/her understanding and acceptance.     Dental advisory given  Plan Discussed with: CRNA and Anesthesiologist  Anesthesia Plan Comments: (See PAT note )        Anesthesia Quick Evaluation

## 2023-02-06 ENCOUNTER — Ambulatory Visit (HOSPITAL_COMMUNITY): Payer: Medicaid Other | Admitting: Physician Assistant

## 2023-02-06 ENCOUNTER — Ambulatory Visit (HOSPITAL_BASED_OUTPATIENT_CLINIC_OR_DEPARTMENT_OTHER): Payer: Medicaid Other | Admitting: Anesthesiology

## 2023-02-06 ENCOUNTER — Other Ambulatory Visit: Payer: Self-pay

## 2023-02-06 ENCOUNTER — Encounter (HOSPITAL_COMMUNITY): Payer: Self-pay

## 2023-02-06 ENCOUNTER — Encounter (HOSPITAL_COMMUNITY)
Admission: RE | Disposition: A | Discharge status: Home / Self Care | Payer: Self-pay | Source: Home / Self Care | Attending: Neurosurgery

## 2023-02-06 ENCOUNTER — Ambulatory Visit (HOSPITAL_COMMUNITY)
Admission: RE | Admit: 2023-02-06 | Discharge: 2023-02-06 | Disposition: A | Discharge status: Home / Self Care | Payer: Medicaid Other | Attending: Neurosurgery | Admitting: Neurosurgery

## 2023-02-06 ENCOUNTER — Ambulatory Visit (HOSPITAL_COMMUNITY): Payer: Medicaid Other

## 2023-02-06 DIAGNOSIS — I252 Old myocardial infarction: Secondary | ICD-10-CM | POA: Insufficient documentation

## 2023-02-06 DIAGNOSIS — F17210 Nicotine dependence, cigarettes, uncomplicated: Secondary | ICD-10-CM | POA: Insufficient documentation

## 2023-02-06 DIAGNOSIS — I2510 Atherosclerotic heart disease of native coronary artery without angina pectoris: Secondary | ICD-10-CM | POA: Diagnosis not present

## 2023-02-06 DIAGNOSIS — M797 Fibromyalgia: Secondary | ICD-10-CM | POA: Diagnosis not present

## 2023-02-06 DIAGNOSIS — M4802 Spinal stenosis, cervical region: Secondary | ICD-10-CM | POA: Insufficient documentation

## 2023-02-06 DIAGNOSIS — Z8673 Personal history of transient ischemic attack (TIA), and cerebral infarction without residual deficits: Secondary | ICD-10-CM | POA: Insufficient documentation

## 2023-02-06 DIAGNOSIS — G4733 Obstructive sleep apnea (adult) (pediatric): Secondary | ICD-10-CM | POA: Insufficient documentation

## 2023-02-06 DIAGNOSIS — G992 Myelopathy in diseases classified elsewhere: Secondary | ICD-10-CM | POA: Insufficient documentation

## 2023-02-06 DIAGNOSIS — Z955 Presence of coronary angioplasty implant and graft: Secondary | ICD-10-CM | POA: Insufficient documentation

## 2023-02-06 DIAGNOSIS — I10 Essential (primary) hypertension: Secondary | ICD-10-CM | POA: Insufficient documentation

## 2023-02-06 DIAGNOSIS — Z09 Encounter for follow-up examination after completed treatment for conditions other than malignant neoplasm: Secondary | ICD-10-CM | POA: Diagnosis not present

## 2023-02-06 HISTORY — PX: ANTERIOR CERVICAL DECOMP/DISCECTOMY FUSION: SHX1161

## 2023-02-06 LAB — ABO/RH: ABO/RH(D): O POS

## 2023-02-06 SURGERY — ANTERIOR CERVICAL DECOMPRESSION/DISCECTOMY FUSION 2 LEVELS
Anesthesia: General | Site: Spine Cervical

## 2023-02-06 MED ORDER — CYCLOBENZAPRINE HCL 10 MG PO TABS
ORAL_TABLET | ORAL | Status: AC
  Filled 2023-02-06: qty 1

## 2023-02-06 MED ORDER — BUPIVACAINE HCL (PF) 0.5 % IJ SOLN
INTRAMUSCULAR | Status: AC
  Filled 2023-02-06: qty 30

## 2023-02-06 MED ORDER — ONDANSETRON HCL 4 MG/2ML IJ SOLN
INTRAMUSCULAR | Status: AC
  Filled 2023-02-06: qty 2

## 2023-02-06 MED ORDER — MIDAZOLAM HCL 2 MG/2ML IJ SOLN
INTRAMUSCULAR | Status: DC | PRN
  Administered 2023-02-06: 2 mg via INTRAVENOUS

## 2023-02-06 MED ORDER — PHENYLEPHRINE 80 MCG/ML (10ML) SYRINGE FOR IV PUSH (FOR BLOOD PRESSURE SUPPORT)
PREFILLED_SYRINGE | INTRAVENOUS | Status: AC
  Filled 2023-02-06: qty 10

## 2023-02-06 MED ORDER — PROPOFOL 10 MG/ML IV BOLUS
INTRAVENOUS | Status: DC | PRN
Start: 2023-02-06 — End: 2023-02-06
  Administered 2023-02-06: 150 mg via INTRAVENOUS

## 2023-02-06 MED ORDER — CYCLOBENZAPRINE HCL 10 MG PO TABS
10.0000 mg | ORAL_TABLET | Freq: Once | ORAL | Status: AC
  Administered 2023-02-06: 10 mg via ORAL

## 2023-02-06 MED ORDER — ONDANSETRON HCL 4 MG/2ML IJ SOLN
INTRAMUSCULAR | Status: DC | PRN
  Administered 2023-02-06: 4 mg via INTRAVENOUS

## 2023-02-06 MED ORDER — ACETAMINOPHEN 500 MG PO TABS
1000.0000 mg | ORAL_TABLET | Freq: Once | ORAL | Status: AC
  Administered 2023-02-06: 1000 mg via ORAL

## 2023-02-06 MED ORDER — LACTATED RINGERS IV SOLN: INTRAVENOUS | Status: DC | PRN

## 2023-02-06 MED ORDER — THROMBIN 5000 UNITS EX SOLR: OROMUCOSAL | Status: DC | PRN

## 2023-02-06 MED ORDER — FENTANYL CITRATE (PF) 100 MCG/2ML IJ SOLN
INTRAMUSCULAR | Status: AC
  Filled 2023-02-06: qty 2

## 2023-02-06 MED ORDER — EPHEDRINE SULFATE-NACL 50-0.9 MG/10ML-% IV SOSY
PREFILLED_SYRINGE | INTRAVENOUS | Status: DC | PRN
  Administered 2023-02-06: 5 mg via INTRAVENOUS

## 2023-02-06 MED ORDER — BUPIVACAINE HCL 0.5 % IJ SOLN
INTRAMUSCULAR | Status: DC | PRN
  Administered 2023-02-06: 3 mL

## 2023-02-06 MED ORDER — DOCUSATE SODIUM 100 MG PO CAPS: 100.0000 mg | ORAL_CAPSULE | Freq: Two times a day (BID) | ORAL | 2 refills | Status: DC

## 2023-02-06 MED ORDER — CHLORHEXIDINE GLUCONATE CLOTH 2 % EX PADS: 6.0000 | MEDICATED_PAD | Freq: Once | CUTANEOUS | Status: DC

## 2023-02-06 MED ORDER — ROCURONIUM BROMIDE 10 MG/ML (PF) SYRINGE
PREFILLED_SYRINGE | INTRAVENOUS | Status: AC
  Filled 2023-02-06: qty 10

## 2023-02-06 MED ORDER — FENTANYL CITRATE (PF) 100 MCG/2ML IJ SOLN
25.0000 ug | INTRAMUSCULAR | Status: DC | PRN
  Administered 2023-02-06: 25 ug via INTRAVENOUS
  Administered 2023-02-06: 50 ug via INTRAVENOUS
  Administered 2023-02-06: 25 ug via INTRAVENOUS

## 2023-02-06 MED ORDER — PROMETHAZINE HCL 25 MG/ML IJ SOLN: 6.2500 mg | INTRAMUSCULAR | Status: DC | PRN

## 2023-02-06 MED ORDER — MIDAZOLAM HCL 2 MG/2ML IJ SOLN
INTRAMUSCULAR | Status: AC
  Filled 2023-02-06: qty 2

## 2023-02-06 MED ORDER — CEFAZOLIN SODIUM-DEXTROSE 2-4 GM/100ML-% IV SOLN
INTRAVENOUS | Status: AC
  Filled 2023-02-06: qty 100

## 2023-02-06 MED ORDER — ORAL CARE MOUTH RINSE: 15.0000 mL | Freq: Once | OROMUCOSAL | Status: AC

## 2023-02-06 MED ORDER — SUGAMMADEX SODIUM 200 MG/2ML IV SOLN
INTRAVENOUS | Status: DC | PRN
  Administered 2023-02-06 (×2): 200 mg via INTRAVENOUS

## 2023-02-06 MED ORDER — OXYCODONE HCL 5 MG/5ML PO SOLN: 5.0000 mg | Freq: Once | ORAL | Status: AC | PRN

## 2023-02-06 MED ORDER — PROPOFOL 10 MG/ML IV BOLUS
INTRAVENOUS | Status: AC
  Filled 2023-02-06: qty 20

## 2023-02-06 MED ORDER — CYCLOBENZAPRINE HCL 10 MG PO TABS: 10.0000 mg | ORAL_TABLET | Freq: Three times a day (TID) | ORAL | 2 refills | Status: DC | PRN

## 2023-02-06 MED ORDER — DEXAMETHASONE SODIUM PHOSPHATE 10 MG/ML IJ SOLN
INTRAMUSCULAR | Status: AC
  Filled 2023-02-06: qty 1

## 2023-02-06 MED ORDER — OXYCODONE-ACETAMINOPHEN 5-325 MG PO TABS: 1.0000 | ORAL_TABLET | Freq: Four times a day (QID) | ORAL | 0 refills | Status: DC | PRN

## 2023-02-06 MED ORDER — OXYCODONE HCL 5 MG PO TABS
5.0000 mg | ORAL_TABLET | Freq: Once | ORAL | Status: AC | PRN
  Administered 2023-02-06: 5 mg via ORAL

## 2023-02-06 MED ORDER — CELECOXIB 200 MG PO CAPS
200.0000 mg | ORAL_CAPSULE | Freq: Once | ORAL | Status: AC
  Administered 2023-02-06: 200 mg via ORAL

## 2023-02-06 MED ORDER — PHENYLEPHRINE 80 MCG/ML (10ML) SYRINGE FOR IV PUSH (FOR BLOOD PRESSURE SUPPORT)
PREFILLED_SYRINGE | INTRAVENOUS | Status: DC | PRN
  Administered 2023-02-06: 160 ug via INTRAVENOUS
  Administered 2023-02-06: 80 ug via INTRAVENOUS
  Administered 2023-02-06: 160 ug via INTRAVENOUS
  Administered 2023-02-06: 80 ug via INTRAVENOUS

## 2023-02-06 MED ORDER — CELECOXIB 200 MG PO CAPS
ORAL_CAPSULE | ORAL | Status: AC
  Filled 2023-02-06: qty 1

## 2023-02-06 MED ORDER — DEXAMETHASONE SODIUM PHOSPHATE 10 MG/ML IJ SOLN
INTRAMUSCULAR | Status: DC | PRN
  Administered 2023-02-06: 10 mg via INTRAVENOUS

## 2023-02-06 MED ORDER — ROCURONIUM BROMIDE 10 MG/ML (PF) SYRINGE
PREFILLED_SYRINGE | INTRAVENOUS | Status: DC | PRN
  Administered 2023-02-06: 20 mg via INTRAVENOUS
  Administered 2023-02-06: 60 mg via INTRAVENOUS
  Administered 2023-02-06: 20 mg via INTRAVENOUS

## 2023-02-06 MED ORDER — 0.9 % SODIUM CHLORIDE (POUR BTL) OPTIME
TOPICAL | Status: DC | PRN
  Administered 2023-02-06: 1000 mL

## 2023-02-06 MED ORDER — FENTANYL CITRATE (PF) 250 MCG/5ML IJ SOLN
INTRAMUSCULAR | Status: AC
  Filled 2023-02-06: qty 5

## 2023-02-06 MED ORDER — CHLORHEXIDINE GLUCONATE 0.12 % MT SOLN
OROMUCOSAL | Status: AC
  Filled 2023-02-06: qty 15

## 2023-02-06 MED ORDER — FENTANYL CITRATE (PF) 250 MCG/5ML IJ SOLN
INTRAMUSCULAR | Status: DC | PRN
  Administered 2023-02-06: 50 ug via INTRAVENOUS
  Administered 2023-02-06: 100 ug via INTRAVENOUS
  Administered 2023-02-06 (×2): 50 ug via INTRAVENOUS

## 2023-02-06 MED ORDER — ACETAMINOPHEN 500 MG PO TABS
ORAL_TABLET | ORAL | Status: AC
  Filled 2023-02-06: qty 2

## 2023-02-06 MED ORDER — THROMBIN 5000 UNITS EX SOLR
CUTANEOUS | Status: AC
  Filled 2023-02-06: qty 5000

## 2023-02-06 MED ORDER — LACTATED RINGERS IV SOLN: INTRAVENOUS | Status: DC

## 2023-02-06 MED ORDER — OXYCODONE HCL 5 MG PO TABS
ORAL_TABLET | ORAL | Status: AC
  Filled 2023-02-06: qty 1

## 2023-02-06 MED ORDER — LIDOCAINE 2% (20 MG/ML) 5 ML SYRINGE
INTRAMUSCULAR | Status: DC | PRN
  Administered 2023-02-06: 60 mg via INTRAVENOUS

## 2023-02-06 MED ORDER — PHENYLEPHRINE HCL-NACL 20-0.9 MG/250ML-% IV SOLN
INTRAVENOUS | Status: DC | PRN
  Administered 2023-02-06: 20 ug/min via INTRAVENOUS

## 2023-02-06 MED ORDER — SUGAMMADEX SODIUM 200 MG/2ML IV SOLN: INTRAVENOUS | Status: DC | PRN

## 2023-02-06 MED ORDER — LIDOCAINE 2% (20 MG/ML) 5 ML SYRINGE
INTRAMUSCULAR | Status: AC
  Filled 2023-02-06: qty 5

## 2023-02-06 MED ORDER — PHENYLEPHRINE HCL-NACL 20-0.9 MG/250ML-% IV SOLN
INTRAVENOUS | Status: AC
  Filled 2023-02-06: qty 250

## 2023-02-06 MED ORDER — LIDOCAINE-EPINEPHRINE 1 %-1:100000 IJ SOLN
INTRAMUSCULAR | Status: AC
  Filled 2023-02-06: qty 1

## 2023-02-06 MED ORDER — CHLORHEXIDINE GLUCONATE 0.12 % MT SOLN
15.0000 mL | Freq: Once | OROMUCOSAL | Status: AC
  Administered 2023-02-06: 15 mL via OROMUCOSAL

## 2023-02-06 MED ORDER — LIDOCAINE-EPINEPHRINE 1 %-1:100000 IJ SOLN
INTRAMUSCULAR | Status: DC | PRN
  Administered 2023-02-06: 3 mL

## 2023-02-06 MED ORDER — CEFAZOLIN SODIUM-DEXTROSE 2-4 GM/100ML-% IV SOLN
2.0000 g | INTRAVENOUS | Status: AC
  Administered 2023-02-06: 2 g via INTRAVENOUS

## 2023-02-06 SURGICAL SUPPLY — 64 items
APL SKNCLS STERI-STRIP NONHPOA (GAUZE/BANDAGES/DRESSINGS) ×1
BAG COUNTER SPONGE SURGICOUNT (BAG) ×1 IMPLANT
BAG SPNG CNTER NS LX DISP (BAG) ×1
BASKET BONE COLLECTION (BASKET) IMPLANT
BENZOIN TINCTURE PRP APPL 2/3 (GAUZE/BANDAGES/DRESSINGS) ×1 IMPLANT
BIT DRILL 13 (BIT) IMPLANT
BIT DRILL NEURO 2X3.1 SFT TUCH (MISCELLANEOUS) ×1 IMPLANT
BLADE CLIPPER SURG (BLADE) IMPLANT
BUR CARBIDE MATCH 3.0 (BURR) IMPLANT
BUR MATCHSTICK NEURO 3.0 LAGG (BURR) ×1 IMPLANT
CANISTER SUCT 3000ML PPV (MISCELLANEOUS) ×1 IMPLANT
DEVICE ENDSKLTN IMPL 16X14X7X6 (Cage) IMPLANT
DRAPE C-ARM 42X72 X-RAY (DRAPES) ×2 IMPLANT
DRAPE HALF SHEET 40X57 (DRAPES) IMPLANT
DRAPE LAPAROTOMY 100X72 PEDS (DRAPES) ×1 IMPLANT
DRAPE MICROSCOPE SLANT 54X150 (MISCELLANEOUS) ×1 IMPLANT
DRESSING MEPILEX FLEX 4X4 (GAUZE/BANDAGES/DRESSINGS) ×1 IMPLANT
DRILL NEURO 2X3.1 SOFT TOUCH (MISCELLANEOUS) ×2
DRSG MEPILEX FLEX 4X4 (GAUZE/BANDAGES/DRESSINGS) ×1
DRSG OPSITE 4X5.5 SM (GAUZE/BANDAGES/DRESSINGS) ×2 IMPLANT
DRSG OPSITE POSTOP 4X6 (GAUZE/BANDAGES/DRESSINGS) IMPLANT
DURAPREP 26ML APPLICATOR (WOUND CARE) ×1 IMPLANT
ELECT COATED BLADE 2.86 ST (ELECTRODE) ×1 IMPLANT
ELECT REM PT RETURN 9FT ADLT (ELECTROSURGICAL) ×1
ELECTRODE REM PT RTRN 9FT ADLT (ELECTROSURGICAL) ×1 IMPLANT
ENDOSKELETON IMPLANT 16X14X7X6 (Cage) ×2 IMPLANT
GAUZE 4X4 16PLY ~~LOC~~+RFID DBL (SPONGE) IMPLANT
GLOVE BIO SURGEON STRL SZ7 (GLOVE) ×2 IMPLANT
GLOVE BIOGEL PI IND STRL 7.5 (GLOVE) ×2 IMPLANT
GLOVE ECLIPSE 7.5 STRL STRAW (GLOVE) ×1 IMPLANT
GOWN STRL REUS W/ TWL LRG LVL3 (GOWN DISPOSABLE) ×2 IMPLANT
GOWN STRL REUS W/ TWL XL LVL3 (GOWN DISPOSABLE) ×1 IMPLANT
GOWN STRL REUS W/TWL 2XL LVL3 (GOWN DISPOSABLE) IMPLANT
GOWN STRL REUS W/TWL LRG LVL3 (GOWN DISPOSABLE) ×2
GOWN STRL REUS W/TWL XL LVL3 (GOWN DISPOSABLE) ×1
HEMOSTAT POWDER KIT SURGIFOAM (HEMOSTASIS) ×1 IMPLANT
KIT BASIN OR (CUSTOM PROCEDURE TRAY) ×1 IMPLANT
KIT TURNOVER KIT B (KITS) ×1 IMPLANT
NDL HYPO 22X1.5 SAFETY MO (MISCELLANEOUS) ×1 IMPLANT
NDL SPNL 22GX3.5 QUINCKE BK (NEEDLE) ×1 IMPLANT
NEEDLE HYPO 22X1.5 SAFETY MO (MISCELLANEOUS) ×1 IMPLANT
NEEDLE SPNL 22GX3.5 QUINCKE BK (NEEDLE) ×1 IMPLANT
NS IRRIG 1000ML POUR BTL (IV SOLUTION) ×1 IMPLANT
PACK LAMINECTOMY NEURO (CUSTOM PROCEDURE TRAY) ×1 IMPLANT
PAD ARMBOARD 7.5X6 YLW CONV (MISCELLANEOUS) ×3 IMPLANT
PIN DISTRACTION 14MM (PIN) IMPLANT
PLATE 39MM (Plate) IMPLANT
PUTTY DBF 1CC CORTICAL FIBERS (Putty) IMPLANT
SCREW 3.5 SELFDRILL 15MM VARI (Screw) IMPLANT
SOL ELECTROSURG ANTI STICK (MISCELLANEOUS)
SOLUTION ELECTROSURG ANTI STCK (MISCELLANEOUS) ×1 IMPLANT
SPIKE FLUID TRANSFER (MISCELLANEOUS) ×1 IMPLANT
SPONGE INTESTINAL PEANUT (DISPOSABLE) ×1 IMPLANT
STAPLER VISISTAT 35W (STAPLE) IMPLANT
STRIP CLOSURE SKIN 1/2X4 (GAUZE/BANDAGES/DRESSINGS) ×1 IMPLANT
SUT MNCRL AB 4-0 PS2 18 (SUTURE) ×1 IMPLANT
SUT SILK 0 TIES 10X30 (SUTURE) IMPLANT
SUT SILK 2 0 TIES 10X30 (SUTURE) IMPLANT
SUT VIC AB 3-0 SH 8-18 (SUTURE) ×1 IMPLANT
TAPE CLOTH 3X10 TAN LF (GAUZE/BANDAGES/DRESSINGS) ×1 IMPLANT
TIP KERRISON THIN FOOTPLATE 2M (MISCELLANEOUS) IMPLANT
TOWEL GREEN STERILE (TOWEL DISPOSABLE) ×1 IMPLANT
TOWEL GREEN STERILE FF (TOWEL DISPOSABLE) ×1 IMPLANT
WATER STERILE IRR 1000ML POUR (IV SOLUTION) ×1 IMPLANT

## 2023-02-06 NOTE — Anesthesia Procedure Notes (Addendum)
Procedure Name: Intubation Date/Time: 02/06/2023 11:53 AM  Performed by: Darryl Nestle, CRNAPre-anesthesia Checklist: Patient identified, Emergency Drugs available, Suction available and Patient being monitored Patient Re-evaluated:Patient Re-evaluated prior to induction Oxygen Delivery Method: Circle system utilized Preoxygenation: Pre-oxygenation with 100% oxygen Induction Type: IV induction Ventilation: Mask ventilation without difficulty Laryngoscope Size: Glidescope and 3 Grade View: Grade I Tube type: Oral Tube size: 7.0 mm Number of attempts: 1 Airway Equipment and Method: Stylet and Oral airway Placement Confirmation: ETT inserted through vocal cords under direct vision, positive ETCO2 and breath sounds checked- equal and bilateral Secured at: 23 cm Tube secured with: Tape Dental Injury: Teeth and Oropharynx as per pre-operative assessment  Comments: By Morrie Sheldon, SRNA. Elected glidescope intubation for ACDF surgery

## 2023-02-06 NOTE — Anesthesia Postprocedure Evaluation (Signed)
Anesthesia Post Note  Patient: Anita Michael  Procedure(s) Performed: Cervical Five-Cervical Six, Cervical Six-Cervical Seven. Anterior Cervical Decompression/Discectomy Fusion (Spine Cervical)     Patient location during evaluation: PACU Anesthesia Type: General Level of consciousness: awake and alert Pain management: pain level controlled Vital Signs Assessment: post-procedure vital signs reviewed and stable Respiratory status: spontaneous breathing, nonlabored ventilation, respiratory function stable and patient connected to nasal cannula oxygen Cardiovascular status: blood pressure returned to baseline and stable Postop Assessment: no apparent nausea or vomiting Anesthetic complications: no   No notable events documented.  Last Vitals:  Vitals:   02/06/23 1515 02/06/23 1530  BP: (!) 104/53 112/67  Pulse: 64   Resp: 14   Temp:    SpO2: 92%     Last Pain:  Vitals:   02/06/23 1530  PainSc: Asleep                 Beryle Lathe

## 2023-02-06 NOTE — Progress Notes (Signed)
Orthopedic Tech Progress Note Patient Details:  Anita Michael 01/26/1966 [id number]  Ortho Devices Type of Ortho Device: Soft collar Ortho Device/Splint Location: Neck Ortho Device/Splint Interventions: Ordered, Application, Adjustment   Post Interventions Patient Tolerated: Well  Coden Franchi A Gerhard Rappaport 02/06/2023, 3:45 PM

## 2023-02-06 NOTE — Transfer of Care (Signed)
Immediate Anesthesia Transfer of Care Note  Patient: Anita Michael  Procedure(s) Performed: Cervical Five-Cervical Six, Cervical Six-Cervical Seven. Anterior Cervical Decompression/Discectomy Fusion (Spine Cervical)  Patient Location: PACU  Anesthesia Type:General  Level of Consciousness: awake, alert , and oriented  Airway & Oxygen Therapy: Patient Spontanous Breathing and Patient connected to face mask oxygen  Post-op Assessment: Report given to RN and Post -op Vital signs reviewed and stable  Post vital signs: Reviewed and stable  Last Vitals:  Vitals Value Taken Time  BP 100/58 02/06/23 1500  Temp 98   Pulse 68 02/06/23 1504  Resp 18 02/06/23 1504  SpO2 91 % 02/06/23 1504  Vitals shown include unfiled device data.  Last Pain:  Vitals:   02/06/23 1500  PainSc: 9       Patients Stated Pain Goal: 3 (02/06/23 0953)  Complications: No notable events documented.

## 2023-02-06 NOTE — H&P (Signed)
CC: myelopathy  HPI:     Patient is a 57 y.o. female presents with myelopathy symptoms of gait difficulty, dexterity loss, and bilateral UE pain which was progressive.  She is here for elective ACDF surgery.    Patient Active Problem List   Diagnosis Date Noted   Hemorrhoids 08/21/2021   Constipation 08/21/2021   Rectocele 08/21/2021   SUI (stress urinary incontinence, female) 08/21/2021   Vaginal pain 08/21/2021   PVC's (premature ventricular contractions) 07/07/2021   Tobacco use 07/06/2021   Mitral valve disorder 07/06/2021   NSTEMI (non-ST elevated myocardial infarction) (HCC) 07/06/2021   Other seasonal allergic rhinitis 05/14/2021   Claustrophobia 09/26/2020   Atrophic vaginitis 09/13/2020   Family history of muscular dystrophy 09/13/2020   Fibromyalgia 09/13/2020   Hypertensive disorder 09/13/2020   Irregular heart beat 09/13/2020   Mixed hyperlipidemia 09/13/2020   Nephrosclerosis 09/13/2020   Obesity with body mass index 30 or greater 09/13/2020   Sinusitis 09/13/2020   Vitamin D deficiency 09/13/2020   Difficulty with CPAP full face mask use 06/06/2020   Hepatic steatosis 06/06/2020   Blurry vision 06/03/2020   Difficulty walking 06/03/2020   Dizziness 06/03/2020   Headache disorder 06/03/2020   Obstructive sleep apnea syndrome 03/28/2020   Complex renal cyst 03/16/2020   Tubulovillous adenoma of colon 12/14/2019   Blood on toilet paper 11/11/2019   Encounter for screening colonoscopy 11/11/2019   Hematochezia 11/11/2019   Family history of cancer 10/19/2019   Polycythemia 10/19/2019   Facial weakness 02/16/2019   Migraine 02/16/2019   Past Medical History:  Diagnosis Date   CAD (coronary artery disease)    a. s/p NSTEMI in 06/2021 with DES to mid-RCA. Residual disease along D1 and 1st Mrg with medical management recommended.   Essential hypertension    Fibromyalgia    Generalized headaches    History of stroke    Noted incidentally by brain MRI  July 2022   Mitral regurgitation    a. moderate to severe by echo in 06/2021   Polycythemia    PVC's (premature ventricular contractions)    Renal insufficiency    Sleep apnea    Stroke [facility])     Past Surgical History:  Procedure Laterality Date   ANKLE SURGERY Right    as a child   APPENDECTOMY     BREAST BIOPSY Right 2015   Fibroadenoma   CHOLECYSTECTOMY     CORONARY STENT INTERVENTION N/A 07/06/2021   Procedure: CORONARY STENT INTERVENTION;  Surgeon: Tonny Bollman, MD;  Location[facility] INVASIVE CV LAB;  Service: Cardiovascular;  Laterality: N/A;   DRUG INDUCED ENDOSCOPY Bilateral 08/06/2022   Procedure: DRUG INDUCED ENDOSCOPY;  Surgeon: Christia Reading, MD;  Location: [facility];  Service: ENT;  Laterality: Bilateral;   LEFT HEART CATH AND CORONARY ANGIOGRAPHY N/A 07/06/2021   Procedure: LEFT HEART CATH AND CORONARY ANGIOGRAPHY;  Surgeon: Tonny Bollman, MD;  Location: [facility] INVASIVE CV LAB;  Service: Cardiovascular;  Laterality: N/A;   TONSILLECTOMY      No medications prior to admission.   Allergies  Allergen Reactions   Nifedipine Other (See Comments) and Palpitations    Heart races   Latex Rash    Social History   Tobacco Use   Smoking status: Some Days    Current packs/day: 0.00    Types: Cigarettes    Last attempt to quit: 02/07/2019    Years since quitting: 4.0   Smokeless tobacco: Never   Tobacco comments:    1 cigarette a day  Substance Use Topics   Alcohol use: Yes    Comment: 1x a year    Family History  Problem Relation Age of Onset   Breast cancer Mother 88   Breast cancer Sister 37   Breast cancer Maternal Grandmother 44   Breast cancer Sister 79     Review of Systems Pertinent items are noted in HPI.  Objective:   No data found. No intake/output data recorded. No intake/output data recorded.      General : Alert, cooperative, no distress, appears stated age. Hoarse voice.   Head:  Normocephalic/atraumatic    Eyes:  PERRL, conjunctiva/corneas clear, EOM's intact. Fundi could not be visualized Neck: Supple Chest:  Respirations unlabored Chest wall: no tenderness or deformity Heart: Regular rate and rhythm Abdomen: Soft, nontender and nondistended Extremities: warm and well-perfused Skin: normal turgor, color and texture Neurologic:  Alert, oriented x 3.  Eyes open spontaneously. PERRL, EOMI, VFC, no facial droop. V1-3 intact.  No dysarthria, tongue protrusion symmetric.  CNII-XII intact. Normal strength, sensation and reflexes throughout.  No pronator drift, full strength in legs.  Wide based gait       Data ReviewCBC:  Lab Results  Component Value Date   WBC 9.0 02/04/2023   RBC 5.56 (H) 02/04/2023   BMP:  Lab Results  Component Value Date   GLUCOSE 80 02/04/2023   CO2 23 02/04/2023   BUN 10 02/04/2023   BUN 7 06/26/2022   CREATININE 0.82 02/04/2023   CALCIUM 10.0 02/04/2023   Radiology review:   See clinic note for details  Assessment:   Active Problems:   * No active hospital problems. * 57 yo F with CAD and severe cervical stenosis with myelopathy  Plan:   - plan for C5-6, C6-7 ACDF.  Risks, benefits, alternatives, and expected convalescence were discussed with her.  Risks discussed included, but were not limited to bleeding, pain, infection, scar, dysphagia, dysphonia, spinal fluid leak, neurologic deficit, instability, pseudoarthrosis, damage to nearby organs, and death.  Informed consent was obtained.

## 2023-02-06 NOTE — Discharge Instructions (Signed)
Can remove transparent dressing and shower 48 hours after surgery Walk as much as possible No heavy lifting >10 lbs No excessive bending/twisting at the waist Resume aspirin on 7/20

## 2023-02-06 NOTE — Op Note (Signed)
PREOP DIAGNOSIS: cervical stenosis with myelopathy  POSTOP DIAGNOSIS: cervical stenosis with myelopathy   PROCEDURE: 1. Arthrodesis C5-6, anterior interbody technique, including Discectomy for decompression of spinal cord and exiting nerve roots with foraminotomies  2. Arthrodesis, additional level C6-7 anterior interbody technique, including Discectomy for decompression of spinal cord and exiting nerve roots with foraminotomies  3. Placement of intervertebral biomechanical device C5-6 4. Placement of intervertebral biomechanical device C6-7 5. Placement of anterior instrumentation consisting of interbody plate and screws C5-6-7 6. Use of morselized bone allograft  7. Use of intraoperative microscope  SURGEON: Dr. Hoyt Koch, MD  ASSISTANT: None.    ANESTHESIA: General Endotracheal  EBL: 25 ml  IMPLANTS: Medtronic Titan-C cage 7 mm medium x 2 15 mm screws x 6 39 mm plate DBM  SPECIMENS: None  DRAINS: None  COMPLICATIONS: None immediate  CONDITION: Hemodynamically stable to PACU  HISTORY: Patient is a 57 y.o. female presents with myelopathy symptoms of gait difficulty, dexterity loss, and bilateral UE pain which was progressive.  Option of ACDF surgery was discussed given progressive myelopathy.  Risks, benefits, alternatives, and expected convalescence were discussed with the patient.  Risks discussed included but were not limited to bleeding, pain, infection, dysphagia, dysphonia, pseudoarthrosis, hardware failure, adjacent segment disease, CSF leak, neurologic deficits, weakness, numbness, paralysis, coma, and death. After all questions were answered, informed consent was obtained.  PROCEDURE IN DETAIL: The patient was brought to the operating room and transferred to the operative table. After induction of general anesthesia, the patient was positioned on the operative table in the supine position with all pressure points meticulously padded. The skin of the neck was  then prepped and draped in the usual sterile fashion.  After timeout was conducted, the skin was infiltrated with local anesthetic. Skin incision was then made sharply and Bovie electrocautery was used to dissect the subcutaneous tissue until the platysma was identified. The platysma was then divided and undermined. The sternocleidomastoid muscle was then identified and, utilizing natural fascial planes in the neck, the prevertebral fascia was identified and the carotid sheath was retracted laterally and the trachea and esophagus retracted medially. Again using fluoroscopy, the C5-6 and C6-7 disc spaces were identified. Bovie electrocautery was used to dissect in the subperiosteal plane and elevate the bilateral longus coli muscles. Self-retaining retractors were then placed. At C5-6, caspar distraction pins were placed in the adjacent bodies to allow for gentle distraction.  At this point, the microscope was draped and brought into the field, and the remainder of the case was done under the microscope using microdissecting technique.  The disc space was incised sharply and combination of high speed drill, curettes, and rongeurs were use to initially complete a discectomy. The high-speed drill was then used to complete discectomy until the posterior annulus was identified and removed and the posterior longitudinal ligament was identified. Using a nerve hook, the PLL was elevated, and Kerrison rongeurs were used to remove the posterior longitudinal ligament and the ventral thecal sac was identified. Using a combination of curettes and rongeurs, complete decompression of the thecal sac and exiting nerve roots at this level was completed, and verified with easy passage of micro-nerve hook centrally and in the bilateral foramina.  Having completed our decompression, attention was turned to placement of the intervertebral device. Trial spacers were used to select a size 7 mm graft. This graft was then filled with  morcellized allograft, and inserted under live fluoroscopy.  Attention was then turned to the C6-7 level. Caspar distraction  pin was placed in the adjacent body to allow for gentle distraction of the disc space.  In a similar fashion, discectomy was completed initially with curettes and rongeurs, and completed with the drill. The PLL was again identified, elevated and incised. Using Kerrison rongeurs, decompression of the spinal cord and exiting roots was completed and confirmed with a dissector. Trial spacers were used to select a 6 mm graft. This graft was then filled with morcellized allograft, and inserted under live fluoroscopy.   After placement of the intervertebral devices, the caspar pins were removed.  An anterior cervical plate was placed across the interspaces for anterior fixation.  Using a high-speed drill, the cortex of the cervical vertebral bodies was punctured, and screws inserted in the vertebral bodies. Final fluoroscopic images in AP and lateral projections were taken to confirm good hardware placement.  At this point, after all counts were verified to be correct, meticulous hemostasis was secured using a combination of bipolar electrocautery and passive hemostatics. The platysma muscle was then closed using interrupted 3-0 Vicryl sutures, and the skin was closed with a 4-0 monocryl in subcuticular fashion. Steri-strips and sterile dressing were then applied and the drapes removed.  The patient tolerated the procedure well and was extubated in the room and taken to the postanesthesia care unit in stable condition.  All counts were correct at the end of the procedure.

## 2023-02-07 ENCOUNTER — Encounter (HOSPITAL_COMMUNITY): Payer: Self-pay | Admitting: Neurosurgery

## 2023-03-19 ENCOUNTER — Ambulatory Visit (HOSPITAL_COMMUNITY): Payer: Medicaid Other

## 2023-04-04 ENCOUNTER — Ambulatory Visit (HOSPITAL_COMMUNITY): Payer: Medicaid Other | Attending: Neurosurgery

## 2023-04-04 ENCOUNTER — Other Ambulatory Visit: Payer: Self-pay

## 2023-04-04 DIAGNOSIS — R29898 Other symptoms and signs involving the musculoskeletal system: Secondary | ICD-10-CM | POA: Insufficient documentation

## 2023-04-04 DIAGNOSIS — R262 Difficulty in walking, not elsewhere classified: Secondary | ICD-10-CM | POA: Insufficient documentation

## 2023-04-04 DIAGNOSIS — M542 Cervicalgia: Secondary | ICD-10-CM | POA: Diagnosis present

## 2023-04-04 NOTE — Therapy (Signed)
OUTPATIENT PHYSICAL THERAPY CERVICAL EVALUATION   Patient Name: Anita Michael MRN: [id number] DOB:[birth year], 57 y.o., female Today's Date: 04/04/2023  END OF SESSION:  PT End of Session - 04/04/23 1358     Visit Number 1    Number of Visits 12    Date for PT Re-Evaluation 05/16/23    Authorization Type  Medicaid [employer] (requested 12 visits)    PT Start Time 1300    PT Stop Time 1345    PT Time Calculation (min) 45 min    Activity Tolerance Patient tolerated treatment well    Behavior During Therapy WFL for tasks assessed/performed            Past Medical History:  Diagnosis Date   CAD (coronary artery disease)    a. s/p NSTEMI in 06/2021 with DES to mid-RCA. Residual disease along D1 and 1st Mrg with medical management recommended.   Essential hypertension    Fibromyalgia    Generalized headaches    History of stroke    Noted incidentally by brain MRI July 2022   Mitral regurgitation    a. moderate to severe by echo in 06/2021   Polycythemia    PVC's (premature ventricular contractions)    Renal insufficiency    Sleep apnea    Stroke [facility])    Past Surgical History:  Procedure Laterality Date   ANKLE SURGERY Right    as a child   ANTERIOR CERVICAL DECOMP/DISCECTOMY FUSION N/A 02/06/2023   Procedure: Cervical Five-Cervical Six, Cervical Six-Cervical Seven. Anterior Cervical Decompression/Discectomy Fusion;  Surgeon: [name], MD;  Location: [facility] OR;  Service: Neurosurgery;  Laterality: N/A;   APPENDECTOMY     BREAST BIOPSY Right 2015   Fibroadenoma   CHOLECYSTECTOMY     CORONARY STENT INTERVENTION N/A 07/06/2021   Procedure: CORONARY STENT INTERVENTION;  Surgeon: Tonny Bollman, MD;  Location[facility] INVASIVE CV LAB;  Service: Cardiovascular;  Laterality: N/A;   DRUG INDUCED ENDOSCOPY Bilateral 08/06/2022   Procedure: DRUG INDUCED ENDOSCOPY;  Surgeon: Christia Reading, MD;  Location: [facility];  Service: ENT;  Laterality: Bilateral;   LEFT  HEART CATH AND CORONARY ANGIOGRAPHY N/A 07/06/2021   Procedure: LEFT HEART CATH AND CORONARY ANGIOGRAPHY;  Surgeon: Tonny Bollman, MD;  [facility] LAB;  Service: Cardiovascular;  Laterality: N/A;   TONSILLECTOMY     Patient Active Problem List   Diagnosis Date Noted   Hemorrhoids 08/21/2021   Constipation 08/21/2021   Rectocele 08/21/2021   SUI (stress urinary incontinence, female) 08/21/2021   Vaginal pain 08/21/2021   PVC's (premature ventricular contractions) 07/07/2021   Tobacco use 07/06/2021   Mitral valve disorder 07/06/2021   NSTEMI (non-ST elevated myocardial infarction) (HCC) 07/06/2021   Other seasonal allergic rhinitis 05/14/2021   Claustrophobia 09/26/2020   Atrophic vaginitis 09/13/2020   Family history of muscular dystrophy 09/13/2020   Fibromyalgia 09/13/2020   Hypertensive disorder 09/13/2020   Irregular heart beat 09/13/2020   Mixed hyperlipidemia 09/13/2020   Nephrosclerosis 09/13/2020   Obesity with body mass index 30 or greater 09/13/2020   Sinusitis 09/13/2020   Vitamin D deficiency 09/13/2020   Difficulty with CPAP full face mask use 06/06/2020   Hepatic steatosis 06/06/2020   Blurry vision 06/03/2020   Difficulty walking 06/03/2020   Dizziness 06/03/2020   Headache disorder 06/03/2020   Obstructive sleep apnea syndrome 03/28/2020   Complex renal cyst 03/16/2020   Tubulovillous adenoma of colon 12/14/2019   Blood on toilet paper 11/11/2019   Encounter for screening  colonoscopy 11/11/2019   Hematochezia 11/11/2019   Family history of cancer 10/19/2019   Polycythemia 10/19/2019   Facial weakness 02/16/2019   Migraine 02/16/2019    PCP: [facility]  REFERRING PROVIDER: [name], MD  REFERRING DIAG: [phone] (ICD-10-CM) - Spinal stenosis, cervical region  THERAPY DIAG:  Neck pain  Weakness of both upper extremities  Difficulty in walking, not elsewhere classified  Rationale for Evaluation  and Treatment: Rehabilitation  ONSET DATE: C5-C7 anterior cervical decompression/discectomy fusion 02/06/23  SUBJECTIVE:                                                                                                                                                                                                         SUBJECTIVE STATEMENT: Arrives to the clinic s/p C5-C7 anterior cervical decompression/discectomy fusion 02/06/23. Patient reports that the surgery did not help with her HA. Patient has some issues with her grip and lifting. Denies any pain on the neck and will only c/o pain on occasion (see below). Patient also reports of occasional numbness on the B fingers. Patient had to have cervical fusion due to a deteriorated disc that pressed on her spinal cord. Patient was in a collar for 5 weeks. Recent MD consultation referred her to outpatient PT evaluation and management. Hand dominance: Right  PERTINENT HISTORY:  Fibromyalgia, Migraine  PAIN:  Are you having pain? Yes: NPRS scale: 4/10 Pain location: neck shoulders, legs, and arms Pain description: uncomfortable (cannot be described) Aggravating factors: movement (unknown direction) Relieving factors: relaxing showers  PRECAUTIONS: Other: no lifting > 5 lbs  RED FLAGS: Bowel or bladder incontinence: No, Spinal tumors: No, Cauda equina syndrome: No, and Compression fracture: No     WEIGHT BEARING RESTRICTIONS: No  FALLS:  Has patient fallen in last 6 months? Yes. Number of falls 5x (2 weeks ago was the latest) because she loses balance and her legs feel weak.  LIVING ENVIRONMENT: Lives with: lives with their son and lives with grandson Lives in: House/apartment Stairs: No Has following equipment at home: Ramped entry  OCCUPATION: Lawyer (part-time)  PLOF: Independent and Independent with basic ADLs  PATIENT GOALS: "hopefully no problems with mobility"  NEXT MD VISIT: December 2024  OBJECTIVE:    DIAGNOSTIC FINDINGS:  02/06/23 EXAM: DG CERVICAL SPINE - 1 VIEW   COMPARISON:  Cervical spine radiographs 12/31/2021 at [facility] neurosurgery.   FLUOROSCOPY: Exposure Index (as provided by the fluoroscopic device): 9.29   FINDINGS: 2 intraoperative images demonstrate disc spacers at C5-6 and C6-7. Anterior plate and screw fixation is  present at both levels. Alignment is anatomic. Hardware is intact.   IMPRESSION: Anterior cervical discectomy and fusion at C5-6 and C6-7.  PATIENT SURVEYS:  NDI 15/50 = 30%  COGNITION: Overall cognitive status: Within functional limits for tasks assessed  SENSATION: Intact on B fingers and L LE, impaired on R LE  POSTURE: rounded shoulders and forward head  PALPATION: Grade 1 tenderness and moderate muscles spasm on B paracervicals, upper and middle rhomboids   CERVICAL ROM:   Active ROM A/PROM (deg) eval  Flexion 5  Extension 5  Right lateral flexion   Left lateral flexion   Right rotation 10  Left rotation 20   (Blank rows = not tested)  UPPER EXTREMITY ROM:  Active ROM Right eval Left eval  Shoulder flexion [facility]  Shoulder extension    Shoulder abduction [facility] WFL  Shoulder adduction [facility]  Shoulder extension    Shoulder internal rotation    Shoulder external rotation    Elbow flexion WFL WFL  Elbow extension [facility] WFL  Wrist flexion    Wrist extension    Wrist ulnar deviation    Wrist radial deviation    Wrist pronation    Wrist supination     (Blank rows = not tested)  UPPER EXTREMITY MMT:  MMT Right eval Left eval  Shoulder flexion 4 4  Shoulder extension    Shoulder abduction 4- 4-  Shoulder adduction    Shoulder extension    Shoulder internal rotation 4 4  Shoulder external rotation 4 4  Middle trapezius    Lower trapezius    Elbow flexion 4 4  Elbow extension 3+ 3+  Wrist flexion    Wrist extension    Wrist ulnar deviation    Wrist radial deviation    Wrist pronation    Wrist supination     Grip strength 3+ 4-   (Blank rows = not tested)  LOWER EXTREMITY MMT:  MMT Right eval Left eval  Hip flexion 3+ 3+  Hip abduction 4- 4-  Hip adduction 4- 4-  Knee flexion 4+ 4+  Knee extension 4 4  Ankle dorsiflexion 4 4  Ankle plantarflexion 4 4   (Blank rows = not tested)   FUNCTIONAL TESTS:  5 times sit to stand: 30.05 sec Timed up and go (TUG): 13.16 sec 2 minute walk test: 276 ft, needs to hold on to the walls on occasion  TODAY'S TREATMENT:                                                                                                                              DATE:  04/04/23 Evaluation and patient education   PATIENT EDUCATION:  Education details: Educated on the pathoanatomy of cervical fusion Educated on the goals and course of rehab. Person educated: Patient Education method: Explanation Education comprehension: verbalized understanding  HOME EXERCISE PROGRAM: None provided to date  ASSESSMENT:  CLINICAL IMPRESSION: Patient is a 57 y.o. female  who was seen today for physical therapy evaluation and treatment for S/P anterior cervical fusion. Patient had cervical fusion further defined by difficulty with driving and lifting due to pain, weakness, and decreased soft tissue extensibility and ROM. Skilled PT is required to address the impairments and functional limitations listed below.    OBJECTIVE IMPAIRMENTS: decreased activity tolerance, decreased balance, decreased mobility, difficulty walking, decreased ROM, decreased strength, impaired flexibility, and pain.   ACTIVITY LIMITATIONS: carrying, lifting, bending, standing, and squatting  PARTICIPATION LIMITATIONS: meal prep, cleaning, laundry, driving, and community activity  PERSONAL FACTORS: 1-2 comorbidities: Migraine, Fibromyalgia  are also affecting patient's functional outcome.   REHAB POTENTIAL: Fair    CLINICAL DECISION MAKING: Evolving/moderate complexity  EVALUATION COMPLEXITY:  Moderate   GOALS: Goals reviewed with patient? Yes  SHORT TERM GOALS: Target date: 04/25/23  Pt will demonstrate indep in HEP to facilitate carry-over of skilled services and improve functional outcomes  LONG TERM GOALS: Target date: 05/16/23  Pt will demonstrate a decrease in NDI score by 19 % to demonstrate significant improvement in ADLs  Baseline: 30% Goal status: INITIAL  2.  Patient will demonstrate increase in UE strength to 4 to facilitate ease in ADLs  Baseline: 3+ Goal status: INITIAL  3.  Pt will demonstrate increase in LE strength to 4+ to facilitate ease and safety in ambulation  Baseline: 3+ Goal status: INITIAL  4.  Pt will increase by at least 40 ft in order to demonstrate clinically significant improvement in community ambulation  Baseline: 276 ft Goal status: INITIAL  5.  Pt will have a decrease in TUG score by at least 3 sec in order to demonstrate clinically significant improvement in community ambulation  Baseline: 13.16 sec Goal status: INITIAL  6.  Pt will decrease 5TSTS by at least 3 seconds in order to demonstrate clinically significant improvement in LE strength   Baseline: 30.05 sec Goal status: INITIAL   PLAN:  PT FREQUENCY: 2x/week  PT DURATION: 6 weeks  PLANNED INTERVENTIONS: Therapeutic exercises, Therapeutic activity, Neuromuscular re-education, Balance training, Gait training, Patient/Family education, Self Care, Dry Needling, Cryotherapy, Moist heat, and Manual therapy  PLAN FOR NEXT SESSION: Provide HEP. Begin gentle cervical stabilization and balance exercises per protocol and as tolerated.   Tish Frederickson. Saralee Bolick, PT, DPT, OCS Board-Certified Clinical Specialist in Orthopedic PT PT Compact Privilege # ([place]): [phone] T 04/04/2023, 2:02 PM

## 2023-04-08 ENCOUNTER — Telehealth: Payer: Self-pay | Admitting: Urology

## 2023-04-08 ENCOUNTER — Other Ambulatory Visit: Payer: Self-pay

## 2023-04-08 DIAGNOSIS — N281 Cyst of kidney, acquired: Secondary | ICD-10-CM

## 2023-04-08 NOTE — Telephone Encounter (Signed)
Imaging ordered.  Unable to reach patient by phone, mychart message sent to patient.

## 2023-04-08 NOTE — Telephone Encounter (Signed)
Patient called said she was to have ultrasound before her yearly appointment , there is no order pt changed appointment until 06/06/23

## 2023-04-09 ENCOUNTER — Ambulatory Visit: Payer: Medicaid Other | Admitting: Urology

## 2023-04-10 ENCOUNTER — Encounter (HOSPITAL_COMMUNITY): Payer: Medicaid Other | Admitting: Physical Therapy

## 2023-04-17 ENCOUNTER — Ambulatory Visit (HOSPITAL_COMMUNITY): Payer: Medicaid Other

## 2023-04-17 DIAGNOSIS — M542 Cervicalgia: Secondary | ICD-10-CM | POA: Diagnosis not present

## 2023-04-17 DIAGNOSIS — R262 Difficulty in walking, not elsewhere classified: Secondary | ICD-10-CM

## 2023-04-17 DIAGNOSIS — R29898 Other symptoms and signs involving the musculoskeletal system: Secondary | ICD-10-CM

## 2023-04-17 NOTE — Therapy (Signed)
OUTPATIENT PHYSICAL THERAPY CERVICAL TREATMENT   Patient Name: Anita Michael MRN: [id number] DOB:1966/07/21, 57 y.o., female Today's Date: 04/17/2023  END OF SESSION:  PT End of Session - 04/17/23 1540     Visit Number 2    Number of Visits 12    Date for PT Re-Evaluation 05/16/23    Authorization Type [place] Medicaid Healthy (approved 12 visits)    Authorization Time Period 04/04/23-07/02/23    Authorization - Visit Number 1    Authorization - Number of Visits 10    Progress Note Due on Visit 10    PT Start Time 1530    PT Stop Time 1610    PT Time Calculation (min) 40 min    Activity Tolerance Patient tolerated treatment well    Behavior During Therapy [facility] for tasks assessed/performed            Past Medical History:  Diagnosis Date   CAD (coronary artery disease)    a. s/p NSTEMI in 06/2021 with DES to mid-RCA. Residual disease along D1 and 1st Mrg with medical management recommended.   Essential hypertension    Fibromyalgia    Generalized headaches    History of stroke    Noted incidentally by brain MRI July 2022   Mitral regurgitation    a. moderate to severe by echo in 06/2021   Polycythemia    PVC's (premature ventricular contractions)    Renal insufficiency    Sleep apnea    Stroke [facility])    Past Surgical History:  Procedure Laterality Date   ANKLE SURGERY Right    as a child   ANTERIOR CERVICAL DECOMP/DISCECTOMY FUSION N/A 02/06/2023   Procedure: Cervical Five-Cervical Six, Cervical Six-Cervical Seven. Anterior Cervical Decompression/Discectomy Fusion;  Surgeon: [name], MD;  Location: [facility] OR;  Service: Neurosurgery;  Laterality: N/A;   APPENDECTOMY     BREAST BIOPSY Right 2015   Fibroadenoma   CHOLECYSTECTOMY     CORONARY STENT INTERVENTION N/A 07/06/2021   Procedure: CORONARY STENT INTERVENTION;  Surgeon: Tonny Bollman, MD;  Location: [facility] INVASIVE CV LAB;  Service: Cardiovascular;  Laterality: N/A;   DRUG INDUCED ENDOSCOPY Bilateral  08/06/2022   Procedure: DRUG INDUCED ENDOSCOPY;  Surgeon: Christia Reading, MD;  Location: [facility];  Service: ENT;  Laterality: Bilateral;   LEFT HEART CATH AND CORONARY ANGIOGRAPHY N/A 07/06/2021   Procedure: LEFT HEART CATH AND CORONARY ANGIOGRAPHY;  Surgeon: Tonny Bollman, MD;  Location: [facility] INVASIVE CV LAB;  Service: Cardiovascular;  Laterality: N/A;   TONSILLECTOMY     Patient Active Problem List   Diagnosis Date Noted   Hemorrhoids 08/21/2021   Constipation 08/21/2021   Rectocele 08/21/2021   SUI (stress urinary incontinence, female) 08/21/2021   Vaginal pain 08/21/2021   PVC's (premature ventricular contractions) 07/07/2021   Tobacco use 07/06/2021   Mitral valve disorder 07/06/2021   NSTEMI (non-ST elevated myocardial infarction) (HCC) 07/06/2021   Other seasonal allergic rhinitis 05/14/2021   Claustrophobia 09/26/2020   Atrophic vaginitis 09/13/2020   Family history of muscular dystrophy 09/13/2020   Fibromyalgia 09/13/2020   Hypertensive disorder 09/13/2020   Irregular heart beat 09/13/2020   Mixed hyperlipidemia 09/13/2020   Nephrosclerosis 09/13/2020   Obesity with body mass index 30 or greater 09/13/2020   Sinusitis 09/13/2020   Vitamin D deficiency 09/13/2020   Difficulty with CPAP full face mask use 06/06/2020   Hepatic steatosis 06/06/2020   Blurry vision 06/03/2020   Difficulty walking 06/03/2020   Dizziness 06/03/2020   Headache disorder  06/03/2020   Obstructive sleep apnea syndrome 03/28/2020   Complex renal cyst 03/16/2020   Tubulovillous adenoma of colon 12/14/2019   Blood on toilet paper 11/11/2019   Encounter for screening colonoscopy 11/11/2019   Hematochezia 11/11/2019   Family history of cancer 10/19/2019   Polycythemia 10/19/2019   Facial weakness 02/16/2019   Migraine 02/16/2019    [facility]: [facility]  REFERRING PROVIDER: [name], MD  REFERRING DIAG: [phone] (ICD-10-CM) - Spinal stenosis,  cervical region  THERAPY DIAG:  Neck pain  Weakness of both upper extremities  Difficulty in walking, not elsewhere classified  Rationale for Evaluation and Treatment: Rehabilitation  ONSET DATE: C5-C7 anterior cervical decompression/discectomy fusion 02/06/23  SUBJECTIVE:                                                                                                                                                                                                         SUBJECTIVE STATEMENT: Patient reports that she's sore today and is pain = 8/10 on the neck.   EVAL: Arrives to the clinic s/p C5-C7 anterior cervical decompression/discectomy fusion 02/06/23. Patient reports that the surgery did not help with her HA. Patient has some issues with her grip and lifting. Denies any pain on the neck and will only c/o pain on occasion (see below). Patient also reports of occasional numbness on the B fingers. Patient had to have cervical fusion due to a deteriorated disc that pressed on her spinal cord. Patient was in a collar for 5 weeks. Recent MD consultation referred her to outpatient PT evaluation and management. Hand dominance: Right  PERTINENT HISTORY:  Fibromyalgia, Migraine  PAIN:  Are you having pain? Yes: NPRS scale: 4/10 Pain location: neck shoulders, legs, and arms Pain description: uncomfortable (cannot be described) Aggravating factors: movement (unknown direction) Relieving factors: relaxing showers  PRECAUTIONS: Other: no lifting > 5 lbs  RED FLAGS: Bowel or bladder incontinence: No, Spinal tumors: No, Cauda equina syndrome: No, and Compression fracture: No     WEIGHT BEARING RESTRICTIONS: No  FALLS:  Has patient fallen in last 6 months? Yes. Number of falls 5x (2 weeks ago was the latest) because she loses balance and her legs feel weak.  LIVING ENVIRONMENT: Lives with: lives with their son and lives with grandson Lives in: House/apartment Stairs: No Has following  equipment at home: Ramped entry  OCCUPATION: Lawyer (part-time)  PLOF: Independent and Independent with basic ADLs  PATIENT GOALS: "hopefully no problems with mobility"  NEXT MD VISIT: December 2024  OBJECTIVE:   DIAGNOSTIC FINDINGS:  02/06/23 EXAM: DG CERVICAL SPINE - 1 VIEW   COMPARISON:  Cervical spine radiographs 12/31/2021 at [facility] neurosurgery.   FLUOROSCOPY: Exposure Index (as provided by the fluoroscopic device): 9.29   FINDINGS: 2 intraoperative images demonstrate disc spacers at C5-6 and C6-7. Anterior plate and screw fixation is present at both levels. Alignment is anatomic. Hardware is intact.   IMPRESSION: Anterior cervical discectomy and fusion at C5-6 and C6-7.  PATIENT SURVEYS:  NDI 15/50 = 30%  COGNITION: Overall cognitive status: Within functional limits for tasks assessed  SENSATION: Intact on B fingers and L LE, impaired on R LE  POSTURE: rounded shoulders and forward head  PALPATION: Grade 1 tenderness and moderate muscles spasm on B paracervicals, upper and middle rhomboids   CERVICAL ROM:   Active ROM A/PROM (deg) eval  Flexion 5  Extension 5  Right lateral flexion   Left lateral flexion   Right rotation 10  Left rotation 20   (Blank rows = not tested)  UPPER EXTREMITY ROM:  Active ROM Right eval Left eval  Shoulder flexion [facility]  Shoulder extension    Shoulder abduction [facility] WFL  Shoulder adduction [facility]  Shoulder extension    Shoulder internal rotation    Shoulder external rotation    Elbow flexion WFL WFL  Elbow extension [facility] WFL  Wrist flexion    Wrist extension    Wrist ulnar deviation    Wrist radial deviation    Wrist pronation    Wrist supination     (Blank rows = not tested)  UPPER EXTREMITY MMT:  MMT Right eval Left eval  Shoulder flexion 4 4  Shoulder extension    Shoulder abduction 4- 4-  Shoulder adduction    Shoulder extension    Shoulder internal rotation 4 4  Shoulder  external rotation 4 4  Middle trapezius    Lower trapezius    Elbow flexion 4 4  Elbow extension 3+ 3+  Wrist flexion    Wrist extension    Wrist ulnar deviation    Wrist radial deviation    Wrist pronation    Wrist supination    Grip strength 3+ 4-   (Blank rows = not tested)  LOWER EXTREMITY MMT:  MMT Right eval Left eval  Hip flexion 3+ 3+  Hip abduction 4- 4-  Hip adduction 4- 4-  Knee flexion 4+ 4+  Knee extension 4 4  Ankle dorsiflexion 4 4  Ankle plantarflexion 4 4   (Blank rows = not tested)   FUNCTIONAL TESTS:  5 times sit to stand: 30.05 sec Timed up and go (TUG): 13.16 sec 2 minute walk test: 276 ft, needs to hold on to the walls on occasion  TODAY'S TREATMENT:                                                                                                                              DATE:  04/17/23 UBE, forward, level 1 x  4' Doorway pec stretches, 60 deg abd x 30" x 3 Seated:  Shoulder shrugs x 3" x 10 x 2  Scapular retraction x 3" x 10 x 2  Shoulder rolls, ant/post x 10 x 2 each  Thoracic rotation x 10 x 2 each  Gentle cervical isometrics in neutral flex/ext, sidebending, rot,  x 3" x 10 on each Supine:  Cervical retraction x 3" x 10 x 2  Hooklying marches x 10 x 2 Quadruped:  Cat/cow exercise x 10 x 2  04/04/23 Evaluation and patient education   PATIENT EDUCATION:  Education details: Written HEP updated, provided, and reviewed Person educated: Patient Education method: Explanation Education comprehension: verbalized understanding  HOME EXERCISE PROGRAM: Access Code: [email or web address] URL: [email or web address] Date: 04/17/2023 Prepared by: Krystal Clark  Exercises - Seated Shoulder Shrug  - 1-2 x daily - 7 x weekly - 2 sets - 10 reps - 3 hold - Seated Scapular Retraction  - 1-2 x daily - 7 x weekly - 2 sets - 10 reps - 3 hold - Shoulder Rolls in Sitting  - 1 x daily - 7 x weekly - 2 sets - 10 reps - Seated Trunk Rotation  - 1-2  x daily - 7 x weekly - 2 sets - 10 reps - Supine Cervical Retraction with Towel  - 1-2 x daily - 7 x weekly - 2 sets - 10 reps - 3 hold - Seated Isometric Cervical Sidebending  - 1-2 x daily - 7 x weekly - 2 sets - 10 reps - 3 hold - Seated Isometric Cervical Extension  - 1-2 x daily - 7 x weekly - 2 sets - 10 reps - 3 hold - Seated Isometric Cervical Flexion  - 1-2 x daily - 7 x weekly - 2 sets - 10 reps - 3 hold - Cat Cow  - 1-2 x daily - 7 x weekly - 2 sets - 10 reps - Supine March  - 1-2 x daily - 7 x weekly - 2 sets - 10 reps - Doorway Pec Stretch at 60 Degrees Abduction with Arm Straight  - 1-2 x daily - 7 x weekly - 3 reps - 30 hold  ASSESSMENT:  CLINICAL IMPRESSION: Interventions today were based on the 10th week of cervical fusion rehab protocol as patient on her 10th week post-op. Tolerated all activities without worsening of symptoms except when doing cat and cow exercise where patient reported of some stinging sensation on the upper shoulder. Demonstrated appropriate levels of fatigue. Provided little to no amount of cueing to ensure correct execution of activity with good carry-over. To date, skilled PT is required to address the impairments and improve function.  EVAL: Patient is a 57 y.o. female who was seen today for physical therapy evaluation and treatment for S/P anterior cervical fusion. Patient had cervical fusion further defined by difficulty with driving and lifting due to pain, weakness, and decreased soft tissue extensibility and ROM. Skilled PT is required to address the impairments and functional limitations listed below.    OBJECTIVE IMPAIRMENTS: decreased activity tolerance, decreased balance, decreased mobility, difficulty walking, decreased ROM, decreased strength, impaired flexibility, and pain.   ACTIVITY LIMITATIONS: carrying, lifting, bending, standing, and squatting  PARTICIPATION LIMITATIONS: meal prep, cleaning, laundry, driving, and community  activity  PERSONAL FACTORS: 1-2 comorbidities: Migraine, Fibromyalgia  are also affecting patient's functional outcome.   REHAB POTENTIAL: Fair    CLINICAL DECISION MAKING: Evolving/moderate complexity  EVALUATION COMPLEXITY: Moderate   GOALS: Goals reviewed with patient? Yes  SHORT TERM GOALS: Target date: 04/25/23  Pt will demonstrate indep in HEP to facilitate carry-over of skilled services and improve functional outcomes  LONG TERM GOALS: Target date: 05/16/23  Pt will demonstrate a decrease in NDI score by 19 % to demonstrate significant improvement in ADLs  Baseline: 30% Goal status: INITIAL  2.  Patient will demonstrate increase in UE strength to 4 to facilitate ease in ADLs  Baseline: 3+ Goal status: INITIAL  3.  Pt will demonstrate increase in LE strength to 4+ to facilitate ease and safety in ambulation  Baseline: 3+ Goal status: INITIAL  4.  Pt will increase by at least 40 ft in order to demonstrate clinically significant improvement in community ambulation  Baseline: 276 ft Goal status: INITIAL  5.  Pt will have a decrease in TUG score by at least 3 sec in order to demonstrate clinically significant improvement in community ambulation  Baseline: 13.16 sec Goal status: INITIAL  6.  Pt will decrease 5TSTS by at least 3 seconds in order to demonstrate clinically significant improvement in LE strength   Baseline: 30.05 sec Goal status: INITIAL   PLAN:  PT FREQUENCY: 2x/week  PT DURATION: 6 weeks  PLANNED INTERVENTIONS: Therapeutic exercises, Therapeutic activity, Neuromuscular re-education, Balance training, Gait training, Patient/Family education, Self Care, Dry Needling, Cryotherapy, Moist heat, and Manual therapy  PLAN FOR NEXT SESSION: Continue POC and may progress as tolerated with emphasis on gentle cervical stabilization and balance exercises per protocol and as tolerated.   Tish Frederickson. Jaxston Chohan, PT, DPT, OCS Board-Certified Clinical  Specialist in Orthopedic PT PT Compact Privilege # ([place]): [id number] T 04/17/2023, 3:42 PM

## 2023-04-23 ENCOUNTER — Telehealth (HOSPITAL_COMMUNITY): Payer: Self-pay

## 2023-04-23 ENCOUNTER — Encounter (HOSPITAL_COMMUNITY): Payer: Medicaid Other

## 2023-04-23 NOTE — Telephone Encounter (Signed)
Called patient today for her 1st no show. Patient thought her appointment was tomorrow. Patient then rescheduled her appointment today for tomorrow 04/24/23 at 4:00 pm  Anita Michael, PT, DPT, OCS Board-Certified Clinical Specialist in Orthopedic PT PT Compact Privilege # ([place]): [phone] T

## 2023-04-24 ENCOUNTER — Ambulatory Visit (HOSPITAL_COMMUNITY)
Admission: RE | Admit: 2023-04-24 | Discharge: 2023-04-24 | Disposition: A | Discharge status: Home / Self Care | Payer: Medicaid Other | Source: Ambulatory Visit | Attending: Urology | Admitting: Urology

## 2023-04-24 ENCOUNTER — Ambulatory Visit (HOSPITAL_COMMUNITY): Payer: Medicaid Other | Attending: Neurosurgery | Admitting: Physical Therapy

## 2023-04-24 DIAGNOSIS — M542 Cervicalgia: Secondary | ICD-10-CM | POA: Insufficient documentation

## 2023-04-24 DIAGNOSIS — R29898 Other symptoms and signs involving the musculoskeletal system: Secondary | ICD-10-CM | POA: Diagnosis present

## 2023-04-24 DIAGNOSIS — R262 Difficulty in walking, not elsewhere classified: Secondary | ICD-10-CM | POA: Diagnosis present

## 2023-04-24 DIAGNOSIS — N281 Cyst of kidney, acquired: Secondary | ICD-10-CM | POA: Insufficient documentation

## 2023-04-24 NOTE — Therapy (Signed)
OUTPATIENT PHYSICAL THERAPY CERVICAL TREATMENT   Patient Name: Anita Michael MRN: [id number] DOB:[birth year], 57 y.o., female Today's Date: 04/24/2023  END OF SESSION:  PT End of Session - 04/24/23 1601     Visit Number 3    Number of Visits 12    Date for PT Re-Evaluation 05/16/23    Authorization Type [place] [employer] (approved 12 visits)    Authorization Time Period 04/04/23-07/02/23    Authorization - Number of Visits 10    Progress Note Due on Visit 10    Activity Tolerance Patient tolerated treatment well    Behavior During Therapy [facility] for tasks assessed/performed            Past Medical History:  Diagnosis Date   CAD (coronary artery disease)    a. s/p NSTEMI in 06/2021 with DES to mid-RCA. Residual disease along D1 and 1st Mrg with medical management recommended.   Essential hypertension    Fibromyalgia    Generalized headaches    History of stroke    Noted incidentally by brain MRI July 2022   Mitral regurgitation    a. moderate to severe by echo in 06/2021   Polycythemia    PVC's (premature ventricular contractions)    Renal insufficiency    Sleep apnea    Stroke [facility])    Past Surgical History:  Procedure Laterality Date   ANKLE SURGERY Right    as a child   ANTERIOR CERVICAL DECOMP/DISCECTOMY FUSION N/A 02/06/2023   Procedure: Cervical Five-Cervical Six, Cervical Six-Cervical Seven. Anterior Cervical Decompression/Discectomy Fusion;  Surgeon: [name], MD;  Location: [facility] OR;  Service: Neurosurgery;  Laterality: N/A;   APPENDECTOMY     BREAST BIOPSY Right 2015   Fibroadenoma   CHOLECYSTECTOMY     CORONARY STENT INTERVENTION N/A 07/06/2021   Procedure: CORONARY STENT INTERVENTION;  Surgeon: Tonny Bollman, MD;  Location: [facility] INVASIVE CV LAB;  Service: Cardiovascular;  Laterality: N/A;   DRUG INDUCED ENDOSCOPY Bilateral 08/06/2022   Procedure: DRUG INDUCED ENDOSCOPY;  Surgeon: Christia Reading, MD;  Location: [facility];  Service:  ENT;  Laterality: Bilateral;   LEFT HEART CATH AND CORONARY ANGIOGRAPHY N/A 07/06/2021   Procedure: LEFT HEART CATH AND CORONARY ANGIOGRAPHY;  Surgeon: Tonny Bollman, MD;  Location: [facility] INVASIVE CV LAB;  Service: Cardiovascular;  Laterality: N/A;   TONSILLECTOMY     Patient Active Problem List   Diagnosis Date Noted   Hemorrhoids 08/21/2021   Constipation 08/21/2021   Rectocele 08/21/2021   SUI (stress urinary incontinence, female) 08/21/2021   Vaginal pain 08/21/2021   PVC's (premature ventricular contractions) 07/07/2021   Tobacco use 07/06/2021   Mitral valve disorder 07/06/2021   NSTEMI (non-ST elevated myocardial infarction) (HCC) 07/06/2021   Other seasonal allergic rhinitis 05/14/2021   Claustrophobia 09/26/2020   Atrophic vaginitis 09/13/2020   Family history of muscular dystrophy 09/13/2020   Fibromyalgia 09/13/2020   Hypertensive disorder 09/13/2020   Irregular heart beat 09/13/2020   Mixed hyperlipidemia 09/13/2020   Nephrosclerosis 09/13/2020   Obesity with body mass index 30 or greater 09/13/2020   Sinusitis 09/13/2020   Vitamin D deficiency 09/13/2020   Difficulty with CPAP full face mask use 06/06/2020   Hepatic steatosis 06/06/2020   Blurry vision 06/03/2020   Difficulty walking 06/03/2020   Dizziness 06/03/2020   Headache disorder 06/03/2020   Obstructive sleep apnea syndrome 03/28/2020   Complex renal cyst 03/16/2020   Tubulovillous adenoma of colon 12/14/2019   Blood on toilet paper 11/11/2019   Encounter  for screening colonoscopy 11/11/2019   Hematochezia 11/11/2019   Family history of cancer 10/19/2019   Polycythemia 10/19/2019   Facial weakness 02/16/2019   Migraine 02/16/2019    [facility]P: [facility]  REFERRING PROVIDER: [name], MD  REFERRING DIAG: [id number] (ICD-10-CM) - Spinal stenosis, cervical region  THERAPY DIAG:  Neck pain  Weakness of both upper extremities  Difficulty in walking, not elsewhere  classified  Rationale for Evaluation and Treatment: Rehabilitation  ONSET DATE: C5-C7 anterior cervical decompression/discectomy fusion 02/06/23  SUBJECTIVE:                                                                                                                                                                                                         SUBJECTIVE STATEMENT: Patient reports that she's sore today and is pain = 8/10 on the neck.   EVAL: Arrives to the clinic s/p C5-C7 anterior cervical decompression/discectomy fusion 02/06/23. Patient reports that the surgery did not help with her HA. Patient has some issues with her grip and lifting. Denies any pain on the neck and will only c/o pain on occasion (see below). Patient also reports of occasional numbness on the B fingers. Patient had to have cervical fusion due to a deteriorated disc that pressed on her spinal cord. Patient was in a collar for 5 weeks. Recent MD consultation referred her to outpatient PT evaluation and management. Hand dominance: Right  PERTINENT HISTORY:  Fibromyalgia, Migraine  PAIN:  Are you having pain? Yes: NPRS scale: 4/10 Pain location: neck shoulders, legs, and arms Pain description: uncomfortable (cannot be described) Aggravating factors: movement (unknown direction) Relieving factors: relaxing showers  PRECAUTIONS: Other: no lifting > 5 lbs  RED FLAGS: Bowel or bladder incontinence: No, Spinal tumors: No, Cauda equina syndrome: No, and Compression fracture: No     WEIGHT BEARING RESTRICTIONS: No  FALLS:  Has patient fallen in last 6 months? Yes. Number of falls 5x (2 weeks ago was the latest) because she loses balance and her legs feel weak.  LIVING ENVIRONMENT: Lives with: lives with their son and lives with grandson Lives in: House/apartment Stairs: No Has following equipment at home: Ramped entry  OCCUPATION: Lawyer (part-time)  PLOF: Independent and Independent with  basic ADLs  PATIENT GOALS: "hopefully no problems with mobility"  NEXT MD VISIT: December 2024  OBJECTIVE:   DIAGNOSTIC FINDINGS:  02/06/23 EXAM: DG CERVICAL SPINE - 1 VIEW   COMPARISON:  Cervical spine radiographs 12/31/2021 at [facility] neurosurgery.   FLUOROSCOPY: Exposure Index (as provided by the fluoroscopic device): 9.29  FINDINGS: 2 intraoperative images demonstrate disc spacers at C5-6 and C6-7. Anterior plate and screw fixation is present at both levels. Alignment is anatomic. Hardware is intact.   IMPRESSION: Anterior cervical discectomy and fusion at C5-6 and C6-7.  PATIENT SURVEYS:  NDI 15/50 = 30%  COGNITION: Overall cognitive status: Within functional limits for tasks assessed  SENSATION: Intact on B fingers and L LE, impaired on R LE  POSTURE: rounded shoulders and forward head  PALPATION: Grade 1 tenderness and moderate muscles spasm on B paracervicals, upper and middle rhomboids   CERVICAL ROM:   Active ROM A/PROM (deg) eval  Flexion 5  Extension 5  Right lateral flexion   Left lateral flexion   Right rotation 10  Left rotation 20   (Blank rows = not tested)  UPPER EXTREMITY ROM:  Active ROM Right eval Left eval  Shoulder flexion [facility]  Shoulder extension    Shoulder abduction [facility] WFL  Shoulder adduction [facility]  Shoulder extension    Shoulder internal rotation    Shoulder external rotation    Elbow flexion WFL WFL  Elbow extension [facility] WFL  Wrist flexion    Wrist extension    Wrist ulnar deviation    Wrist radial deviation    Wrist pronation    Wrist supination     (Blank rows = not tested)  UPPER EXTREMITY MMT:  MMT Right eval Left eval  Shoulder flexion 4 4  Shoulder extension    Shoulder abduction 4- 4-  Shoulder adduction    Shoulder extension    Shoulder internal rotation 4 4  Shoulder external rotation 4 4  Middle trapezius    Lower trapezius    Elbow flexion 4 4  Elbow extension 3+ 3+  Wrist flexion     Wrist extension    Wrist ulnar deviation    Wrist radial deviation    Wrist pronation    Wrist supination    Grip strength 3+ 4-   (Blank rows = not tested)  LOWER EXTREMITY MMT:  MMT Right eval Left eval  Hip flexion 3+ 3+  Hip abduction 4- 4-  Hip adduction 4- 4-  Knee flexion 4+ 4+  Knee extension 4 4  Ankle dorsiflexion 4 4  Ankle plantarflexion 4 4   (Blank rows = not tested)   FUNCTIONAL TESTS:  5 times sit to stand: 30.05 sec Timed up and go (TUG): 13.16 sec 2 minute walk test: 276 ft, needs to hold on to the walls on occasion  TODAY'S TREATMENT:                                                                                                                              DATE:  04/24/23 Manual to decrease tightness, spasm and pain Cervical excursions x 5 Scapular retraction x 10  Shoulder shrug up back and relax x 5 Thoracic excursions x 5  Cervical isometrics x 10 for sidebend and extension  Retro shoulder circles x 10  Theraband red Scapular retraction x 10 Rows x 10  UBE  level 1 x 4 minutes .   04/17/23 UBE, forward, level 1 x 4' Doorway pec stretches, 60 deg abd x 30" x 3 Seated:  Shoulder shrugs x 3" x 10 x 2  Scapular retraction x 3" x 10 x 2  Shoulder rolls, ant/post x 10 x 2 each  Thoracic rotation x 10 x 2 each  Gentle cervical isometrics in neutral flex/ext, sidebending, rot,  x 3" x 10 on each Supine:  Cervical retraction x 3" x 10 x 2  Hooklying marches x 10 x 2 Quadruped:  Cat/cow exercise x 10 x 2  04/04/23 Evaluation and patient education   PATIENT EDUCATION:  Education details: Written HEP updated, provided, and reviewed Person educated: Patient Education method: Explanation Education comprehension: verbalized understanding  HOME EXERCISE PROGRAM: Access Code: [email or web address] URL: [email or web address] Date: 04/17/2023 Prepared by: Krystal Clark  Exercises - Seated Shoulder Shrug  - 1-2 x daily - 7 x weekly - 2  sets - 10 reps - 3 hold - Seated Scapular Retraction  - 1-2 x daily - 7 x weekly - 2 sets - 10 reps - 3 hold - Shoulder Rolls in Sitting  - 1 x daily - 7 x weekly - 2 sets - 10 reps - Seated Trunk Rotation  - 1-2 x daily - 7 x weekly - 2 sets - 10 reps - Supine Cervical Retraction with Towel  - 1-2 x daily - 7 x weekly - 2 sets - 10 reps - 3 hold - Seated Isometric Cervical Sidebending  - 1-2 x daily - 7 x weekly - 2 sets - 10 reps - 3 hold - Seated Isometric Cervical Extension  - 1-2 x daily - 7 x weekly - 2 sets - 10 reps - 3 hold - Seated Isometric Cervical Flexion  - 1-2 x daily - 7 x weekly - 2 sets - 10 reps - 3 hold - Cat Cow  - 1-2 x daily - 7 x weekly - 2 sets - 10 reps - Supine March  - 1-2 x daily - 7 x weekly - 2 sets - 10 reps - Doorway Pec Stretch at 60 Degrees Abduction with Arm Straight  - 1-2 x daily - 7 x weekly - 3 reps - 30 hold 04/24/23: Cervical and thoracic excursions.    ASSESSMENT:  CLINICAL IMPRESSION: Added manualt to treatment as pt has significant spasm and pain.  Manual completed to improve ROM and decrease pain.   Added cervical and thoracic excursions with Noted improvement in ROM.  PT continues to have increased pain, decreased ROM, decreased strength and decreased activity tolerance and will benefit from skilled PT.    EVAL: Patient is a 57 y.o. female who was seen today for physical therapy evaluation and treatment for S/P anterior cervical fusion. Patient had cervical fusion further defined by difficulty with driving and lifting due to pain, weakness, and decreased soft tissue extensibility and ROM. Skilled PT is required to address the impairments and functional limitations listed below.    OBJECTIVE IMPAIRMENTS: decreased activity tolerance, decreased balance, decreased mobility, difficulty walking, decreased ROM, decreased strength, impaired flexibility, and pain.   ACTIVITY LIMITATIONS: carrying, lifting, bending, standing, and squatting  PARTICIPATION  LIMITATIONS: meal prep, cleaning, laundry, driving, and community activity  PERSONAL FACTORS: 1-2 comorbidities: Migraine, Fibromyalgia  are also affecting patient's functional outcome.   REHAB POTENTIAL: Fair    CLINICAL DECISION MAKING:  Evolving/moderate complexity  EVALUATION COMPLEXITY: Moderate   GOALS: Goals reviewed with patient? Yes  SHORT TERM GOALS: Target date: 04/25/23  Pt will demonstrate indep in HEP to facilitate carry-over of skilled services and improve functional outcomes  LONG TERM GOALS: Target date: 05/16/23  Pt will demonstrate a decrease in NDI score by 19 % to demonstrate significant improvement in ADLs  Baseline: 30% Goal status: ongoing  2.  Patient will demonstrate increase in UE strength to 4 to facilitate ease in ADLs  Baseline: 3+ Goal status: ongoing  3.  Pt will demonstrate increase in LE strength to 4+ to facilitate ease and safety in ambulation  Baseline: 3+ Goal status:ongoing  4.  Pt will increase by at least 40 ft in order to demonstrate clinically significant improvement in community ambulation  Baseline: 276 ft Goal status: ongoing  5.  Pt will have a decrease in TUG score by at least 3 sec in order to demonstrate clinically significant improvement in community ambulation  Baseline: 13.16 sec Goal status: ongoing  6.  Pt will decrease 5TSTS by at least 3 seconds in order to demonstrate clinically significant improvement in LE strength   Baseline: 30.05 sec Goal status: ongoing   PLAN:  PT FREQUENCY: 2x/week  PT DURATION: 6 weeks  PLANNED INTERVENTIONS: Therapeutic exercises, Therapeutic activity, Neuromuscular re-education, Balance training, Gait training, Patient/Family education, Self Care, Dry Needling, Cryotherapy, Moist heat, and Manual therapy  PLAN FOR NEXT SESSION: Continue POC and may progress as tolerated with emphasis on gentle cervical stabilization and balance exercises per protocol and as  tolerated. Virgina Organ, PT CLT [phone]

## 2023-04-25 ENCOUNTER — Telehealth (HOSPITAL_COMMUNITY): Payer: Self-pay

## 2023-04-25 ENCOUNTER — Encounter (HOSPITAL_COMMUNITY): Payer: Medicaid Other

## 2023-04-25 NOTE — Telephone Encounter (Signed)
Called patient today for her 1st no show. Patient did not answer and left a voice message. Informed patient about the facility's policies on cancellation/no shows and that she can call if she has questions. Also informed patient of her future appointment.  Tish Frederickson. Rosella Crandell, PT, DPT, OCS Board-Certified Clinical Specialist in Orthopedic PT PT Compact Privilege # ([place]): [phone]

## 2023-04-29 ENCOUNTER — Ambulatory Visit (HOSPITAL_COMMUNITY): Payer: Medicaid Other

## 2023-04-29 DIAGNOSIS — R29898 Other symptoms and signs involving the musculoskeletal system: Secondary | ICD-10-CM

## 2023-04-29 DIAGNOSIS — R262 Difficulty in walking, not elsewhere classified: Secondary | ICD-10-CM

## 2023-04-29 DIAGNOSIS — M542 Cervicalgia: Secondary | ICD-10-CM | POA: Diagnosis not present

## 2023-04-29 NOTE — Therapy (Signed)
OUTPATIENT PHYSICAL THERAPY CERVICAL TREATMENT   Patient Name: ALISSHA SHIRES MRN: [id number] DOB:[birth year], 57 y.o., female Today's Date: 04/29/2023  END OF SESSION:  PT End of Session - 04/29/23 1616     Visit Number 4    Number of Visits 12    Date for PT Re-Evaluation 05/16/23    Authorization Type [place] Medicaid Healthy (approved 12 visits)    Authorization Time Period 04/04/23-07/02/23    Authorization - Visit Number 3    Authorization - Number of Visits 10    Progress Note Due on Visit 10    PT Start Time 1610    PT Stop Time 1645    PT Time Calculation (min) 35 min    Activity Tolerance Patient tolerated treatment well    Behavior During Therapy [facility] for tasks assessed/performed             Past Medical History:  Diagnosis Date   CAD (coronary artery disease)    a. s/p NSTEMI in 06/2021 with DES to mid-RCA. Residual disease along D1 and 1st Mrg with medical management recommended.   Essential hypertension    Fibromyalgia    Generalized headaches    History of stroke    Noted incidentally by brain MRI July 2022   Mitral regurgitation    a. moderate to severe by echo in 06/2021   Polycythemia    PVC's (premature ventricular contractions)    Renal insufficiency    Sleep apnea    Stroke [facility])    Past Surgical History:  Procedure Laterality Date   ANKLE SURGERY Right    as a child   ANTERIOR CERVICAL DECOMP/DISCECTOMY FUSION N/A 02/06/2023   Procedure: Cervical Five-Cervical Six, Cervical Six-Cervical Seven. Anterior Cervical Decompression/Discectomy Fusion;  Surgeon: [name], MD;  Location: [facility] OR;  Service: Neurosurgery;  Laterality: N/A;   APPENDECTOMY     BREAST BIOPSY Right 2015   Fibroadenoma   CHOLECYSTECTOMY     CORONARY STENT INTERVENTION N/A 07/06/2021   Procedure: CORONARY STENT INTERVENTION;  Surgeon: Tonny Bollman, MD;  Location: [facility] INVASIVE CV LAB;  Service: Cardiovascular;  Laterality: N/A;   DRUG INDUCED ENDOSCOPY Bilateral  08/06/2022   Procedure: DRUG INDUCED ENDOSCOPY;  Surgeon: Christia Reading, MD;  Location: [facility];  Service: ENT;  Laterality: Bilateral;   LEFT HEART CATH AND CORONARY ANGIOGRAPHY N/A 07/06/2021   Procedure: LEFT HEART CATH AND CORONARY ANGIOGRAPHY;  Surgeon: Tonny Bollman, MD;  Location: [facility] INVASIVE CV LAB;  Service: Cardiovascular;  Laterality: N/A;   TONSILLECTOMY     Patient Active Problem List   Diagnosis Date Noted   Hemorrhoids 08/21/2021   Constipation 08/21/2021   Rectocele 08/21/2021   SUI (stress urinary incontinence, female) 08/21/2021   Vaginal pain 08/21/2021   PVC's (premature ventricular contractions) 07/07/2021   Tobacco use 07/06/2021   Mitral valve disorder 07/06/2021   NSTEMI (non-ST elevated myocardial infarction) (HCC) 07/06/2021   Other seasonal allergic rhinitis 05/14/2021   Claustrophobia 09/26/2020   Atrophic vaginitis 09/13/2020   Family history of muscular dystrophy 09/13/2020   Fibromyalgia 09/13/2020   Hypertensive disorder 09/13/2020   Irregular heart beat 09/13/2020   Mixed hyperlipidemia 09/13/2020   Nephrosclerosis 09/13/2020   Obesity with body mass index 30 or greater 09/13/2020   Sinusitis 09/13/2020   Vitamin D deficiency 09/13/2020   Difficulty with CPAP full face mask use 06/06/2020   Hepatic steatosis 06/06/2020   Blurry vision 06/03/2020   Difficulty walking 06/03/2020   Dizziness 06/03/2020   Headache  disorder 06/03/2020   Obstructive sleep apnea syndrome 03/28/2020   Complex renal cyst 03/16/2020   Tubulovillous adenoma of colon 12/14/2019   Blood on toilet paper 11/11/2019   Encounter for screening colonoscopy 11/11/2019   Hematochezia 11/11/2019   Family history of cancer 10/19/2019   Polycythemia 10/19/2019   Facial weakness 02/16/2019   Migraine 02/16/2019    [facility]: [facility]  REFERRING PROVIDER: [name], MD  REFERRING DIAG: [phone] (ICD-10-CM) - Spinal stenosis,  cervical region  THERAPY DIAG:  Neck pain  Weakness of both upper extremities  Difficulty in walking, not elsewhere classified  Rationale for Evaluation and Treatment: Rehabilitation  ONSET DATE: C5-C7 anterior cervical decompression/discectomy fusion 02/06/23  SUBJECTIVE:                                                                                                                                                                                                         SUBJECTIVE STATEMENT: Patient reports of a decrease in neck pain = 3-4/10. Patient reports that her HEP is at times bothering her especially on her "bad" days. Patient is almost around 10 minutes late today.  EVAL: Arrives to the clinic s/p C5-C7 anterior cervical decompression/discectomy fusion 02/06/23. Patient reports that the surgery did not help with her HA. Patient has some issues with her grip and lifting. Denies any pain on the neck and will only c/o pain on occasion (see below). Patient also reports of occasional numbness on the B fingers. Patient had to have cervical fusion due to a deteriorated disc that pressed on her spinal cord. Patient was in a collar for 5 weeks. Recent MD consultation referred her to outpatient PT evaluation and management. Hand dominance: Right  PERTINENT HISTORY:  Fibromyalgia, Migraine  PAIN:  Are you having pain? Yes: NPRS scale: 4/10 Pain location: neck shoulders, legs, and arms Pain description: uncomfortable (cannot be described) Aggravating factors: movement (unknown direction) Relieving factors: relaxing showers  PRECAUTIONS: Other: no lifting > 5 lbs  RED FLAGS: Bowel or bladder incontinence: No, Spinal tumors: No, Cauda equina syndrome: No, and Compression fracture: No     WEIGHT BEARING RESTRICTIONS: No  FALLS:  Has patient fallen in last 6 months? Yes. Number of falls 5x (2 weeks ago was the latest) because she loses balance and her legs feel weak.  LIVING  ENVIRONMENT: Lives with: lives with their son and lives with grandson Lives in: House/apartment Stairs: No Has following equipment at home: Ramped entry  OCCUPATION: Lawyer (part-time)  PLOF: Independent and Independent with basic ADLs  PATIENT  GOALS: "hopefully no problems with mobility"  NEXT MD VISIT: December 2024  OBJECTIVE:   DIAGNOSTIC FINDINGS:  02/06/23 EXAM: DG CERVICAL SPINE - 1 VIEW   COMPARISON:  Cervical spine radiographs 12/31/2021 at [facility] neurosurgery.   FLUOROSCOPY: Exposure Index (as provided by the fluoroscopic device): 9.29   FINDINGS: 2 intraoperative images demonstrate disc spacers at C5-6 and C6-7. Anterior plate and screw fixation is present at both levels. Alignment is anatomic. Hardware is intact.   IMPRESSION: Anterior cervical discectomy and fusion at C5-6 and C6-7.  PATIENT SURVEYS:  NDI 15/50 = 30%  COGNITION: Overall cognitive status: Within functional limits for tasks assessed  SENSATION: Intact on B fingers and L LE, impaired on R LE  POSTURE: rounded shoulders and forward head  PALPATION: Grade 1 tenderness and moderate muscles spasm on B paracervicals, upper and middle rhomboids   CERVICAL ROM:   Active ROM A/PROM (deg) eval  Flexion 5  Extension 5  Right lateral flexion   Left lateral flexion   Right rotation 10  Left rotation 20   (Blank rows = not tested)  UPPER EXTREMITY ROM:  Active ROM Right eval Left eval  Shoulder flexion [facility]  Shoulder extension    Shoulder abduction [facility] WFL  Shoulder adduction [facility]  Shoulder extension    Shoulder internal rotation    Shoulder external rotation    Elbow flexion WFL WFL  Elbow extension [facility] WFL  Wrist flexion    Wrist extension    Wrist ulnar deviation    Wrist radial deviation    Wrist pronation    Wrist supination     (Blank rows = not tested)  UPPER EXTREMITY MMT:  MMT Right eval Left eval  Shoulder flexion 4 4  Shoulder  extension    Shoulder abduction 4- 4-  Shoulder adduction    Shoulder extension    Shoulder internal rotation 4 4  Shoulder external rotation 4 4  Middle trapezius    Lower trapezius    Elbow flexion 4 4  Elbow extension 3+ 3+  Wrist flexion    Wrist extension    Wrist ulnar deviation    Wrist radial deviation    Wrist pronation    Wrist supination    Grip strength 3+ 4-   (Blank rows = not tested)  LOWER EXTREMITY MMT:  MMT Right eval Left eval  Hip flexion 3+ 3+  Hip abduction 4- 4-  Hip adduction 4- 4-  Knee flexion 4+ 4+  Knee extension 4 4  Ankle dorsiflexion 4 4  Ankle plantarflexion 4 4   (Blank rows = not tested)   FUNCTIONAL TESTS:  5 times sit to stand: 30.05 sec Timed up and go (TUG): 13.16 sec 2 minute walk test: 276 ft, needs to hold on to the walls on occasion  TODAY'S TREATMENT:  DATE: 04/29/23 UBE, forwards level 1 x 5'  Shoulder shrugs x 3" x 10 x 2 Gentle cervical isometrics in neutral flex/ext, sidebending, rot,  x 3" x 10 on each Standing rows, RTB x 3" x 10 x 2 Standing shoulder horizontal abd/adduction, RTB x 10 x 2 Hip vectors, RTB x 10 x 2 Mini squats x 3" x 10 x 2  04/24/23 Manual to decrease tightness, spasm and pain Cervical excursions x 5 Scapular retraction x 10  Shoulder shrug up back and relax x 5 Thoracic excursions x 5  Cervical isometrics x 10 for sidebend and extension  Retro shoulder circles x 10  Theraband red Scapular retraction x 10 Rows x 10  UBE  level 1 x 4 minutes .   04/17/23 UBE, forward, level 1 x 4' Doorway pec stretches, 60 deg abd x 30" x 3 Seated:  Shoulder shrugs x 3" x 10 x 2  Scapular retraction x 3" x 10 x 2  Shoulder rolls, ant/post x 10 x 2 each  Thoracic rotation x 10 x 2 each  Gentle cervical isometrics in neutral flex/ext, sidebending, rot,  x 3" x 10 on  each Supine:  Cervical retraction x 3" x 10 x 2  Hooklying marches x 10 x 2 Quadruped:  Cat/cow exercise x 10 x 2  04/04/23 Evaluation and patient education   PATIENT EDUCATION:  Education details: Written HEP updated, provided, and reviewed Person educated: Patient Education method: Explanation Education comprehension: verbalized understanding  HOME EXERCISE PROGRAM: Access Code: [email or web address] URL: [email or web address] 04/29/2023 - Standing 3-Way Leg Reach with Resistance at Ankles and Unilateral Counter Support  - 1-2 x daily - 7 x weekly - 2 sets - 10 reps - Mini Squat with Counter Support  - 1-2 x daily - 7 x weekly - 2 sets - 10 reps - 3 hold  Date: 04/17/2023 Prepared by: Krystal Clark  Exercises - Seated Shoulder Shrug  - 1-2 x daily - 7 x weekly - 2 sets - 10 reps - 3 hold - Seated Scapular Retraction  - 1-2 x daily - 7 x weekly - 2 sets - 10 reps - 3 hold - Shoulder Rolls in Sitting  - 1 x daily - 7 x weekly - 2 sets - 10 reps - Seated Trunk Rotation  - 1-2 x daily - 7 x weekly - 2 sets - 10 reps - Supine Cervical Retraction with Towel  - 1-2 x daily - 7 x weekly - 2 sets - 10 reps - 3 hold - Seated Isometric Cervical Sidebending  - 1-2 x daily - 7 x weekly - 2 sets - 10 reps - 3 hold - Seated Isometric Cervical Extension  - 1-2 x daily - 7 x weekly - 2 sets - 10 reps - 3 hold - Seated Isometric Cervical Flexion  - 1-2 x daily - 7 x weekly - 2 sets - 10 reps - 3 hold - Cat Cow  - 1-2 x daily - 7 x weekly - 2 sets - 10 reps - Supine March  - 1-2 x daily - 7 x weekly - 2 sets - 10 reps - Doorway Pec Stretch at 60 Degrees Abduction with Arm Straight  - 1-2 x daily - 7 x weekly - 3 reps - 30 hold 04/24/23: Cervical and thoracic excursions.    ASSESSMENT:  CLINICAL IMPRESSION: Patient is on her 11 weeks post-op. Interventions today were geared towards improving cervical and upper back strength as well as LE strength. Tolerated  all activities without worsening  of symptoms. Demonstrated appropriate levels of fatigue. Provided mild amount of cueing to ensure correct execution of activity with fair to good carry-over especially with LE exercises due to weakness. To date, skilled PT is required to address the impairments and improve function.   EVAL: Patient is a 56 y.o. female who was seen today for physical therapy evaluation and treatment for S/P anterior cervical fusion. Patient had cervical fusion further defined by difficulty with driving and lifting due to pain, weakness, and decreased soft tissue extensibility and ROM. Skilled PT is required to address the impairments and functional limitations listed below.    OBJECTIVE IMPAIRMENTS: decreased activity tolerance, decreased balance, decreased mobility, difficulty walking, decreased ROM, decreased strength, impaired flexibility, and pain.   ACTIVITY LIMITATIONS: carrying, lifting, bending, standing, and squatting  PARTICIPATION LIMITATIONS: meal prep, cleaning, laundry, driving, and community activity  PERSONAL FACTORS: 1-2 comorbidities: Migraine, Fibromyalgia  are also affecting patient's functional outcome.   REHAB POTENTIAL: Fair    CLINICAL DECISION MAKING: Evolving/moderate complexity  EVALUATION COMPLEXITY: Moderate   GOALS: Goals reviewed with patient? Yes  SHORT TERM GOALS: Target date: 04/25/23  Pt will demonstrate indep in HEP to facilitate carry-over of skilled services and improve functional outcomes  LONG TERM GOALS: Target date: 05/16/23  Pt will demonstrate a decrease in NDI score by 19 % to demonstrate significant improvement in ADLs  Baseline: 30% Goal status: ongoing  2.  Patient will demonstrate increase in UE strength to 4 to facilitate ease in ADLs  Baseline: 3+ Goal status: ongoing  3.  Pt will demonstrate increase in LE strength to 4+ to facilitate ease and safety in ambulation  Baseline: 3+ Goal status:ongoing  4.  Pt will increase by at least 40 ft  in order to demonstrate clinically significant improvement in community ambulation  Baseline: 276 ft Goal status: ongoing  5.  Pt will have a decrease in TUG score by at least 3 sec in order to demonstrate clinically significant improvement in community ambulation  Baseline: 13.16 sec Goal status: ongoing  6.  Pt will decrease 5TSTS by at least 3 seconds in order to demonstrate clinically significant improvement in LE strength   Baseline: 30.05 sec Goal status: ongoing   PLAN:  PT FREQUENCY: 2x/week  PT DURATION: 6 weeks  PLANNED INTERVENTIONS: Therapeutic exercises, Therapeutic activity, Neuromuscular re-education, Balance training, Gait training, Patient/Family education, Self Care, Dry Needling, Cryotherapy, Moist heat, and Manual therapy  PLAN FOR NEXT SESSION: Continue POC and may progress as tolerated with emphasis on gentle cervical stabilization and balance exercises per protocol and as tolerated.  Tish Frederickson. Jalyn Dutta, PT, DPT, OCS Board-Certified Clinical Specialist in Orthopedic PT PT Compact Privilege # ([place]): [phone] T

## 2023-05-01 ENCOUNTER — Encounter (HOSPITAL_COMMUNITY): Payer: Medicaid Other

## 2023-05-02 ENCOUNTER — Telehealth (HOSPITAL_COMMUNITY): Payer: Self-pay

## 2023-05-02 NOTE — Telephone Encounter (Signed)
Called patient 05/01/2023 at around 3:55 pm to follow up with her 3:15 pm appointment. Patient reported that she canceled her appointment for that day. However, records show that appointment was not canceled.  Tish Frederickson. Demarkus Remmel, PT, DPT, OCS Board-Certified Clinical Specialist in Orthopedic PT PT Compact Privilege # ([place]): [phone] T

## 2023-05-06 ENCOUNTER — Encounter (HOSPITAL_COMMUNITY): Payer: Medicaid Other

## 2023-05-07 ENCOUNTER — Ambulatory Visit (HOSPITAL_COMMUNITY): Payer: Medicaid Other

## 2023-05-07 DIAGNOSIS — M542 Cervicalgia: Secondary | ICD-10-CM

## 2023-05-07 DIAGNOSIS — R29898 Other symptoms and signs involving the musculoskeletal system: Secondary | ICD-10-CM

## 2023-05-07 DIAGNOSIS — R262 Difficulty in walking, not elsewhere classified: Secondary | ICD-10-CM

## 2023-05-07 NOTE — Therapy (Signed)
OUTPATIENT PHYSICAL THERAPY CERVICAL TREATMENT   Patient Name: Anita Michael MRN: [id number] DOB:[birth year], 57 y.o., female Today's Date: 05/07/2023  END OF SESSION:  PT End of Session - 05/07/23 1305     Visit Number 5    Number of Visits 12    Date for PT Re-Evaluation 05/16/23    Authorization Type [place] Medicaid Healthy (approved 12 visits)    Authorization Time Period 04/04/23-07/02/23    Authorization - Visit Number 4    Authorization - Number of Visits 10    Progress Note Due on Visit 10    PT Start Time 1300    PT Stop Time 1340    PT Time Calculation (min) 40 min    Activity Tolerance Patient tolerated treatment well    Behavior During Therapy [facility] for tasks assessed/performed              Past Medical History:  Diagnosis Date   CAD (coronary artery disease)    a. s/p NSTEMI in 06/2021 with DES to mid-RCA. Residual disease along D1 and 1st Mrg with medical management recommended.   Essential hypertension    Fibromyalgia    Generalized headaches    History of stroke    Noted incidentally by brain MRI July 2022   Mitral regurgitation    a. moderate to severe by echo in 06/2021   Polycythemia    PVC's (premature ventricular contractions)    Renal insufficiency    Sleep apnea    Stroke [facility])    Past Surgical History:  Procedure Laterality Date   ANKLE SURGERY Right    as a child   ANTERIOR CERVICAL DECOMP/DISCECTOMY FUSION N/A 02/06/2023   Procedure: Cervical Five-Cervical Six, Cervical Six-Cervical Seven. Anterior Cervical Decompression/Discectomy Fusion;  Surgeon: [name], MD;  Location: [facility] OR;  Service: Neurosurgery;  Laterality: N/A;   APPENDECTOMY     BREAST BIOPSY Right 2015   Fibroadenoma   CHOLECYSTECTOMY     CORONARY STENT INTERVENTION N/A 07/06/2021   Procedure: CORONARY STENT INTERVENTION;  Surgeon: Tonny Bollman, MD;  Location: [facility] INVASIVE CV LAB;  Service: Cardiovascular;  Laterality: N/A;   DRUG INDUCED ENDOSCOPY Bilateral  08/06/2022   Procedure: DRUG INDUCED ENDOSCOPY;  Surgeon: Christia Reading, MD;  Location: [facility];  Service: ENT;  Laterality: Bilateral;   LEFT HEART CATH AND CORONARY ANGIOGRAPHY N/A 07/06/2021   Procedure: LEFT HEART CATH AND CORONARY ANGIOGRAPHY;  Surgeon: Tonny Bollman, MD;  Location: [facility] INVASIVE CV LAB;  Service: Cardiovascular;  Laterality: N/A;   TONSILLECTOMY     Patient Active Problem List   Diagnosis Date Noted   Hemorrhoids 08/21/2021   Constipation 08/21/2021   Rectocele 08/21/2021   SUI (stress urinary incontinence, female) 08/21/2021   Vaginal pain 08/21/2021   PVC's (premature ventricular contractions) 07/07/2021   Tobacco use 07/06/2021   Mitral valve disorder 07/06/2021   NSTEMI (non-ST elevated myocardial infarction) (HCC) 07/06/2021   Other seasonal allergic rhinitis 05/14/2021   Claustrophobia 09/26/2020   Atrophic vaginitis 09/13/2020   Family history of muscular dystrophy 09/13/2020   Fibromyalgia 09/13/2020   Hypertensive disorder 09/13/2020   Irregular heart beat 09/13/2020   Mixed hyperlipidemia 09/13/2020   Nephrosclerosis 09/13/2020   Obesity with body mass index 30 or greater 09/13/2020   Sinusitis 09/13/2020   Vitamin D deficiency 09/13/2020   Difficulty with CPAP full face mask use 06/06/2020   Hepatic steatosis 06/06/2020   Blurry vision 06/03/2020   Difficulty walking 06/03/2020   Dizziness 06/03/2020  Headache disorder 06/03/2020   Obstructive sleep apnea syndrome 03/28/2020   Complex renal cyst 03/16/2020   Tubulovillous adenoma of colon 12/14/2019   Blood on toilet paper 11/11/2019   Encounter for screening colonoscopy 11/11/2019   Hematochezia 11/11/2019   Family history of cancer 10/19/2019   Polycythemia 10/19/2019   Facial weakness 02/16/2019   Migraine 02/16/2019    [facility]P: [facility]  REFERRING PROVIDER: [name], MD  REFERRING DIAG: [phone] (ICD-10-CM) - Spinal stenosis,  cervical region  THERAPY DIAG:  Neck pain  Weakness of both upper extremities  Difficulty in walking, not elsewhere classified  Rationale for Evaluation and Treatment: Rehabilitation  ONSET DATE: C5-C7 anterior cervical decompression/discectomy fusion 02/06/23  SUBJECTIVE:                                                                                                                                                                                                         SUBJECTIVE STATEMENT: Doing well today. Pain is at 4/10. Patient reports that she was sore from the last session. Patient states that her MD has cleared her to move her neck in any direction she wants.  EVAL: Arrives to the clinic s/p C5-C7 anterior cervical decompression/discectomy fusion 02/06/23. Patient reports that the surgery did not help with her HA. Patient has some issues with her grip and lifting. Denies any pain on the neck and will only c/o pain on occasion (see below). Patient also reports of occasional numbness on the B fingers. Patient had to have cervical fusion due to a deteriorated disc that pressed on her spinal cord. Patient was in a collar for 5 weeks. Recent MD consultation referred her to outpatient PT evaluation and management. Hand dominance: Right  PERTINENT HISTORY:  Fibromyalgia, Migraine  PAIN:  Are you having pain? Yes: NPRS scale: 4/10 Pain location: neck shoulders, legs, and arms Pain description: uncomfortable (cannot be described) Aggravating factors: movement (unknown direction) Relieving factors: relaxing showers  PRECAUTIONS: Other: no lifting > 5 lbs  RED FLAGS: Bowel or bladder incontinence: No, Spinal tumors: No, Cauda equina syndrome: No, and Compression fracture: No     WEIGHT BEARING RESTRICTIONS: No  FALLS:  Has patient fallen in last 6 months? Yes. Number of falls 5x (2 weeks ago was the latest) because she loses balance and her legs feel weak.  LIVING  ENVIRONMENT: Lives with: lives with their son and lives with grandson Lives in: House/apartment Stairs: No Has following equipment at home: Ramped entry  OCCUPATION: Lawyer (part-time)  PLOF: Independent and Independent with basic ADLs  PATIENT GOALS: "hopefully no problems with mobility"  NEXT MD VISIT: December 2024  OBJECTIVE:   DIAGNOSTIC FINDINGS:  02/06/23 EXAM: DG CERVICAL SPINE - 1 VIEW   COMPARISON:  Cervical spine radiographs 12/31/2021 at [facility] neurosurgery.   FLUOROSCOPY: Exposure Index (as provided by the fluoroscopic device): 9.29   FINDINGS: 2 intraoperative images demonstrate disc spacers at C5-6 and C6-7. Anterior plate and screw fixation is present at both levels. Alignment is anatomic. Hardware is intact.   IMPRESSION: Anterior cervical discectomy and fusion at C5-6 and C6-7.  PATIENT SURVEYS:  NDI 15/50 = 30%  COGNITION: Overall cognitive status: Within functional limits for tasks assessed  SENSATION: Intact on B fingers and L LE, impaired on R LE  POSTURE: rounded shoulders and forward head  PALPATION: Grade 1 tenderness and moderate muscles spasm on B paracervicals, upper and middle rhomboids   CERVICAL ROM:   Active ROM A/PROM (deg) eval  Flexion 5  Extension 5  Right lateral flexion   Left lateral flexion   Right rotation 10  Left rotation 20   (Blank rows = not tested)  UPPER EXTREMITY ROM:  Active ROM Right eval Left eval  Shoulder flexion [facility]  Shoulder extension    Shoulder abduction [facility] WFL  Shoulder adduction [facility]  Shoulder extension    Shoulder internal rotation    Shoulder external rotation    Elbow flexion WFL WFL  Elbow extension [facility] WFL  Wrist flexion    Wrist extension    Wrist ulnar deviation    Wrist radial deviation    Wrist pronation    Wrist supination     (Blank rows = not tested)  UPPER EXTREMITY MMT:  MMT Right eval Left eval  Shoulder flexion 4 4  Shoulder  extension    Shoulder abduction 4- 4-  Shoulder adduction    Shoulder extension    Shoulder internal rotation 4 4  Shoulder external rotation 4 4  Middle trapezius    Lower trapezius    Elbow flexion 4 4  Elbow extension 3+ 3+  Wrist flexion    Wrist extension    Wrist ulnar deviation    Wrist radial deviation    Wrist pronation    Wrist supination    Grip strength 3+ 4-   (Blank rows = not tested)  LOWER EXTREMITY MMT:  MMT Right eval Left eval  Hip flexion 3+ 3+  Hip abduction 4- 4-  Hip adduction 4- 4-  Knee flexion 4+ 4+  Knee extension 4 4  Ankle dorsiflexion 4 4  Ankle plantarflexion 4 4   (Blank rows = not tested)   FUNCTIONAL TESTS:  5 times sit to stand: 30.05 sec Timed up and go (TUG): 13.16 sec 2 minute walk test: 276 ft, needs to hold on to the walls on occasion  TODAY'S TREATMENT:  DATE: 05/07/23 UBE, forward, level 2 x 5'  Supine hugs, 1 pillow on back x 20 Supine cervical rotation within pain-free range on a pillow x 3" x 10 Supine cervical retraction on a pillow x 3" x 10 x 2 Doorway pec stretch 60 deg abd x 30" x 3 I, T exercises x green TB x 3" x 10 x 2 Pulleys flexion x 1'  04/29/23 UBE, forwards level 1 x 5'  Shoulder shrugs x 3" x 10 x 2 Gentle cervical isometrics in neutral flex/ext, sidebending, rot,  x 3" x 10 on each Standing rows, RTB x 3" x 10 x 2 Standing shoulder horizontal abd/adduction, RTB x 10 x 2 Hip vectors, RTB x 10 x 2 Mini squats x 3" x 10 x 2  04/24/23 Manual to decrease tightness, spasm and pain Cervical excursions x 5 Scapular retraction x 10  Shoulder shrug up back and relax x 5 Thoracic excursions x 5  Cervical isometrics x 10 for sidebend and extension  Retro shoulder circles x 10  Theraband red Scapular retraction x 10 Rows x 10  UBE  level 1 x 4 minutes .   04/17/23 UBE, forward,  level 1 x 4' Doorway pec stretches, 60 deg abd x 30" x 3 Seated:  Shoulder shrugs x 3" x 10 x 2  Scapular retraction x 3" x 10 x 2  Shoulder rolls, ant/post x 10 x 2 each  Thoracic rotation x 10 x 2 each  Gentle cervical isometrics in neutral flex/ext, sidebending, rot,  x 3" x 10 on each Supine:  Cervical retraction x 3" x 10 x 2  Hooklying marches x 10 x 2 Quadruped:  Cat/cow exercise x 10 x 2  04/04/23 Evaluation and patient education   PATIENT EDUCATION:  Education details: Written HEP updated, provided, and reviewed Person educated: Patient Education method: Explanation Education comprehension: verbalized understanding  HOME EXERCISE PROGRAM: Access Code: [email or web address] URL: [email or web address]  04/29/2023 - Standing 3-Way Leg Reach with Resistance at Ankles and Unilateral Counter Support  - 1-2 x daily - 7 x weekly - 2 sets - 10 reps - Mini Squat with Counter Support  - 1-2 x daily - 7 x weekly - 2 sets - 10 reps - 3 hold  04/24/23: Cervical and thoracic excursions.     Date: 04/17/2023 Prepared by: Krystal Clark  Exercises - Seated Shoulder Shrug  - 1-2 x daily - 7 x weekly - 2 sets - 10 reps - 3 hold - Seated Scapular Retraction  - 1-2 x daily - 7 x weekly - 2 sets - 10 reps - 3 hold - Shoulder Rolls in Sitting  - 1 x daily - 7 x weekly - 2 sets - 10 reps - Seated Trunk Rotation  - 1-2 x daily - 7 x weekly - 2 sets - 10 reps - Supine Cervical Retraction with Towel  - 1-2 x daily - 7 x weekly - 2 sets - 10 reps - 3 hold - Seated Isometric Cervical Sidebending  - 1-2 x daily - 7 x weekly - 2 sets - 10 reps - 3 hold - Seated Isometric Cervical Extension  - 1-2 x daily - 7 x weekly - 2 sets - 10 reps - 3 hold - Seated Isometric Cervical Flexion  - 1-2 x daily - 7 x weekly - 2 sets - 10 reps - 3 hold - Cat Cow  - 1-2 x daily - 7 x weekly - 2 sets - 10 reps - Supine  March  - 1-2 x daily - 7 x weekly - 2 sets - 10 reps - Doorway Pec Stretch at 60 Degrees  Abduction with Arm Straight  - 1-2 x daily - 7 x weekly - 3 reps - 30 hold .    ASSESSMENT:  CLINICAL IMPRESSION: Patient is on her 12 weeks post-op. Interventions today were geared towards improving cervical and upper back strength. Tolerated all activities without worsening of symptoms. Demonstrated appropriate levels of fatigue. Provided mild amount of cueing to ensure correct execution of activity with good carry-over especially with LE exercises due to weakness. To date, skilled PT is required to address the impairments and improve function.   EVAL: Patient is a 57 y.o. female who was seen today for physical therapy evaluation and treatment for S/P anterior cervical fusion. Patient had cervical fusion further defined by difficulty with driving and lifting due to pain, weakness, and decreased soft tissue extensibility and ROM. Skilled PT is required to address the impairments and functional limitations listed below.    OBJECTIVE IMPAIRMENTS: decreased activity tolerance, decreased balance, decreased mobility, difficulty walking, decreased ROM, decreased strength, impaired flexibility, and pain.   ACTIVITY LIMITATIONS: carrying, lifting, bending, standing, and squatting  PARTICIPATION LIMITATIONS: meal prep, cleaning, laundry, driving, and community activity  PERSONAL FACTORS: 1-2 comorbidities: Migraine, Fibromyalgia  are also affecting patient's functional outcome.   REHAB POTENTIAL: Fair    CLINICAL DECISION MAKING: Evolving/moderate complexity  EVALUATION COMPLEXITY: Moderate   GOALS: Goals reviewed with patient? Yes  SHORT TERM GOALS: Target date: 04/25/23  Pt will demonstrate indep in HEP to facilitate carry-over of skilled services and improve functional outcomes  LONG TERM GOALS: Target date: 05/16/23  Pt will demonstrate a decrease in NDI score by 19 % to demonstrate significant improvement in ADLs  Baseline: 30% Goal status: ongoing  2.  Patient will demonstrate  increase in UE strength to 4 to facilitate ease in ADLs  Baseline: 3+ Goal status: ongoing  3.  Pt will demonstrate increase in LE strength to 4+ to facilitate ease and safety in ambulation  Baseline: 3+ Goal status:ongoing  4.  Pt will increase by at least 40 ft in order to demonstrate clinically significant improvement in community ambulation  Baseline: 276 ft Goal status: ongoing  5.  Pt will have a decrease in TUG score by at least 3 sec in order to demonstrate clinically significant improvement in community ambulation  Baseline: 13.16 sec Goal status: ongoing  6.  Pt will decrease 5TSTS by at least 3 seconds in order to demonstrate clinically significant improvement in LE strength   Baseline: 30.05 sec Goal status: ongoing   PLAN:  PT FREQUENCY: 2x/week  PT DURATION: 6 weeks  PLANNED INTERVENTIONS: Therapeutic exercises, Therapeutic activity, Neuromuscular re-education, Balance training, Gait training, Patient/Family education, Self Care, Dry Needling, Cryotherapy, Moist heat, and Manual therapy  PLAN FOR NEXT SESSION: Continue POC and may progress as tolerated with emphasis on gentle cervical stabilization and balance exercises per protocol and as tolerated.  Tish Frederickson. Colt Martelle, PT, DPT, OCS Board-Certified Clinical Specialist in Orthopedic PT PT Compact Privilege # ([place]): [phone] T

## 2023-05-09 ENCOUNTER — Encounter (HOSPITAL_COMMUNITY): Payer: Medicaid Other

## 2023-05-13 ENCOUNTER — Encounter (HOSPITAL_COMMUNITY): Payer: Medicaid Other

## 2023-05-21 ENCOUNTER — Encounter (HOSPITAL_COMMUNITY): Payer: Medicaid Other

## 2023-05-21 ENCOUNTER — Telehealth (HOSPITAL_COMMUNITY): Payer: Self-pay

## 2023-05-21 ENCOUNTER — Encounter (HOSPITAL_COMMUNITY): Payer: Self-pay

## 2023-05-21 NOTE — Telephone Encounter (Signed)
Patient called the clinic today and was able to speak with Demetria at around 16:08 informing that she will be able to make it for her 16:00 appointment at 16:30 today. Demetria informed patient that it will be too late for her to come at that time.  Tish Frederickson. Maddelyn Rocca, PT, DPT, OCS Board-Certified Clinical Specialist in Orthopedic PT PT Compact Privilege # ([place]): [phone] T

## 2023-05-29 ENCOUNTER — Ambulatory Visit (HOSPITAL_COMMUNITY): Payer: Medicaid Other | Attending: Neurosurgery | Admitting: Physical Therapy

## 2023-05-29 DIAGNOSIS — M542 Cervicalgia: Secondary | ICD-10-CM | POA: Insufficient documentation

## 2023-05-29 DIAGNOSIS — R262 Difficulty in walking, not elsewhere classified: Secondary | ICD-10-CM | POA: Insufficient documentation

## 2023-05-29 DIAGNOSIS — R29898 Other symptoms and signs involving the musculoskeletal system: Secondary | ICD-10-CM | POA: Diagnosis present

## 2023-05-29 NOTE — Therapy (Signed)
OUTPATIENT PHYSICAL THERAPY CERVICAL TREATMENT   Patient Name: Anita Michael MRN: [id number] DOB:06/03/1966, 57 y.o., female Today's Date: 05/29/2023  END OF SESSION:  PT End of Session - 05/29/23 1707     Visit Number 6    Number of Visits 12    Date for PT Re-Evaluation 05/16/23    Authorization Type [place] Medicaid Healthy (approved 12 visits)    Authorization Time Period 04/04/23-07/02/23    Authorization - Visit Number 5    Authorization - Number of Visits 10    Progress Note Due on Visit 10    PT Start Time 1630    PT Stop Time 1655    PT Time Calculation (min) 25 min    Activity Tolerance Patient tolerated treatment well    Behavior During Therapy [facility] for tasks assessed/performed               Past Medical History:  Diagnosis Date   CAD (coronary artery disease)    a. s/p NSTEMI in 06/2021 with DES to mid-RCA. Residual disease along D1 and 1st Mrg with medical management recommended.   Essential hypertension    Fibromyalgia    Generalized headaches    History of stroke    Noted incidentally by brain MRI July 2022   Mitral regurgitation    a. moderate to severe by echo in 06/2021   Polycythemia    PVC's (premature ventricular contractions)    Renal insufficiency    Sleep apnea    Stroke [facility])    Past Surgical History:  Procedure Laterality Date   ANKLE SURGERY Right    as a child   ANTERIOR CERVICAL DECOMP/DISCECTOMY FUSION N/A 02/06/2023   Procedure: Cervical Five-Cervical Six, Cervical Six-Cervical Seven. Anterior Cervical Decompression/Discectomy Fusion;  Surgeon: [name], MD;  Location: [facility] OR;  Service: Neurosurgery;  Laterality: N/A;   APPENDECTOMY     BREAST BIOPSY Right 2015   Fibroadenoma   CHOLECYSTECTOMY     CORONARY STENT INTERVENTION N/A 07/06/2021   Procedure: CORONARY STENT INTERVENTION;  Surgeon: Tonny Bollman, MD;  Location: [facility] INVASIVE CV LAB;  Service: Cardiovascular;  Laterality: N/A;   DRUG INDUCED ENDOSCOPY Bilateral  08/06/2022   Procedure: DRUG INDUCED ENDOSCOPY;  Surgeon: Christia Reading, MD;  Location: [facility];  Service: ENT;  Laterality: Bilateral;   LEFT HEART CATH AND CORONARY ANGIOGRAPHY N/A 07/06/2021   Procedure: LEFT HEART CATH AND CORONARY ANGIOGRAPHY;  Surgeon: Tonny Bollman, MD;  Location: [facility] INVASIVE CV LAB;  Service: Cardiovascular;  Laterality: N/A;   TONSILLECTOMY     Patient Active Problem List   Diagnosis Date Noted   Hemorrhoids 08/21/2021   Constipation 08/21/2021   Rectocele 08/21/2021   SUI (stress urinary incontinence, female) 08/21/2021   Vaginal pain 08/21/2021   PVC's (premature ventricular contractions) 07/07/2021   Tobacco use 07/06/2021   Mitral valve disorder 07/06/2021   NSTEMI (non-ST elevated myocardial infarction) (HCC) 07/06/2021   Other seasonal allergic rhinitis 05/14/2021   Claustrophobia 09/26/2020   Atrophic vaginitis 09/13/2020   Family history of muscular dystrophy 09/13/2020   Fibromyalgia 09/13/2020   Hypertensive disorder 09/13/2020   Irregular heart beat 09/13/2020   Mixed hyperlipidemia 09/13/2020   Nephrosclerosis 09/13/2020   Obesity with body mass index 30 or greater 09/13/2020   Sinusitis 09/13/2020   Vitamin D deficiency 09/13/2020   Difficulty with CPAP full face mask use 06/06/2020   Hepatic steatosis 06/06/2020   Blurry vision 06/03/2020   Difficulty walking 06/03/2020   Dizziness 06/03/2020  Headache disorder 06/03/2020   Obstructive sleep apnea syndrome 03/28/2020   Complex renal cyst 03/16/2020   Tubulovillous adenoma of colon 12/14/2019   Blood on toilet paper 11/11/2019   Encounter for screening colonoscopy 11/11/2019   Hematochezia 11/11/2019   Family history of cancer 10/19/2019   Polycythemia 10/19/2019   Facial weakness 02/16/2019   Migraine 02/16/2019    [facility]: [facility]  REFERRING PROVIDER: [name], MD  REFERRING DIAG: [phone] (ICD-10-CM) - Spinal stenosis,  cervical region  THERAPY DIAG:  Neck pain  Weakness of both upper extremities  Difficulty in walking, not elsewhere classified  Rationale for Evaluation and Treatment: Rehabilitation  ONSET DATE: C5-C7 anterior cervical decompression/discectomy fusion 02/06/23  SUBJECTIVE:                                                                                                                                                                                                         SUBJECTIVE STATEMENT: Pt states that she is very sore today.  States that she has been doing her exercises  EVAL: Arrives to the clinic s/p C5-C7 anterior cervical decompression/discectomy fusion 02/06/23. Patient reports that the surgery did not help with her HA. Patient has some issues with her grip and lifting. Denies any pain on the neck and will only c/o pain on occasion (see below). Patient also reports of occasional numbness on the B fingers. Patient had to have cervical fusion due to a deteriorated disc that pressed on her spinal cord. Patient was in a collar for 5 weeks. Recent MD consultation referred her to outpatient PT evaluation and management. Hand dominance: Right  PERTINENT HISTORY:  Fibromyalgia, Migraine  PAIN:  Are you having pain? Yes: NPRS scale: 10/10 Pain location: neck shoulders, legs, and arms Pain description: uncomfortable (cannot be described) Aggravating factors: movement (unknown direction) Relieving factors: relaxing showers  PRECAUTIONS: Other: no lifting > 5 lbs  RED FLAGS: Bowel or bladder incontinence: No, Spinal tumors: No, Cauda equina syndrome: No, and Compression fracture: No     WEIGHT BEARING RESTRICTIONS: No  FALLS:  Has patient fallen in last 6 months? Yes. Number of falls 5x (2 weeks ago was the latest) because she loses balance and her legs feel weak.  LIVING ENVIRONMENT: Lives with: lives with their son and lives with grandson Lives in: House/apartment Stairs:  No Has following equipment at home: Ramped entry  OCCUPATION: Lawyer (part-time)  PLOF: Independent and Independent with basic ADLs  PATIENT GOALS: "hopefully no problems with mobility"  NEXT MD VISIT: December 2024  OBJECTIVE:  DIAGNOSTIC FINDINGS:  02/06/23 EXAM: DG CERVICAL SPINE - 1 VIEW   COMPARISON:  Cervical spine radiographs 12/31/2021 at [facility] neurosurgery.   FLUOROSCOPY: Exposure Index (as provided by the fluoroscopic device): 9.29   FINDINGS: 2 intraoperative images demonstrate disc spacers at C5-6 and C6-7. Anterior plate and screw fixation is present at both levels. Alignment is anatomic. Hardware is intact.   IMPRESSION: Anterior cervical discectomy and fusion at C5-6 and C6-7.  PATIENT SURVEYS:  NDI 15/50 = 30%  COGNITION: Overall cognitive status: Within functional limits for tasks assessed  SENSATION: Intact on B fingers and L LE, impaired on R LE  POSTURE: rounded shoulders and forward head  PALPATION: Grade 1 tenderness and moderate muscles spasm on B paracervicals, upper and middle rhomboids   CERVICAL ROM:   Active ROM A/PROM (deg) eval  Flexion 5  Extension 5  Right lateral flexion   Left lateral flexion   Right rotation 10  Left rotation 20   (Blank rows = not tested)  UPPER EXTREMITY ROM:  Active ROM Right eval Left eval  Shoulder flexion [facility]  Shoulder extension    Shoulder abduction [facility] WFL  Shoulder adduction [facility]  Shoulder extension    Shoulder internal rotation    Shoulder external rotation    Elbow flexion WFL WFL  Elbow extension [facility] WFL  Wrist flexion    Wrist extension    Wrist ulnar deviation    Wrist radial deviation    Wrist pronation    Wrist supination     (Blank rows = not tested)  UPPER EXTREMITY MMT:  MMT Right eval Left eval  Shoulder flexion 4 4  Shoulder extension    Shoulder abduction 4- 4-  Shoulder adduction    Shoulder extension    Shoulder internal rotation 4  4  Shoulder external rotation 4 4  Middle trapezius    Lower trapezius    Elbow flexion 4 4  Elbow extension 3+ 3+  Wrist flexion    Wrist extension    Wrist ulnar deviation    Wrist radial deviation    Wrist pronation    Wrist supination    Grip strength 3+ 4-   (Blank rows = not tested)  LOWER EXTREMITY MMT:  MMT Right eval Left eval  Hip flexion 3+ 3+  Hip abduction 4- 4-  Hip adduction 4- 4-  Knee flexion 4+ 4+  Knee extension 4 4  Ankle dorsiflexion 4 4  Ankle plantarflexion 4 4   (Blank rows = not tested)   FUNCTIONAL TESTS:  5 times sit to stand: 30.05 sec Timed up and go (TUG): 13.16 sec 2 minute walk test: 276 ft, needs to hold on to the walls on occasion  TODAY'S TREATMENT:                                                                                                                              DATE:05/29/23: Attempted manual but pt  stated this was increasing her pain.   Used HMP while pt was completing the following exercises Scapular retraction x 10 x 2 set  Cervical retraction x 10x 2 set   Rotation in mid range x 10 x 2 set  UE flexion with cervical stabilization x 2 set  Sitting: Shoulder circles retro x 10   PATIENT EDUCATION:  Education details: Written HEP updated, provided, and reviewed Person educated: Patient Education method: Explanation Education comprehension: verbalized understanding  HOME EXERCISE PROGRAM: Access Code: [email or web address] URL: [email or web address]  04/29/2023 - Standing 3-Way Leg Reach with Resistance at Ankles and Unilateral Counter Support  - 1-2 x daily - 7 x weekly - 2 sets - 10 reps - Mini Squat with Counter Support  - 1-2 x daily - 7 x weekly - 2 sets - 10 reps - 3 hold  04/24/23: Cervical and thoracic excursions.     Date: 04/17/2023 Prepared by: Krystal Clark  Exercises - Seated Shoulder Shrug  - 1-2 x daily - 7 x weekly - 2 sets - 10 reps - 3 hold - Seated Scapular Retraction  - 1-2 x daily -  7 x weekly - 2 sets - 10 reps - 3 hold - Shoulder Rolls in Sitting  - 1 x daily - 7 x weekly - 2 sets - 10 reps - Seated Trunk Rotation  - 1-2 x daily - 7 x weekly - 2 sets - 10 reps - Supine Cervical Retraction with Towel  - 1-2 x daily - 7 x weekly - 2 sets - 10 reps - 3 hold - Seated Isometric Cervical Sidebending  - 1-2 x daily - 7 x weekly - 2 sets - 10 reps - 3 hold - Seated Isometric Cervical Extension  - 1-2 x daily - 7 x weekly - 2 sets - 10 reps - 3 hold - Seated Isometric Cervical Flexion  - 1-2 x daily - 7 x weekly - 2 sets - 10 reps - 3 hold - Cat Cow  - 1-2 x daily - 7 x weekly - 2 sets - 10 reps - Supine March  - 1-2 x daily - 7 x weekly - 2 sets - 10 reps - Doorway Pec Stretch at 60 Degrees Abduction with Arm Straight  - 1-2 x daily - 7 x weekly - 3 reps - 30 hold .    ASSESSMENT:  CLINICAL IMPRESSION:  Pt pain is quite exacerbated today.  Treatment is limited by pain which is a 10/10.  Attempted to work on gentle stabilization and stretches to decrease patient pain without significant improvement.  . To date, skilled PT is required to address the impairments and improve function.   EVAL: Patient is a 56 y.o. female who was seen today for physical therapy evaluation and treatment for S/P anterior cervical fusion. Patient had cervical fusion further defined by difficulty with driving and lifting due to pain, weakness, and decreased soft tissue extensibility and ROM. Skilled PT is required to address the impairments and functional limitations listed below.    OBJECTIVE IMPAIRMENTS: decreased activity tolerance, decreased balance, decreased mobility, difficulty walking, decreased ROM, decreased strength, impaired flexibility, and pain.   ACTIVITY LIMITATIONS: carrying, lifting, bending, standing, and squatting  PARTICIPATION LIMITATIONS: meal prep, cleaning, laundry, driving, and community activity  PERSONAL FACTORS: 1-2 comorbidities: Migraine, Fibromyalgia  are also affecting  patient's functional outcome.   REHAB POTENTIAL: Fair    CLINICAL DECISION MAKING: Evolving/moderate complexity  EVALUATION COMPLEXITY: Moderate   GOALS: Goals reviewed with patient? Yes  SHORT TERM GOALS: Target date: 04/25/23  Pt will demonstrate indep in HEP to facilitate carry-over of skilled services and improve functional outcomes  LONG TERM GOALS: Target date: 05/16/23  Pt will demonstrate a decrease in NDI score by 19 % to demonstrate significant improvement in ADLs  Baseline: 30% Goal status: ongoing  2.  Patient will demonstrate increase in UE strength to 4 to facilitate ease in ADLs  Baseline: 3+ Goal status: ongoing  3.  Pt will demonstrate increase in LE strength to 4+ to facilitate ease and safety in ambulation  Baseline: 3+ Goal status:ongoing  4.  Pt will increase by at least 40 ft in order to demonstrate clinically significant improvement in community ambulation  Baseline: 276 ft Goal status: ongoing  5.  Pt will have a decrease in TUG score by at least 3 sec in order to demonstrate clinically significant improvement in community ambulation  Baseline: 13.16 sec Goal status: ongoing  6.  Pt will decrease 5TSTS by at least 3 seconds in order to demonstrate clinically significant improvement in LE strength   Baseline: 30.05 sec Goal status: ongoing   PLAN:  PT FREQUENCY: 2x/week  PT DURATION: 6 weeks  PLANNED INTERVENTIONS: Therapeutic exercises, Therapeutic activity, Neuromuscular re-education, Balance training, Gait training, Patient/Family education, Self Care, Dry Needling, Cryotherapy, Moist heat, and Manual therapy  PLAN FOR NEXT SESSION: Continue POC and may progress as tolerated with emphasis on gentle cervical stabilization and balance exercises per protocol and as tolerated.  Virgina Organ, PT CLT [phone]

## 2023-06-06 ENCOUNTER — Ambulatory Visit: Payer: Medicaid Other | Admitting: Urology

## 2023-06-12 ENCOUNTER — Ambulatory Visit (HOSPITAL_COMMUNITY): Payer: Medicaid Other | Admitting: Physical Therapy

## 2023-06-12 DIAGNOSIS — R29898 Other symptoms and signs involving the musculoskeletal system: Secondary | ICD-10-CM

## 2023-06-12 DIAGNOSIS — M542 Cervicalgia: Secondary | ICD-10-CM

## 2023-06-12 DIAGNOSIS — R262 Difficulty in walking, not elsewhere classified: Secondary | ICD-10-CM

## 2023-06-12 NOTE — Therapy (Signed)
OUTPATIENT PHYSICAL THERAPY CERVICAL TREATMENT   Patient Name: Anita Michael MRN: [id number] DOB:[birth year], 57 y.o., female Today's Date: 06/12/2023  END OF SESSION:  PT End of Session - 06/12/23 1715     Visit Number 7    Number of Visits 12    Date for PT Re-Evaluation 05/16/23    Authorization Type [place] Medicaid Healthy (approved 12 visits)    Authorization Time Period 04/04/23-07/02/23    Authorization - Visit Number 6    Authorization - Number of Visits 10    Progress Note Due on Visit 10    PT Start Time 1653    PT Stop Time 1718    PT Time Calculation (min) 25 min    Activity Tolerance Patient tolerated treatment well    Behavior During Therapy [facility] for tasks assessed/performed                Past Medical History:  Diagnosis Date   CAD (coronary artery disease)    a. s/p NSTEMI in 06/2021 with DES to mid-RCA. Residual disease along D1 and 1st Mrg with medical management recommended.   Essential hypertension    Fibromyalgia    Generalized headaches    History of stroke    Noted incidentally by brain MRI July 2022   Mitral regurgitation    a. moderate to severe by echo in 06/2021   Polycythemia    PVC's (premature ventricular contractions)    Renal insufficiency    Sleep apnea    Stroke [facility])    Past Surgical History:  Procedure Laterality Date   ANKLE SURGERY Right    as a child   ANTERIOR CERVICAL DECOMP/DISCECTOMY FUSION N/A 02/06/2023   Procedure: Cervical Five-Cervical Six, Cervical Six-Cervical Seven. Anterior Cervical Decompression/Discectomy Fusion;  Surgeon: [name], MD;  Location: [facility] OR;  Service: Neurosurgery;  Laterality: N/A;   APPENDECTOMY     BREAST BIOPSY Right 2015   Fibroadenoma   CHOLECYSTECTOMY     CORONARY STENT INTERVENTION N/A 07/06/2021   Procedure: CORONARY STENT INTERVENTION;  Surgeon: Tonny Bollman, MD;  Location: [facility] INVASIVE CV LAB;  Service: Cardiovascular;  Laterality: N/A;   DRUG INDUCED ENDOSCOPY Bilateral  08/06/2022   Procedure: DRUG INDUCED ENDOSCOPY;  Surgeon: Christia Reading, MD;  Location: [facility];  Service: ENT;  Laterality: Bilateral;   LEFT HEART CATH AND CORONARY ANGIOGRAPHY N/A 07/06/2021   Procedure: LEFT HEART CATH AND CORONARY ANGIOGRAPHY;  Surgeon: Tonny Bollman, MD;  Location: [facility] INVASIVE CV LAB;  Service: Cardiovascular;  Laterality: N/A;   TONSILLECTOMY     Patient Active Problem List   Diagnosis Date Noted   Hemorrhoids 08/21/2021   Constipation 08/21/2021   Rectocele 08/21/2021   SUI (stress urinary incontinence, female) 08/21/2021   Vaginal pain 08/21/2021   PVC's (premature ventricular contractions) 07/07/2021   Tobacco use 07/06/2021   Mitral valve disorder 07/06/2021   NSTEMI (non-ST elevated myocardial infarction) (HCC) 07/06/2021   Other seasonal allergic rhinitis 05/14/2021   Claustrophobia 09/26/2020   Atrophic vaginitis 09/13/2020   Family history of muscular dystrophy 09/13/2020   Fibromyalgia 09/13/2020   Hypertensive disorder 09/13/2020   Irregular heart beat 09/13/2020   Mixed hyperlipidemia 09/13/2020   Nephrosclerosis 09/13/2020   Obesity with body mass index 30 or greater 09/13/2020   Sinusitis 09/13/2020   Vitamin D deficiency 09/13/2020   Difficulty with CPAP full face mask use 06/06/2020   Hepatic steatosis 06/06/2020   Blurry vision 06/03/2020   Difficulty walking 06/03/2020   Dizziness 06/03/2020  Headache disorder 06/03/2020   Obstructive sleep apnea syndrome 03/28/2020   Complex renal cyst 03/16/2020   Tubulovillous adenoma of colon 12/14/2019   Blood on toilet paper 11/11/2019   Encounter for screening colonoscopy 11/11/2019   Hematochezia 11/11/2019   Family history of cancer 10/19/2019   Polycythemia 10/19/2019   Facial weakness 02/16/2019   Migraine 02/16/2019    [facility]P: [facility]  REFERRING PROVIDER: [name], MD  REFERRING DIAG: [phone] (ICD-10-CM) - Spinal stenosis,  cervical region  THERAPY DIAG:  Neck pain  Weakness of both upper extremities  Difficulty in walking, not elsewhere classified  Rationale for Evaluation and Treatment: Rehabilitation  ONSET DATE: C5-C7 anterior cervical decompression/discectomy fusion 02/06/23  SUBJECTIVE:                                                                                                                                                                                                         SUBJECTIVE STATEMENT: Pt states that she has been doing her exercises daily.    EVAL: Arrives to the clinic s/p C5-C7 anterior cervical decompression/discectomy fusion 02/06/23. Patient reports that the surgery did not help with her HA. Patient has some issues with her grip and lifting. Denies any pain on the neck and will only c/o pain on occasion (see below). Patient also reports of occasional numbness on the B fingers. Patient had to have cervical fusion due to a deteriorated disc that pressed on her spinal cord. Patient was in a collar for 5 weeks. Recent MD consultation referred her to outpatient PT evaluation and management. Hand dominance: Right  PERTINENT HISTORY:  Fibromyalgia, Migraine  PAIN:  Are you having pain? Yes: NPRS scale: 5/10 Pain location: neck shoulders, legs, and arms Pain description: uncomfortable (cannot be described) Aggravating factors: movement (unknown direction) Relieving factors: relaxing showers  PRECAUTIONS: Other: no lifting > 5 lbs  RED FLAGS: Bowel or bladder incontinence: No, Spinal tumors: No, Cauda equina syndrome: No, and Compression fracture: No     WEIGHT BEARING RESTRICTIONS: No  FALLS:  Has patient fallen in last 6 months? Yes. Number of falls 5x (2 weeks ago was the latest) because she loses balance and her legs feel weak.  LIVING ENVIRONMENT: Lives with: lives with their son and lives with grandson Lives in: House/apartment Stairs: No Has following equipment at  home: Ramped entry  OCCUPATION: Lawyer (part-time)  PLOF: Independent and Independent with basic ADLs  PATIENT GOALS: "hopefully no problems with mobility"  NEXT MD VISIT: December 2024  OBJECTIVE:   DIAGNOSTIC FINDINGS:  02/06/23  EXAM: DG CERVICAL SPINE - 1 VIEW   COMPARISON:  Cervical spine radiographs 12/31/2021 at [facility] neurosurgery.   FLUOROSCOPY: Exposure Index (as provided by the fluoroscopic device): 9.29   FINDINGS: 2 intraoperative images demonstrate disc spacers at C5-6 and C6-7. Anterior plate and screw fixation is present at both levels. Alignment is anatomic. Hardware is intact.   IMPRESSION: Anterior cervical discectomy and fusion at C5-6 and C6-7.  PATIENT SURVEYS:  NDI 15/50 = 30%  COGNITION: Overall cognitive status: Within functional limits for tasks assessed  SENSATION: Intact on B fingers and L LE, impaired on R LE  POSTURE: rounded shoulders and forward head  PALPATION: Grade 1 tenderness and moderate muscles spasm on B paracervicals, upper and middle rhomboids   CERVICAL ROM:   Active ROM A/PROM (deg) eval  Flexion 5  Extension 5  Right lateral flexion   Left lateral flexion   Right rotation 10  Left rotation 20   (Blank rows = not tested)  UPPER EXTREMITY ROM:  Active ROM Right eval Left eval  Shoulder flexion [facility]  Shoulder extension    Shoulder abduction [facility] WFL  Shoulder adduction [facility]  Shoulder extension    Shoulder internal rotation    Shoulder external rotation    Elbow flexion WFL WFL  Elbow extension [facility] WFL  Wrist flexion    Wrist extension    Wrist ulnar deviation    Wrist radial deviation    Wrist pronation    Wrist supination     (Blank rows = not tested)  UPPER EXTREMITY MMT:  MMT Right eval Left eval  Shoulder flexion 4 4  Shoulder extension    Shoulder abduction 4- 4-  Shoulder adduction    Shoulder extension    Shoulder internal rotation 4 4  Shoulder external rotation  4 4  Middle trapezius    Lower trapezius    Elbow flexion 4 4  Elbow extension 3+ 3+  Wrist flexion    Wrist extension    Wrist ulnar deviation    Wrist radial deviation    Wrist pronation    Wrist supination    Grip strength 3+ 4-   (Blank rows = not tested)  LOWER EXTREMITY MMT:  MMT Right eval Left eval  Hip flexion 3+ 3+  Hip abduction 4- 4-  Hip adduction 4- 4-  Knee flexion 4+ 4+  Knee extension 4 4  Ankle dorsiflexion 4 4  Ankle plantarflexion 4 4   (Blank rows = not tested)   FUNCTIONAL TESTS:  5 times sit to stand: 30.05 sec Timed up and go (TUG): 13.16 sec 2 minute walk test: 276 ft, needs to hold on to the walls on occasion  TODAY'S TREATMENT:                                                                                                                              DATE:06/12/23: Sitting: Cervical excursion x 3  thoracic excursions  x 3 Cervical isometric for side bend B and extension x 5 holding for 5 seconds Cervical retraction x 5 Scapular retraction x 5 Shoulder ER with 2# x 10 W back with 2# x 10 X to V x 10 Standing  Red theraband: Scapular retraction x 10 Rows x 10  Shoulder extension x 10  Supine  cervical rotation x 10 Scapular retraction x 10  UBE backward level 1 x 2 minutes    PATIENT EDUCATION:  Education details: Written HEP updated, provided, and reviewed Person educated: Patient Education method: Explanation Education comprehension: verbalized understanding  HOME EXERCISE PROGRAM: Access Code: [email or web address] URL: [email or web address] 06/12/23 - Seated Scapular Retraction with External Rotation  - 1 x daily - 7 x weekly - 1 sets - 10 reps - 3 seconds hold - Seated Shoulder External Rotation  - 1 x daily - 7 x weekly - 1 sets - 10 reps - 3 seconds  hold 04/29/2023 - Standing 3-Way Leg Reach with Resistance at Ankles and Unilateral Counter Support  - 1-2 x daily - 7 x weekly - 2 sets - 10 reps - Mini Squat with  Counter Support  - 1-2 x daily - 7 x weekly - 2 sets - 10 reps - 3 hold  04/24/23: Cervical and thoracic excursions.     Date: 04/17/2023 Prepared by: Krystal Clark  Exercises - Seated Shoulder Shrug  - 1-2 x daily - 7 x weekly - 2 sets - 10 reps - 3 hold - Seated Scapular Retraction  - 1-2 x daily - 7 x weekly - 2 sets - 10 reps - 3 hold - Shoulder Rolls in Sitting  - 1 x daily - 7 x weekly - 2 sets - 10 reps - Seated Trunk Rotation  - 1-2 x daily - 7 x weekly - 2 sets - 10 reps - Supine Cervical Retraction with Towel  - 1-2 x daily - 7 x weekly - 2 sets - 10 reps - 3 hold - Seated Isometric Cervical Sidebending  - 1-2 x daily - 7 x weekly - 2 sets - 10 reps - 3 hold - Seated Isometric Cervical Extension  - 1-2 x daily - 7 x weekly - 2 sets - 10 reps - 3 hold - Seated Isometric Cervical Flexion  - 1-2 x daily - 7 x weekly - 2 sets - 10 reps - 3 hold - Cat Cow  - 1-2 x daily - 7 x weekly - 2 sets - 10 reps - Supine March  - 1-2 x daily - 7 x weekly - 2 sets - 10 reps - Doorway Pec Stretch at 60 Degrees Abduction with Arm Straight  - 1-2 x daily - 7 x weekly - 3 reps - 30 hold .    ASSESSMENT:  CLINICAL IMPRESSION:  Pt pain significantly improved but pt continues to have significant lack of ROM.  Therapist updated HEP . To date, skilled PT is required to address the impairments and improve function.   EVAL: Patient is a 57 y.o. female who was seen today for physical therapy evaluation and treatment for S/P anterior cervical fusion. Patient had cervical fusion further defined by difficulty with driving and lifting due to pain, weakness, and decreased soft tissue extensibility and ROM. Skilled PT is required to address the impairments and functional limitations listed below.    OBJECTIVE IMPAIRMENTS: decreased activity tolerance, decreased balance, decreased mobility, difficulty walking, decreased ROM, decreased strength, impaired flexibility, and pain.   ACTIVITY LIMITATIONS: carrying,  lifting,  bending, standing, and squatting  PARTICIPATION LIMITATIONS: meal prep, cleaning, laundry, driving, and community activity  PERSONAL FACTORS: 1-2 comorbidities: Migraine, Fibromyalgia  are also affecting patient's functional outcome.   REHAB POTENTIAL: Fair    CLINICAL DECISION MAKING: Evolving/moderate complexity  EVALUATION COMPLEXITY: Moderate   GOALS: Goals reviewed with patient? Yes  SHORT TERM GOALS: Target date: 04/25/23  Pt will demonstrate indep in HEP to facilitate carry-over of skilled services and improve functional outcomes  LONG TERM GOALS: Target date: 05/16/23  Pt will demonstrate a decrease in NDI score by 19 % to demonstrate significant improvement in ADLs  Baseline: 30% Goal status: ongoing  2.  Patient will demonstrate increase in UE strength to 4 to facilitate ease in ADLs  Baseline: 3+ Goal status: ongoing  3.  Pt will demonstrate increase in LE strength to 4+ to facilitate ease and safety in ambulation  Baseline: 3+ Goal status:ongoing  4.  Pt will increase by at least 40 ft in order to demonstrate clinically significant improvement in community ambulation  Baseline: 276 ft Goal status: ongoing  5.  Pt will have a decrease in TUG score by at least 3 sec in order to demonstrate clinically significant improvement in community ambulation  Baseline: 13.16 sec Goal status: ongoing  6.  Pt will decrease 5TSTS by at least 3 seconds in order to demonstrate clinically significant improvement in LE strength   Baseline: 30.05 sec Goal status: ongoing   PLAN:  PT FREQUENCY: 2x/week  PT DURATION: 6 weeks  PLANNED INTERVENTIONS: Therapeutic exercises, Therapeutic activity, Neuromuscular re-education, Balance training, Gait training, Patient/Family education, Self Care, Dry Needling, Cryotherapy, Moist heat, and Manual therapy  PLAN FOR NEXT SESSION: Continue POC and may progress as tolerated with emphasis on gentle cervical stabilization  and balance exercises per protocol and as tolerated.  Virgina Organ, PT CLT [phone]

## 2023-06-26 ENCOUNTER — Ambulatory Visit (HOSPITAL_COMMUNITY): Payer: Medicaid Other | Attending: Neurosurgery | Admitting: Physical Therapy

## 2023-06-26 DIAGNOSIS — R29898 Other symptoms and signs involving the musculoskeletal system: Secondary | ICD-10-CM | POA: Diagnosis present

## 2023-06-26 DIAGNOSIS — M542 Cervicalgia: Secondary | ICD-10-CM | POA: Diagnosis present

## 2023-06-26 NOTE — Therapy (Addendum)
OUTPATIENT PHYSICAL THERAPY CERVICAL TREATMENT   Patient Name: Anita Michael MRN: [id number] DOB:[birth year], 57 y.o., female Today's Date: 07/01/2023   Expand All Collapse All   END OF SESSION:   PT End of Session - 06/26/23 1715       Visit Number 8    Number of Visits 12     Date for PT Re-Evaluation 05/16/23     Authorization Type [place] Medicaid Healthy (approved 12 visits)     Authorization Time Period 04/04/23-07/02/23     Authorization - Visit Number 7     Authorization - Number of Visits 10     Progress Note Due on Visit 10     PT Start Time 1530- pt late    PT Stop Time 1600    PT Time Calculation (min) 25 min     Activity Tolerance Patient tolerated treatment well     Behavior During Therapy [facility] for tasks assessed/performed                Past Medical History:  Diagnosis Date   CAD (coronary artery disease)    a. s/p NSTEMI in 06/2021 with DES to mid-RCA. Residual disease along D1 and 1st Mrg with medical management recommended.   Essential hypertension    Fibromyalgia    Generalized headaches    History of stroke    Noted incidentally by brain MRI July 2022   Mitral regurgitation    a. moderate to severe by echo in 06/2021   Polycythemia    PVC's (premature ventricular contractions)    Renal insufficiency    Sleep apnea    Stroke [facility])    Past Surgical History:  Procedure Laterality Date   ANKLE SURGERY Right    as a child   ANTERIOR CERVICAL DECOMP/DISCECTOMY FUSION N/A 02/06/2023   Procedure: Cervical Five-Cervical Six, Cervical Six-Cervical Seven. Anterior Cervical Decompression/Discectomy Fusion;  Surgeon: [name], MD;  Location: [facility] OR;  Service: Neurosurgery;  Laterality: N/A;   APPENDECTOMY     BREAST BIOPSY Right 2015   Fibroadenoma   CHOLECYSTECTOMY     CORONARY STENT INTERVENTION N/A 07/06/2021   Procedure: CORONARY STENT INTERVENTION;  Surgeon: Tonny Bollman, MD;  Location: [facility] INVASIVE CV LAB;  Service: Cardiovascular;   Laterality: N/A;   DRUG INDUCED ENDOSCOPY Bilateral 08/06/2022   Procedure: DRUG INDUCED ENDOSCOPY;  Surgeon: Christia Reading, MD;  Location: [facility];  Service: ENT;  Laterality: Bilateral;   LEFT HEART CATH AND CORONARY ANGIOGRAPHY N/A 07/06/2021   Procedure: LEFT HEART CATH AND CORONARY ANGIOGRAPHY;  Surgeon: Tonny Bollman, MD;  Location: [facility] INVASIVE CV LAB;  Service: Cardiovascular;  Laterality: N/A;   TONSILLECTOMY     Patient Active Problem List   Diagnosis Date Noted   Hemorrhoids 08/21/2021   Constipation 08/21/2021   Rectocele 08/21/2021   SUI (stress urinary incontinence, female) 08/21/2021   Vaginal pain 08/21/2021   PVC's (premature ventricular contractions) 07/07/2021   Tobacco use 07/06/2021   Mitral valve disorder 07/06/2021   NSTEMI (non-ST elevated myocardial infarction) (HCC) 07/06/2021   Other seasonal allergic rhinitis 05/14/2021   Claustrophobia 09/26/2020   Atrophic vaginitis 09/13/2020   Family history of muscular dystrophy 09/13/2020   Fibromyalgia 09/13/2020   Hypertensive disorder 09/13/2020   Irregular heart beat 09/13/2020   Mixed hyperlipidemia 09/13/2020   Nephrosclerosis 09/13/2020   Obesity with body mass index 30 or greater 09/13/2020   Sinusitis 09/13/2020   Vitamin D deficiency 09/13/2020   Difficulty with CPAP full face mask  use 06/06/2020   Hepatic steatosis 06/06/2020   Blurry vision 06/03/2020   Difficulty walking 06/03/2020   Dizziness 06/03/2020   Headache disorder 06/03/2020   Obstructive sleep apnea syndrome 03/28/2020   Complex renal cyst 03/16/2020   Tubulovillous adenoma of colon 12/14/2019   Blood on toilet paper 11/11/2019   Encounter for screening colonoscopy 11/11/2019   Hematochezia 11/11/2019   Family history of cancer 10/19/2019   Polycythemia 10/19/2019   Facial weakness 02/16/2019   Migraine 02/16/2019    [facility]P: [facility]  REFERRING PROVIDER: [name],  MD  REFERRING DIAG: [phone] (ICD-10-CM) - Spinal stenosis, cervical region  THERAPY DIAG:  Neck pain  Weakness of both upper extremities  Rationale for Evaluation and Treatment: Rehabilitation  ONSET DATE: C5-C7 anterior cervical decompression/discectomy fusion 02/06/23  SUBJECTIVE:                                                                                                                                                                                                         SUBJECTIVE STATEMENT: Pt states that she has been doing her exercises daily.  She is still having difficulty pushing her vacuum cleaner.    EVAL: Arrives to the clinic s/p C5-C7 anterior cervical decompression/discectomy fusion 02/06/23. Patient reports that the surgery did not help with her HA. Patient has some issues with her grip and lifting. Denies any pain on the neck and will only c/o pain on occasion (see below). Patient also reports of occasional numbness on the B fingers. Patient had to have cervical fusion due to a deteriorated disc that pressed on her spinal cord. Patient was in a collar for 5 weeks. Recent MD consultation referred her to outpatient PT evaluation and management. Hand dominance: Right  PERTINENT HISTORY:  Fibromyalgia, Migraine  PAIN:  Are you having pain? Yes: NPRS scale: 3.5/10 Pain location: neck shoulders, legs, and arms Pain description: uncomfortable (cannot be described) Aggravating factors: movement (unknown direction) Relieving factors: relaxing showers  PRECAUTIONS: Other: no lifting > 5 lbs  RED FLAGS: Bowel or bladder incontinence: No, Spinal tumors: No, Cauda equina syndrome: No, and Compression fracture: No     WEIGHT BEARING RESTRICTIONS: No  FALLS:  Has patient fallen in last 6 months? Yes. Number of falls 5x (2 weeks ago was the latest) because she loses balance and her legs feel weak.  LIVING ENVIRONMENT: Lives with: lives with their son and lives with  grandson Lives in: House/apartment Stairs: No Has following equipment at home: Ramped entry  OCCUPATION: Lawyer (part-time)  PLOF: Independent and  Independent with basic ADLs  PATIENT GOALS: "hopefully no problems with mobility"  NEXT MD VISIT: December 2024  OBJECTIVE:   DIAGNOSTIC FINDINGS:  02/06/23 EXAM: DG CERVICAL SPINE - 1 VIEW   COMPARISON:  Cervical spine radiographs 12/31/2021 at [facility] neurosurgery.   FLUOROSCOPY: Exposure Index (as provided by the fluoroscopic device): 9.29   FINDINGS: 2 intraoperative images demonstrate disc spacers at C5-6 and C6-7. Anterior plate and screw fixation is present at both levels. Alignment is anatomic. Hardware is intact.   IMPRESSION: Anterior cervical discectomy and fusion at C5-6 and C6-7.  PATIENT SURVEYS:  NDI 15/50 = 30%  COGNITION: Overall cognitive status: Within functional limits for tasks assessed  SENSATION: Intact on B fingers and L LE, impaired on R LE  POSTURE: rounded shoulders and forward head  PALPATION: Grade 1 tenderness and moderate muscles spasm on B paracervicals, upper and middle rhomboids   CERVICAL ROM:   Active ROM A/PROM (deg) eval  Flexion 5  Extension 5  Right lateral flexion   Left lateral flexion   Right rotation 10  Left rotation 20   (Blank rows = not tested)  UPPER EXTREMITY ROM:  Active ROM Right eval Left eval  Shoulder flexion [facility]  Shoulder extension    Shoulder abduction [facility] WFL  Shoulder adduction [facility]  Shoulder extension    Shoulder internal rotation    Shoulder external rotation    Elbow flexion WFL WFL  Elbow extension [facility] WFL  Wrist flexion    Wrist extension    Wrist ulnar deviation    Wrist radial deviation    Wrist pronation    Wrist supination     (Blank rows = not tested)  UPPER EXTREMITY MMT:  MMT Right eval Left eval  Shoulder flexion 4 4  Shoulder extension    Shoulder abduction 4- 4-  Shoulder adduction     Shoulder extension    Shoulder internal rotation 4 4  Shoulder external rotation 4 4  Middle trapezius    Lower trapezius    Elbow flexion 4 4  Elbow extension 3+ 3+  Wrist flexion    Wrist extension    Wrist ulnar deviation    Wrist radial deviation    Wrist pronation    Wrist supination    Grip strength 3+ 4-   (Blank rows = not tested)  LOWER EXTREMITY MMT:  MMT Right eval Left eval  Hip flexion 3+ 3+  Hip abduction 4- 4-  Hip adduction 4- 4-  Knee flexion 4+ 4+  Knee extension 4 4  Ankle dorsiflexion 4 4  Ankle plantarflexion 4 4   (Blank rows = not tested)   FUNCTIONAL TESTS:  5 times sit to stand: 30.05 sec Timed up and go (TUG): 13.16 sec 2 minute walk test: 276 ft, needs to hold on to the walls on occasion  TODAY'S TREATMENT:  DATE: 06/26/23 Sitting: Putty grip strength x 10 B ER with 2# x 15 reps W back 2# x 10 Shoulder flexion 2# dowel x 10  Cervical excursion x 3  Thoracic excursion x 3 X to V x 10  Retro circles x 10 Money x 10(end range horizontal abduction)  Shoulder abduction B x 10 Theraband: Red Scapular retraction, rows and shoulder extension all x 10  Isometric for extension, B sb x 10  Sit to stand x5    06/12/23: Sitting: Cervical excursion x 3  thoracic excursions x 3 Cervical isometric for side bend B and extension x 5 holding for 5 seconds Cervical retraction x 5 Scapular retraction x 5 Shoulder ER with 2# x 10 W back with 2# x 10 X to V x 10 Standing  Red theraband: Scapular retraction x 10 Rows x 10  Shoulder extension x 10  Supine  cervical rotation x 10 Scapular retraction x 10  UBE backward level 1 x 2 minutes    PATIENT EDUCATION:  Education details: Written HEP updated, provided, and reviewed Person educated: Patient Education method: Explanation Education comprehension: verbalized  understanding  HOME EXERCISE PROGRAM: 06/27/23 Therab- and postural 3  Sit to Stand with Arm Swing  - 1 x daily - 7 x weekly - 1 sets - 6-10 reps  Access Code: [email or web address] URL: [email or web address] 06/12/23 - Seated Scapular Retraction with External Rotation  - 1 x daily - 7 x weekly - 1 sets - 10 reps - 3 seconds hold - Seated Shoulder External Rotation  - 1 x daily - 7 x weekly - 1 sets - 10 reps - 3 seconds  hold 04/29/2023 - Standing 3-Way Leg Reach with Resistance at Ankles and Unilateral Counter Support  - 1-2 x daily - 7 x weekly - 2 sets - 10 reps - Mini Squat with Counter Support  - 1-2 x daily - 7 x weekly - 2 sets - 10 reps - 3 hold  04/24/23: Cervical and thoracic excursions.     Date: 04/17/2023 Prepared by: Krystal Clark  Exercises - Seated Shoulder Shrug  - 1-2 x daily - 7 x weekly - 2 sets - 10 reps - 3 hold - Seated Scapular Retraction  - 1-2 x daily - 7 x weekly - 2 sets - 10 reps - 3 hold - Shoulder Rolls in Sitting  - 1 x daily - 7 x weekly - 2 sets - 10 reps - Seated Trunk Rotation  - 1-2 x daily - 7 x weekly - 2 sets - 10 reps - Supine Cervical Retraction with Towel  - 1-2 x daily - 7 x weekly - 2 sets - 10 reps - 3 hold - Seated Isometric Cervical Sidebending  - 1-2 x daily - 7 x weekly - 2 sets - 10 reps - 3 hold - Seated Isometric Cervical Extension  - 1-2 x daily - 7 x weekly - 2 sets - 10 reps - 3 hold - Seated Isometric Cervical Flexion  - 1-2 x daily - 7 x weekly - 2 sets - 10 reps - 3 hold - Cat Cow  - 1-2 x daily - 7 x weekly - 2 sets - 10 reps - Supine March  - 1-2 x daily - 7 x weekly - 2 sets - 10 reps - Doorway Pec Stretch at 60 Degrees Abduction with Arm Straight  - 1-2 x daily - 7 x weekly - 3 reps - 30 hold .    ASSESSMENT:  CLINICAL  IMPRESSION:  PT  15 minutes late therefore session was abbreviated.  Added postural thera-band exercises to pt HEP. Reviewed isometric exercises.  Pt has good form with exercises. Noted improvement  with cervical retraction    EVAL: Patient is a 57 y.o. female who was seen today for physical therapy evaluation and treatment for S/P anterior cervical fusion. Patient had cervical fusion further defined by difficulty with driving and lifting due to pain, weakness, and decreased soft tissue extensibility and ROM. Skilled PT is required to address the impairments and functional limitations listed below.    OBJECTIVE IMPAIRMENTS: decreased activity tolerance, decreased balance, decreased mobility, difficulty walking, decreased ROM, decreased strength, impaired flexibility, and pain.   ACTIVITY LIMITATIONS: carrying, lifting, bending, standing, and squatting  PARTICIPATION LIMITATIONS: meal prep, cleaning, laundry, driving, and community activity  PERSONAL FACTORS: 1-2 comorbidities: Migraine, Fibromyalgia  are also affecting patient's functional outcome.   REHAB POTENTIAL: Fair    CLINICAL DECISION MAKING: Evolving/moderate complexity  EVALUATION COMPLEXITY: Moderate   GOALS: Goals reviewed with patient? Yes  SHORT TERM GOALS: Target date: 04/25/23  Pt will demonstrate indep in HEP to facilitate carry-over of skilled services and improve functional outcomes  LONG TERM GOALS: Target date: 05/16/23  Pt will demonstrate a decrease in NDI score by 19 % to demonstrate significant improvement in ADLs  Baseline: 30% Goal status: ongoing  2.  Patient will demonstrate increase in UE strength to 4 to facilitate ease in ADLs  Baseline: 3+ Goal status: ongoing  3.  Pt will demonstrate increase in LE strength to 4+ to facilitate ease and safety in ambulation  Baseline: 3+ Goal status:ongoing  4.  Pt will increase by at least 40 ft in order to demonstrate clinically significant improvement in community ambulation  Baseline: 276 ft Goal status: ongoing  5.  Pt will have a decrease in TUG score by at least 3 sec in order to demonstrate clinically significant improvement in  community ambulation  Baseline: 13.16 sec Goal status: ongoing  6.  Pt will decrease 5TSTS by at least 3 seconds in order to demonstrate clinically significant improvement in LE strength   Baseline: 30.05 sec Goal status: ongoing   PLAN:  PT FREQUENCY: 2x/week  PT DURATION: 6 weeks  PLANNED INTERVENTIONS: Therapeutic exercises, Therapeutic activity, Neuromuscular re-education, Balance training, Gait training, Patient/Family education, Self Care, Dry Needling, Cryotherapy, Moist heat, and Manual therapy  PLAN FOR NEXT SESSION: Continue POC and may progress as tolerated with emphasis on gentle cervical stabilization and balance exercises per protocol and as tolerated.  Virgina Organ, PT CLT [phone]

## 2023-07-01 ENCOUNTER — Ambulatory Visit: Payer: Medicaid Other | Admitting: Cardiology

## 2023-07-01 ENCOUNTER — Encounter (HOSPITAL_COMMUNITY): Payer: Medicaid Other

## 2023-07-04 ENCOUNTER — Encounter (HOSPITAL_COMMUNITY): Payer: Medicaid Other

## 2023-08-20 ENCOUNTER — Ambulatory Visit (INDEPENDENT_AMBULATORY_CARE_PROVIDER_SITE_OTHER): Payer: Medicaid Other | Admitting: Urology

## 2023-08-20 VITALS — BP 138/86 | HR 81

## 2023-08-20 DIAGNOSIS — N281 Cyst of kidney, acquired: Secondary | ICD-10-CM

## 2023-08-20 DIAGNOSIS — N3281 Overactive bladder: Secondary | ICD-10-CM | POA: Diagnosis not present

## 2023-08-20 MED ORDER — MIRABEGRON ER 25 MG PO TB24: 25.0000 mg | ORAL_TABLET | Freq: Every day | ORAL | 3 refills | Status: DC

## 2023-08-20 NOTE — Progress Notes (Signed)
08/20/2023 3:19 PM   Anita Michael 1965/08/03 [id number]  Referring provider: [facility] [address] [place],  [place] [address]  Renal cysts   HPI: Anita Michael is a 57yo here for followup for renal cysts.  Renal US 10/24 shows decrease in size of her renal cysts. She continues to have urinary urgency with small volume voids. Nocturia 2x. She was previously on [place] which failed to improve her urinary urgency    PMH: Past Medical History:  Diagnosis Date   CAD (coronary artery disease)    a. s/p NSTEMI in 06/2021 with DES to mid-RCA. Residual disease along D1 and 1st Mrg with medical management recommended.   Essential hypertension    Fibromyalgia    Generalized headaches    History of stroke    Noted incidentally by brain MRI July 2022   Mitral regurgitation    a. moderate to severe by echo in 06/2021   Polycythemia    PVC's (premature ventricular contractions)    Renal insufficiency    Sleep apnea    Stroke [facility])     Surgical History: Past Surgical History:  Procedure Laterality Date   ANKLE SURGERY Right    as a child   ANTERIOR CERVICAL DECOMP/DISCECTOMY FUSION N/A 02/06/2023   Procedure: Cervical Five-Cervical Six, Cervical Six-Cervical Seven. Anterior Cervical Decompression/Discectomy Fusion;  Surgeon: [name], MD;  Location: [facility] OR;  Service: Neurosurgery;  Laterality: N/A;   APPENDECTOMY     BREAST BIOPSY Right 2015   Fibroadenoma   CHOLECYSTECTOMY     CORONARY STENT INTERVENTION N/A 07/06/2021   Procedure: CORONARY STENT INTERVENTION;  Surgeon: Tonny Bollman, MD;  Location: [facility] INVASIVE CV LAB;  Service: Cardiovascular;  Laterality: N/A;   DRUG INDUCED ENDOSCOPY Bilateral 08/06/2022   Procedure: DRUG INDUCED ENDOSCOPY;  Surgeon: Christia Reading, MD;  Location: [facility];  Service: ENT;  Laterality: Bilateral;   LEFT HEART CATH AND CORONARY ANGIOGRAPHY N/A 07/06/2021   Procedure: LEFT HEART CATH AND  CORONARY ANGIOGRAPHY;  Surgeon: Tonny Bollman, MD;  Location: [facility] INVASIVE CV LAB;  Service: Cardiovascular;  Laterality: N/A;   TONSILLECTOMY      Home Medications:  Allergies as of 08/20/2023       Reactions   Nifedipine Other (See Comments), Palpitations   Heart races   Latex Rash        Medication List        Accurate as of August 20, 2023  3:19 PM. If you have any questions, ask your nurse or doctor.          STOP taking these medications    cyclobenzaprine 10 MG tablet Commonly known as: FLEXERIL   docusate sodium 100 MG capsule Commonly known as: Colace   metoprolol tartrate 25 MG tablet Commonly known as: LOPRESSOR   oxyCODONE-acetaminophen 5-325 MG tablet Commonly known as: Percocet       TAKE these medications    aspirin EC 81 MG tablet Take 1 tablet (81 mg total) by mouth daily. Swallow whole.   atorvastatin 80 MG tablet Commonly known as: LIPITOR TAKE 1 TABLET BY MOUTH DAILY AT 6PM.   cetirizine 10 MG tablet Commonly known as: ZYRTEC Take 10 mg by mouth daily as needed for allergies.   cholecalciferol 25 MCG (1000 UNIT) tablet Commonly known as: VITAMIN D3 Take 1,000 Units by mouth daily in the afternoon.   fluticasone 50 MCG/ACT nasal spray Commonly known as: FLONASE Place 2 sprays into both nostrils daily. What changed:  when to take this reasons to take this   Gemtesa 75 MG Tabs Generic drug: Vibegron Take 1 capsule by mouth daily.   isosorbide mononitrate 30 MG 24 hr tablet Commonly known as: IMDUR TAKE ONE TABLET BY MOUTH ONCE DAILY.   losartan 100 MG tablet Commonly known as: COZAAR TAKE ONE TABLET BY MOUTH ONCE DAILY.   nitroGLYCERIN 0.4 MG SL tablet Commonly known as: NITROSTAT Place 1 tablet (0.4 mg total) under the tongue every 5 (five) minutes x 3 doses as needed for chest pain.   nystatin powder Commonly known as: MYCOSTATIN/NYSTOP 1 application. 2 (two) times daily as needed (skin irritation).         Allergies:  Allergies  Allergen Reactions   Nifedipine Other (See Comments) and Palpitations    Heart races   Latex Rash    Family History: Family History  Problem Relation Age of Onset   Breast cancer Mother 53   Breast cancer Sister 24   Breast cancer Maternal Grandmother 40   Breast cancer Sister 43    Social History:  reports that she has been smoking cigarettes. She has never used smokeless tobacco. She reports current alcohol use. She reports that she does not use drugs.  ROS: All other review of systems were reviewed and are negative except what is noted above in HPI  Physical Exam: BP 138/86   Pulse 81   Constitutional:  Alert and oriented, No acute distress. HEENT: [place] AT, moist mucus membranes.  Trachea midline, no masses. Cardiovascular: No clubbing, cyanosis, or edema. Respiratory: Normal respiratory effort, no increased work of breathing. GI: Abdomen is soft, nontender, nondistended, no abdominal masses GU: No CVA tenderness.  Lymph: No cervical or inguinal lymphadenopathy. Skin: No rashes, bruises or suspicious lesions. Neurologic: Grossly intact, no focal deficits, moving all 4 extremities. Psychiatric: Normal mood and affect.  Laboratory Data: Lab Results  Component Value Date   WBC 9.0 02/04/2023   HGB 14.9 02/04/2023   HCT 49.1 (H) 02/04/2023   MCV 88.3 02/04/2023   PLT 249 02/04/2023    Lab Results  Component Value Date   CREATININE 0.82 02/04/2023    No results found for: "PSA"  No results found for: "TESTOSTERONE"  Lab Results  Component Value Date   HGBA1C 6.2 (H) 07/07/2021    Urinalysis    Component Value Date/Time   APPEARANCEUR Cloudy (A) 05/20/2022 1303   GLUCOSEU Negative 05/20/2022 1303   BILIRUBINUR Negative 05/20/2022 1303   PROTEINUR Negative 05/20/2022 1303   UROBILINOGEN 0.2 10/29/2021 1336   NITRITE Negative 05/20/2022 1303   LEUKOCYTESUR Negative 05/20/2022 1303    Lab Results  Component Value Date    LABMICR See below: 05/20/2022   WBCUA 0-5 05/20/2022   LABEPIT 0-10 05/20/2022   BACTERIA Many (A) 05/20/2022    Pertinent Imaging: Renal US 05/15/23: Images reviewed and discussed with the patient  No results found for this or any previous visit.  No results found for this or any previous visit.  No results found for this or any previous visit.  No results found for this or any previous visit.  Results for orders placed in visit on 04/08/23  US RENAL  Narrative CLINICAL DATA:  kidney cyst follow-up  EXAM: RENAL / URINARY TRACT ULTRASOUND COMPLETE  COMPARISON:  April 10, 2022, March 13, 2020.  FINDINGS: Right Kidney:  Renal measurements: 8.9 x 4.0 x 4.3 cm = volume: 82 mL. Echogenicity is mildly increased. No suspicious mass or hydronephrosis visualized. Asymmetric cortical thinning.  Benign cyst is noted measuring up to 4.2 cm (for which no dedicated imaging follow-up is recommended).  Left Kidney:  Renal measurements: 13.3 x 5.1 x 5.3 cm = volume: 212 mL. Echogenicity within normal limits. No mass or hydronephrosis visualized.  Bladder:  Appears normal for degree of bladder distention.  Other:  None.  IMPRESSION: 1. Asymmetric atrophy of the RIGHT kidney with mildly increased echogenicity. This is nonspecific but can be seen in the setting of medical renal disease. 2. Benign RIGHT renal cyst. No dedicated imaging follow-up is recommended.   Electronically Signed By: Meda Klinefelter M.D. On: 05/13/2023 16:55  No results found for this or any previous visit.  No results found for this or any previous visit.  No results found for this or any previous visit.   Assessment & Plan:    1. Kidney cysts (Primary) Followup 1 year with renal US - Urinalysis, Routine w reflex microscopic  2. OAB Mirabegron 25mg  daily   No follow-ups on file.  Wilkie Aye, MD  [facility] [place]

## 2023-08-21 LAB — URINALYSIS, ROUTINE W REFLEX MICROSCOPIC
Bilirubin, UA: NEGATIVE
Glucose, UA: NEGATIVE
Nitrite, UA: NEGATIVE
Protein,UA: NEGATIVE
RBC, UA: NEGATIVE
Specific Gravity, UA: 1.02 (ref 1.005–1.030)
Urobilinogen, Ur: 1 mg/dL (ref 0.2–1.0)
pH, UA: 7.5 (ref 5.0–7.5)

## 2023-08-21 LAB — MICROSCOPIC EXAMINATION: Bacteria, UA: NONE SEEN

## 2023-08-22 ENCOUNTER — Encounter: Payer: Self-pay | Admitting: Urology

## 2023-08-22 NOTE — Patient Instructions (Signed)

## 2023-08-29 ENCOUNTER — Telehealth: Payer: Self-pay | Admitting: Urology

## 2023-08-29 NOTE — Telephone Encounter (Signed)
 Patient states the most recent RX needs a prior [place]. She does not know the name of the medication but states it is the one he gave her last visit.

## 2023-08-29 NOTE — Telephone Encounter (Signed)
 PA started: [facility]

## 2023-09-11 NOTE — Progress Notes (Addendum)
 Cardiology Office Note    Date:  09/15/2023  ID:  SEBASTIAN DZIK, DOB 06/18/1966, MRN [id number] PCP:  Elmer Picker, [facility]  Cardiologist:  Nona Dell, MD  Electrophysiologist:  None   Chief Complaint: f/u CAD  History of Present Illness: Anita Michael    Anita Michael is a 58 y.o. female with visit-pertinent history of CAD with NSTEMI 06/2021 s/p DES to RCA, mitral regurgitation, HTN, fibromyalgia, incidentally noted possible old stroke by MRI 01/2021, RBBB, occasional PVCs seen for follow-up. At time of NSTEMI, cath otherwise showed residual nonobstructive diagonal and OM stenoses, managed medically. 2D echo at that time had shown EF 60-65% with moderate-severe MR. F/u echo 09/2021 showed EF 50-55% with mild MR. Nuclear stress test 06/2022 was abnormal with defect felt c/w artifact, otherwise EF 50%, low risk. Dr. Diona Browner felt this was reassuring. She was given the OK to stop Brilinta at that time. Note echo pre-NSTEMI in 2020 had shown EF 50-55% this appears to be her baseline now.  She returns today without any new cardiac complaints. She has not had any chest pain reminiscent of her prior angina. She will have rare chest pains with palpation. The current regimen seems to be effectively controlling these. Her pain complaint is all over body pain, head to toe, for the last year. She reports her neurosurgeon is referring her to rheumatology. She feels this is somewhat different than her previous fibromyalgia pain.  Labwork independently reviewed: 01/2023 Hgb 14.9, plt ok, K 3.9, Cr 0.82, AST ALT OK, alk phos 172 11/2021 LDL 65, trig 150  ROS: .    Please see the history of present illness.  All other systems are reviewed and otherwise negative.  Studies Reviewed: Anita Michael    EKG:  EKG Interpretation personally reviewed Date/Time:  Monday September 15 2023 13:15:07 EST Ventricular Rate:  78 PR Interval:  164 QRS Duration:  162 QT Interval:  422 QTC Calculation: 481 R  Axis:   135  Text Interpretation: Normal sinus rhythm Right bundle branch block Nonspecific TW changes noted, similar to previous tracings (extension to V5-V6 in older tracings) Nonacute Confirmed by Ronie Spies [phone]) on 09/15/2023 1:18:35 PM   CV Studies: Cardiac studies reviewed are outlined and summarized above. Otherwise please see EMR for full report.   Current Reported Medications:.    Current Meds  Medication Sig   aspirin EC 81 MG EC tablet Take 1 tablet (81 mg total) by mouth daily. Swallow whole.   atorvastatin (LIPITOR) 80 MG tablet TAKE 1 TABLET BY MOUTH DAILY AT 6PM.   cetirizine (ZYRTEC) 10 MG tablet Take 10 mg by mouth daily as needed for allergies.   cholecalciferol (VITAMIN D3) 25 MCG (1000 UT) tablet Take 1,000 Units by mouth daily in the afternoon.   DULoxetine (CYMBALTA) 30 MG capsule 1 capsule Orally Once a day for 30 days   ezetimibe (ZETIA) 10 MG tablet Take 1 tablet (10 mg total) by mouth daily.   fluticasone (FLONASE) 50 MCG/ACT nasal spray Place 2 sprays into both nostrils daily. (Patient taking differently: Place 2 sprays into both nostrils daily as needed for allergies.)   mirabegron ER (MYRBETRIQ) 25 MG TB24 tablet Take 1 tablet (25 mg total) by mouth daily.   nystatin (MYCOSTATIN/NYSTOP) powder 1 application. 2 (two) times daily as needed (skin irritation).   oxybutynin (DITROPAN) 5 MG tablet 1 tablet Orally Once a day   [ezetimibe (ZETIA) 10 MG tablet 1 tablet Orally Once a day for 30 days  Isosorbide mononitrate (IMDUR) 30 MG 24 hr tablet TAKE ONE TABLET BY MOUTH ONCE DAILY.    losartan (COZAAR) 100 MG tablet TAKE ONE TABLET BY MOUTH ONCE DAILY.   nitroGLYCERIN (NITROSTAT) 0.4 MG SL tablet Place 1 tablet (0.4 mg total) under the tongue every 5 (five) minutes x 3 doses as needed for chest pain.    Physical Exam:    VS:  BP 108/76 (BP Location: Left Arm, Patient Position: Sitting, Cuff Size: Normal)   Pulse 77   Ht 5' 7.5" (1.715 m)   Wt 189 lb (85.7  kg)   SpO2 98%   BMI 29.16 kg/m    Wt Readings from Last 3 Encounters:  09/15/23 189 lb (85.7 kg)  02/06/23 190 lb (86.2 kg)  02/04/23 190 lb 11.2 oz (86.5 kg)    GEN: Well nourished, well developed in no acute distress NECK: No JVD; No carotid bruits CARDIAC: RRR, no murmurs, rubs, gallops RESPIRATORY:  Clear to auscultation without rales, wheezing or rhonchi  ABDOMEN: Soft, non-tender, non-distended EXTREMITIES:  No edema; No acute deformity   Asessement and Plan:.    1. CAD - no accelerating anginal symptoms, has not had to take SL NTG recently. She participates in a milder form of exercise she calls lazy exercise because more intense activity makes her sore all over. Continue ASA 81mg  daily, Imdur 30mg  daily, SL NTG PRN. Refill Imdur, SL NTG today. Will update CBC, CMET, lipid profile (did not yet eat lunch).  2. Mitral regurgitation - mild by 2023 echo. Consider repeat study 2026-2028.  3. HTN - controlled. Continue Imdur 30mg  daily and losartan 100mg  daily. Update lytes today. Refill losartan per patient request.  4. PVCs - quiescent by exam and EKG. Get baseline TSH, Mg with labs given other generalized myalgia complaint. RBBB is stable.  5. Myalgias - no specific trigger, not acutely related to cholesterol med changes, distribution is widespread which seems less likely for statin myopathy. However, will check CK to be prudent and keep plan for f/u rheumatology as referred by neurosurgery. Check TSH, Mg with labs as well.     Disposition: F/u with Dr. Diona Browner in 6 months.  Signed, Laurann Montana, PA-C

## 2023-09-15 ENCOUNTER — Encounter: Payer: Self-pay | Admitting: Physician Assistant

## 2023-09-15 ENCOUNTER — Ambulatory Visit: Payer: Medicaid Other | Attending: Physician Assistant | Admitting: Physician Assistant

## 2023-09-15 ENCOUNTER — Other Ambulatory Visit (HOSPITAL_COMMUNITY)
Admission: RE | Admit: 2023-09-15 | Discharge: 2023-09-15 | Disposition: A | Discharge status: Home / Self Care | Payer: Medicaid Other | Source: Ambulatory Visit | Attending: Physician Assistant | Admitting: Physician Assistant

## 2023-09-15 VITALS — BP 108/76 | HR 77 | Ht 67.5 in | Wt 189.0 lb

## 2023-09-15 DIAGNOSIS — I493 Ventricular premature depolarization: Secondary | ICD-10-CM | POA: Insufficient documentation

## 2023-09-15 DIAGNOSIS — I10 Essential (primary) hypertension: Secondary | ICD-10-CM | POA: Diagnosis not present

## 2023-09-15 DIAGNOSIS — I340 Nonrheumatic mitral (valve) insufficiency: Secondary | ICD-10-CM | POA: Insufficient documentation

## 2023-09-15 DIAGNOSIS — M791 Myalgia, unspecified site: Secondary | ICD-10-CM | POA: Insufficient documentation

## 2023-09-15 DIAGNOSIS — I2510 Atherosclerotic heart disease of native coronary artery without angina pectoris: Secondary | ICD-10-CM | POA: Insufficient documentation

## 2023-09-15 LAB — COMPREHENSIVE METABOLIC PANEL
ALT: 11 U/L (ref 0–44)
AST: 12 U/L — ABNORMAL LOW (ref 15–41)
Albumin: 3.2 g/dL — ABNORMAL LOW (ref 3.5–5.0)
Alkaline Phosphatase: 82 U/L (ref 38–126)
Anion gap: 7 (ref 5–15)
BUN: 7 mg/dL (ref 6–20)
CO2: 28 mmol/L (ref 22–32)
Calcium: 9.4 mg/dL (ref 8.9–10.3)
Chloride: 104 mmol/L (ref 98–111)
Creatinine, Ser: 0.71 mg/dL (ref 0.44–1.00)
GFR, Estimated: >60 mL/min (ref >60)
Glucose, Bld: 83 mg/dL (ref 70–99)
Potassium: 3.7 mmol/L (ref 3.5–5.1)
Sodium: 139 mmol/L (ref 135–145)
Total Bilirubin: 0.9 mg/dL (ref 0.0–1.2)
Total Protein: 6.1 g/dL — ABNORMAL LOW (ref 6.5–8.1)

## 2023-09-15 LAB — LIPID PANEL
Cholesterol: 97 mg/dL (ref 0–200)
HDL: 32 mg/dL — ABNORMAL LOW (ref >40)
LDL Cholesterol: 49 mg/dL (ref 0–99)
Total CHOL/HDL Ratio: 3 {ratio}
Triglycerides: 78 mg/dL (ref <150)
VLDL: 16 mg/dL (ref 0–40)

## 2023-09-15 LAB — CBC
HCT: 49.4 % — ABNORMAL HIGH (ref 36.0–46.0)
Hemoglobin: 14.9 g/dL (ref 12.0–15.0)
MCH: 27.1 pg (ref 26.0–34.0)
MCHC: 30.2 g/dL (ref 30.0–36.0)
MCV: 89.8 fL (ref 80.0–100.0)
Platelets: 189 10*3/uL (ref 150–400)
RBC: 5.5 MIL/uL — ABNORMAL HIGH (ref 3.87–5.11)
RDW: 15.1 % (ref 11.5–15.5)
WBC: 8.3 10*3/uL (ref 4.0–10.5)
nRBC: 0 % (ref 0.0–0.2)

## 2023-09-15 LAB — MAGNESIUM: Magnesium: 1.9 mg/dL (ref 1.7–2.4)

## 2023-09-15 LAB — TSH: TSH: 0.815 u[IU]/mL (ref 0.350–4.500)

## 2023-09-15 LAB — CK: Total CK: 22 U/L — ABNORMAL LOW (ref 38–234)

## 2023-09-15 MED ORDER — ISOSORBIDE MONONITRATE ER 30 MG PO TB24: 30.0000 mg | ORAL_TABLET | Freq: Every day | ORAL | 2 refills | Status: AC

## 2023-09-15 MED ORDER — NITROGLYCERIN 0.4 MG SL SUBL: 0.4000 mg | SUBLINGUAL_TABLET | SUBLINGUAL | 2 refills | Status: AC | PRN

## 2023-09-15 MED ORDER — LOSARTAN POTASSIUM 100 MG PO TABS: 100.0000 mg | ORAL_TABLET | Freq: Every day | ORAL | 2 refills | Status: DC

## 2023-09-15 MED ORDER — EZETIMIBE 10 MG PO TABS: 10.0000 mg | ORAL_TABLET | Freq: Every day | ORAL | 3 refills | Status: DC

## 2023-09-15 NOTE — Patient Instructions (Addendum)
 Medication Instructions:  Your physician recommends that you continue on your current medications as directed. Please refer to the Current Medication list given to you today.   Labwork: Cmet,magnesium,lipids,ck,cbc,tsh today  Testing/Procedures: None today  Follow-Up: 6 months  Any Other Special Instructions Will Be Listed Below (If Applicable).  If you need a refill on your cardiac medications before your next appointment, please call your pharmacy.

## 2023-09-16 ENCOUNTER — Telehealth: Payer: Self-pay

## 2023-09-16 DIAGNOSIS — Z79899 Other long term (current) drug therapy: Secondary | ICD-10-CM

## 2023-09-16 MED ORDER — MAGNESIUM OXIDE -MG SUPPLEMENT 400 (240 MG) MG PO TABS: 400.0000 mg | ORAL_TABLET | Freq: Every day | ORAL | 3 refills | Status: DC

## 2023-09-16 MED ORDER — POTASSIUM CHLORIDE CRYS ER 20 MEQ PO TBCR
20.0000 meq | EXTENDED_RELEASE_TABLET | Freq: Every day | ORAL | 3 refills | Status: DC
Start: 2023-09-16 — End: 2023-10-03

## 2023-09-16 NOTE — Telephone Encounter (Signed)
 The patient has been notified of the result and verbalized understanding.  All questions (if any) were answered. Roseanne Reno, CMA 09/16/2023 11:09 AM

## 2023-09-16 NOTE — Telephone Encounter (Signed)
-----   Message from Laurann Montana sent at 09/16/2023  8:00 AM EST ----- Please let pt know labs overall stable, a few minor outliers including decreased protein level which can be for nutritional reasons. No major abnormalities. Muscle breakdown number is not elevated. She does have history of PVCs for which her goal K is 4.0 and Mg 2.0. Please increase dietary intake of healthy sources of potassium/magnesium if not allergic including bananas, squash, yogurt, white beans, sweet potatoes, leafy greens, avocados, nuts, seeds, fish, beans, whole grains, avocados. Can add KCl daily and MagOx 400mg  daily with repeat BMET/Mg in about 2 weeks. If she prefers to just increase dietary intake without formal prescription supplementation, this can be rechecked by PCP with routine labs in the future.

## 2023-09-25 ENCOUNTER — Other Ambulatory Visit (HOSPITAL_COMMUNITY): Payer: Self-pay | Admitting: Family Medicine

## 2023-09-25 DIAGNOSIS — R296 Repeated falls: Secondary | ICD-10-CM

## 2023-09-25 DIAGNOSIS — Z1231 Encounter for screening mammogram for malignant neoplasm of breast: Secondary | ICD-10-CM

## 2023-10-01 ENCOUNTER — Other Ambulatory Visit (HOSPITAL_COMMUNITY)
Admission: RE | Admit: 2023-10-01 | Discharge: 2023-10-01 | Disposition: A | Discharge status: Home / Self Care | Source: Ambulatory Visit | Attending: Physician Assistant | Admitting: Physician Assistant

## 2023-10-01 DIAGNOSIS — Z79899 Other long term (current) drug therapy: Secondary | ICD-10-CM | POA: Insufficient documentation

## 2023-10-01 LAB — BASIC METABOLIC PANEL
Anion gap: 8 (ref 5–15)
BUN: 10 mg/dL (ref 6–20)
CO2: 27 mmol/L (ref 22–32)
Calcium: 10.1 mg/dL (ref 8.9–10.3)
Chloride: 106 mmol/L (ref 98–111)
Creatinine, Ser: 0.75 mg/dL (ref 0.44–1.00)
GFR, Estimated: >60 mL/min (ref >60)
Glucose, Bld: 136 mg/dL — ABNORMAL HIGH (ref 70–99)
Potassium: 3.8 mmol/L (ref 3.5–5.1)
Sodium: 141 mmol/L (ref 135–145)

## 2023-10-01 LAB — MAGNESIUM: Magnesium: 2 mg/dL (ref 1.7–2.4)

## 2023-10-03 ENCOUNTER — Telehealth: Payer: Self-pay | Admitting: Cardiology

## 2023-10-03 ENCOUNTER — Telehealth: Payer: Self-pay | Admitting: *Deleted

## 2023-10-03 DIAGNOSIS — Z79899 Other long term (current) drug therapy: Secondary | ICD-10-CM

## 2023-10-03 MED ORDER — POTASSIUM CHLORIDE CRYS ER 20 MEQ PO TBCR
40.0000 meq | EXTENDED_RELEASE_TABLET | Freq: Every day | ORAL | 3 refills | Status: AC
Start: 2023-10-03 — End: ?

## 2023-10-03 NOTE — Telephone Encounter (Signed)
-----   Message from Laurann Montana sent at 10/01/2023  1:40 PM EDT ----- Please let the patient know that magnesium level looks great. Potassium level went up only marginally. Please make sure that she picked up the potassium prescription. If she confirmed she did, would increase the supplement to 40 mEq per day with repeat BMET 1-2 weeks. (Goal 4.0 given PVCs.) Make sure to come in for labs a few hours after her dose for that day, do not be fasting that day.

## 2023-10-03 NOTE — Telephone Encounter (Signed)
 Patient returned RN's call regarding results.

## 2023-10-03 NOTE — Telephone Encounter (Signed)
 The patient has been notified of the result and verbalized understanding.  All questions (if any) were answered. Elesa Massed, LPN 0/98/1191 47:82 AM

## 2023-10-03 NOTE — Telephone Encounter (Signed)
 Returned call to pt and notified of test results.

## 2023-10-06 ENCOUNTER — Ambulatory Visit (HOSPITAL_COMMUNITY)
Admission: RE | Admit: 2023-10-06 | Discharge: 2023-10-06 | Disposition: A | Discharge status: Home / Self Care | Source: Ambulatory Visit | Attending: Family Medicine | Admitting: Family Medicine

## 2023-10-06 DIAGNOSIS — R296 Repeated falls: Secondary | ICD-10-CM | POA: Diagnosis present

## 2023-10-06 MED ORDER — GADOBUTROL 1 MMOL/ML IV SOLN
7.0000 mL | Freq: Once | INTRAVENOUS | Status: AC | PRN
Start: 2023-10-06 — End: 2023-10-06
  Administered 2023-10-06: 7 mL via INTRAVENOUS

## 2023-10-08 ENCOUNTER — Ambulatory Visit (HOSPITAL_COMMUNITY)
Admission: RE | Admit: 2023-10-08 | Discharge: 2023-10-08 | Disposition: A | Discharge status: Home / Self Care | Source: Ambulatory Visit | Attending: Family Medicine | Admitting: Family Medicine

## 2023-10-08 ENCOUNTER — Encounter (HOSPITAL_COMMUNITY): Payer: Self-pay

## 2023-10-08 DIAGNOSIS — Z1231 Encounter for screening mammogram for malignant neoplasm of breast: Secondary | ICD-10-CM | POA: Insufficient documentation

## 2023-11-14 ENCOUNTER — Encounter: Payer: Self-pay | Admitting: Physical Medicine & Rehabilitation

## 2023-11-21 ENCOUNTER — Encounter: Payer: Medicaid Other | Admitting: Urology

## 2023-11-30 NOTE — Progress Notes (Signed)
 cancelled

## 2023-12-16 ENCOUNTER — Encounter: Payer: Self-pay | Admitting: Orthopedic Surgery

## 2023-12-16 NOTE — Progress Notes (Unsigned)
 Referring Physician:  Gwenyth Leo, FNP [address] [place],  [place] [address]  Primary Physician:  Bucio, Elsa C, FNP  History of Present Illness: 12/18/2023 Anita Michael has a history of HTN, migraines, NSTEMI, OSA, FM, mixed hyperlipidemia, polycythemia, obesity.   She is s/p ACDF C5-C7 on 02/06/23 for cervical myelopathy and myelomalacia. Arm pain improved after surgery.   3-4 month history of diffuse spine pain with radiation to arms and legs. Her leg pain is her primary complaint. Not having much pain in her thoracic spine.   She has intermittent neck pain with constant bilateral arm pain into her hands. No numbness or tingling, she has weakness. She is dropping. Feels different than her preop symptoms.   No thoracic pain.   She has constant LBP with bilateral leg pain (entire leg) to her feet. LBP < leg pain, right leg pain = left leg pain. No numbness or tingling, but she has weakness in her legs. She has been falling frequently- she is using a walker and this is a little better.   No alleviating or aggravating factors.   She smokes 4 cigarettes a day x 40+ years, she's stopped intermittently.   Bowel/Bladder Dysfunction: she has urinary urgency. No incontinence of bowels/bladder  Conservative measures:  Physical therapy: has participated in PT in December Multimodal medical therapy including regular antiinflammatories: Cymbalta, flexeril  Injections: no epidural steroid injections  Past Surgery:  02/06/2023 C5-C7 ACDF Surgeon: Arvilla Birmingham, MD in [facility] has some symptoms of cervical myelopathy. She notes some dexterity issues and has balance issues.   The symptoms are causing a significant impact on the patient's life.   Review of Systems:  A 10 point review of systems is negative, except for the pertinent positives and negatives detailed in the HPI.  Past Medical History: Past Medical History:  Diagnosis Date   CAD (coronary artery  disease)    a. s/p NSTEMI in 06/2021 with DES to mid-RCA. Residual disease along D1 and 1st Mrg with medical management recommended.   Essential hypertension    Fibromyalgia    Generalized headaches    History of stroke    Noted incidentally by brain MRI July 2022   Mitral regurgitation    a. moderate to severe by echo in 06/2021   Polycythemia    PVC's (premature ventricular contractions)    Renal insufficiency    Sleep apnea    Stroke [facility])     Past Surgical History: Past Surgical History:  Procedure Laterality Date   ANKLE SURGERY Right    as a child   ANTERIOR CERVICAL DECOMP/DISCECTOMY FUSION N/A 02/06/2023   Procedure: Cervical Five-Cervical Six, Cervical Six-Cervical Seven. Anterior Cervical Decompression/Discectomy Fusion;  Surgeon: Van Gelinas, MD;  Location: [facility];  Service: Neurosurgery;  Laterality: N/A;   APPENDECTOMY     BREAST BIOPSY Right 2015   Fibroadenoma   CHOLECYSTECTOMY     CORONARY STENT INTERVENTION N/A 07/06/2021   Procedure: CORONARY STENT INTERVENTION;  Surgeon: Arnoldo Lapping, MD;  Location: [facility] INVASIVE CV LAB;  Service: Cardiovascular;  Laterality: N/A;   DRUG INDUCED ENDOSCOPY Bilateral 08/06/2022   Procedure: DRUG INDUCED ENDOSCOPY;  Surgeon: Virgina Grills, MD;  Location: [facility];  Service: ENT;  Laterality: Bilateral;   LEFT HEART CATH AND CORONARY ANGIOGRAPHY N/A 07/06/2021   Procedure: LEFT HEART CATH AND CORONARY ANGIOGRAPHY;  Surgeon: Arnoldo Lapping, MD;  Location: [facility] INVASIVE CV LAB;  Service: Cardiovascular;  Laterality: N/A;  TONSILLECTOMY      Allergies: Allergies as of 12/18/2023 - Review Complete 12/18/2023  Allergen Reaction Noted   Nifedipine Other (See Comments) and Palpitations 05/30/2020   Latex Rash 02/10/2020   Neurontin  [gabapentin ] Swelling and Rash 12/18/2023    Medications: Outpatient Encounter Medications as of 12/18/2023  Medication Sig   aspirin  EC 81 MG EC tablet Take 1 tablet (81 mg  total) by mouth daily. Swallow whole.   atorvastatin  (LIPITOR ) 80 MG tablet TAKE 1 TABLET BY MOUTH DAILY AT 6PM.   cetirizine (ZYRTEC) 10 MG tablet Take 10 mg by mouth daily as needed for allergies.   cholecalciferol (VITAMIN D3) 25 MCG (1000 UT) tablet Take 1,000 Units by mouth daily in the afternoon.   DULoxetine (CYMBALTA) 30 MG capsule 1 capsule Orally Once a day for 30 days   ezetimibe  (ZETIA ) 10 MG tablet Take 10 mg by mouth daily.   fluticasone  (FLONASE ) 50 MCG/ACT nasal spray Place 2 sprays into both nostrils daily. (Patient taking differently: Place 2 sprays into both nostrils daily as needed for allergies.)   isosorbide  mononitrate (IMDUR ) 30 MG 24 hr tablet Take 1 tablet (30 mg total) by mouth daily.   losartan  (COZAAR ) 100 MG tablet Take 1 tablet (100 mg total) by mouth daily.   magnesium  oxide (MAGOX 400) 400 (240 Mg) MG tablet Take 1 tablet (400 mg total) by mouth daily.   mirabegron  ER (MYRBETRIQ ) 25 MG TB24 tablet Take 1 tablet (25 mg total) by mouth daily.   nystatin (MYCOSTATIN/NYSTOP) powder 1 application. 2 (two) times daily as needed (skin irritation).   oxybutynin (DITROPAN) 5 MG tablet 1 tablet Orally Once a day   potassium chloride  SA (KLOR-CON  M) 20 MEQ tablet Take 2 tablets (40 mEq total) by mouth daily.   nitroGLYCERIN  (NITROSTAT ) 0.4 MG SL tablet Place 1 tablet (0.4 mg total) under the tongue every 5 (five) minutes x 3 doses as needed for chest pain. (Patient not taking: Reported on 12/18/2023)   Vibegron  (GEMTESA ) 75 MG TABS Take 1 capsule by mouth daily. (Patient not taking: Reported on 01/29/2023)   [DISCONTINUED] ezetimibe  (ZETIA ) 10 MG tablet Take 1 tablet (10 mg total) by mouth daily.   No facility-administered encounter medications on file as of 12/18/2023.    Social History: Social History   Tobacco Use   Smoking status: Some Days    Current packs/day: 0.00    Types: Cigarettes    Last attempt to quit: 02/07/2019    Years since quitting: 4.8   Smokeless  tobacco: Never   Tobacco comments:    1 cigarette a day   Vaping Use   Vaping status: Never Used  Substance Use Topics   Alcohol use: Yes    Comment: 1x a year   Drug use: No    Family Medical History: Family History  Problem Relation Age of Onset   Breast cancer Mother 28   Breast cancer Sister 35   Breast cancer Maternal Grandmother 49   Breast cancer Sister 101    Physical Examination: Vitals:   12/18/23 1020 12/18/23 1056  BP: (!) 140/70 130/74    General: Patient is well developed, well nourished, calm, collected, and in no apparent distress. Attention to examination is appropriate.  Respiratory: Patient is breathing without any difficulty.   NEUROLOGICAL:     Awake, alert, oriented to person, place, and time.  Speech is clear and fluent. Fund of knowledge is appropriate.   Cranial Nerves: Pupils equal round and reactive to light.  Facial tone is symmetric.    Well healed cervical incision.   She has diffuse posterior cervical tenderness. She has diffuse tenderness in bilateral trapezial region.   She has diffuse posterior lumbar tenderness.   No abnormal lesions on exposed skin.   Strength: Side Biceps Triceps Deltoid Interossei Grip Wrist Ext. Wrist Flex.  R 5 5 5 5 5 5 5   L 5 5 5 5 5 5 5    Side Iliopsoas Quads Hamstring PF DF EHL  R 5 4 4 5 5 5   L 5 4 4 5 5 5    She has pain with strength testing.   Reflexes are 2+ and symmetric at the biceps, brachioradialis, patella and achilles.   Hoffman's is absent.  Clonus is not present.   Bilateral upper and lower extremity sensation is intact to light touch.     She ambulates with walker and has unsteady gait.   Medical Decision Making  Imaging: Nothing recent.   Assessment and Plan: Ms. Alkins 3-4 month history of diffuse spine pain with radiation to arms and legs. Her leg pain is her primary complaint. She's had significant change her ability to [facility]- was ambulating with no issues 6 months  ago.   She has intermittent neck pain with constant bilateral arm pain into her hands. No numbness or tingling, she has weakness. She is dropping things.   She is s/p ACDF C5-C7 on 02/06/23 for cervical myelopathy and myelomalacia. Arm pain improved after surgery. These symptoms feel different then she did prior to surgery.   She has constant LBP with bilateral leg pain (entire leg) to her feet. LBP < leg pain, right leg pain = left leg pain. No numbness or tingling, but she has weakness in her legs. She has been falling frequently.   No recent lumbar imaging.   Treatment options discussed with patient and following plan made:   - MRI of cervical spine to evaluate cervical radiculopathy and to evaluate for cervical stenosis.  - MRI of thoracic spine to evaluate balance issues and frequent falling.  - MRI of lumbar spine to evaluate lumbar radiculopathy.  - Will get cervical xrays with flex/ext when she gets the MRI scans done.  - No NSAIDs per cardiology (per patient).  - Has taken flexeril  in the past and it is not helping. Will stop this and new prescription for robaxin. Reviewed dosing and side effects. She is aware this can make her sleepy.  - Will schedule follow up visit to review MRI and xray results once I get them back.   I spent a total of 45 minutes in face-to-face and non-face-to-face activities related to this patient's care today including review of outside records, review of imaging, review of symptoms, physical exam, discussion of differential diagnosis, discussion of treatment options, and documentation.   Thank you for involving me in the care of this patient.   Lucetta Russel PA-C Dept. of Neurosurgery

## 2023-12-18 ENCOUNTER — Ambulatory Visit: Admitting: Orthopedic Surgery

## 2023-12-18 ENCOUNTER — Encounter: Payer: Self-pay | Admitting: Orthopedic Surgery

## 2023-12-18 VITALS — BP 130/74 | Ht 68.0 in | Wt 171.2 lb

## 2023-12-18 DIAGNOSIS — M5412 Radiculopathy, cervical region: Secondary | ICD-10-CM | POA: Diagnosis not present

## 2023-12-18 DIAGNOSIS — M5416 Radiculopathy, lumbar region: Secondary | ICD-10-CM

## 2023-12-18 DIAGNOSIS — R2681 Unsteadiness on feet: Secondary | ICD-10-CM | POA: Diagnosis not present

## 2023-12-18 DIAGNOSIS — G959 Disease of spinal cord, unspecified: Secondary | ICD-10-CM

## 2023-12-18 MED ORDER — METHOCARBAMOL 500 MG PO TABS
500.0000 mg | ORAL_TABLET | Freq: Three times a day (TID) | ORAL | 0 refills | Status: DC | PRN
Start: 2023-12-18 — End: 2024-03-08

## 2023-12-18 NOTE — Patient Instructions (Signed)
 It was so nice to see you today. Thank you so much for coming in.    I want to get an MRI of your neck, mid back, and lower back to look into things further. We will get this approved through your insurance and Cristine Done will call you to schedule the appointment. Ask about your patient responsibility. You do not need to pay this prior to getting MRI, they can bill you.   When you get the MRI scans done, remind them to do xrays of your neck as well.   After you have the MRIs and xrays, it takes 14-21 days for me to get the results back. Once I have them, we will call you to schedule a follow up visit with me to review them.   Stop the cyclobenzaprine . I sent a prescription for robaxin to help with muscle spasms. Use only as needed and be careful, this can make you sleepy. You may want to just take at night.   Please do not hesitate to call if you have any questions or concerns. You can also message me in MyChart.   Lucetta Russel PA-C [phone]     The physicians and staff at [facility] are committed to providing excellent care. You may receive a survey asking for feedback about your experience at our office. We value you your feedback and appreciate you taking the time to to fill it out. The [facility] leadership team is also available to discuss your experience in person, feel free to contact us  [phone].

## 2024-01-09 ENCOUNTER — Ambulatory Visit (HOSPITAL_COMMUNITY)
Admission: RE | Admit: 2024-01-09 | Discharge: 2024-01-09 | Disposition: A | Discharge status: Home / Self Care | Source: Ambulatory Visit | Attending: Orthopedic Surgery | Admitting: Orthopedic Surgery

## 2024-01-09 DIAGNOSIS — R2681 Unsteadiness on feet: Secondary | ICD-10-CM

## 2024-01-09 DIAGNOSIS — M5416 Radiculopathy, lumbar region: Secondary | ICD-10-CM | POA: Diagnosis present

## 2024-01-09 DIAGNOSIS — G959 Disease of spinal cord, unspecified: Secondary | ICD-10-CM | POA: Diagnosis present

## 2024-01-09 DIAGNOSIS — M5412 Radiculopathy, cervical region: Secondary | ICD-10-CM

## 2024-01-12 ENCOUNTER — Encounter: Payer: Self-pay | Admitting: Physical Medicine & Rehabilitation

## 2024-01-12 ENCOUNTER — Encounter: Attending: Physical Medicine & Rehabilitation | Admitting: Physical Medicine & Rehabilitation

## 2024-01-12 VITALS — BP 153/93 | HR 90 | Ht 68.0 in | Wt 164.4 lb

## 2024-01-12 DIAGNOSIS — Z5181 Encounter for therapeutic drug level monitoring: Secondary | ICD-10-CM | POA: Insufficient documentation

## 2024-01-12 DIAGNOSIS — Z79899 Other long term (current) drug therapy: Secondary | ICD-10-CM | POA: Insufficient documentation

## 2024-01-12 DIAGNOSIS — M797 Fibromyalgia: Secondary | ICD-10-CM | POA: Diagnosis not present

## 2024-01-12 DIAGNOSIS — G894 Chronic pain syndrome: Secondary | ICD-10-CM | POA: Insufficient documentation

## 2024-01-12 NOTE — Progress Notes (Addendum)
 HPI  Anita Michael is a 58 y.o. year old female  who  has a past medical history of CAD (coronary artery disease), Essential hypertension, Fibromyalgia, Generalized headaches, History of stroke, Mitral regurgitation, Polycythemia, PVC's (premature ventricular contractions), Renal insufficiency, Sleep apnea, and Stroke (HCC).   They are presenting to PM&R clinic as a new patient for pain management evaluation. They were referred by Silvio Ramp, NP for treatment of chronic bodywide pain pain.  Thank you for referral of this patient.  Patient is here with her daughter.  She reports that she has long hx of fibromyalgia, being diagnosed with this condition when she was 26.   Patient says for a long time that her issues with pain were primarily in her shoulders, arms and neck.  Around March 2025 her pain became worse and spread to her lower back and legs.  She reports feeling weak throughout her body and had several falls, now walking with a rolling walker.  Patient reports she is walking with much better stability since she has started using the rolling walker.  Her pain is particularly severe at night.  She was previously on Cymbalta for pain, this medication was restarted last month.  She is not sure how much Cymbalta is helping.  She feels like she is tolerating Cymbalta overall but it does occasionally cause mild nausea.    She had MRI of L spine, C spine and T spine completed a few days ago- results pending.  This study was ordered by neurosurgery.   Patient reports she was previously doing some light exercises but she stopped this in April due to the pain.  Patient had a ACDF C5-7 on 02/06/2023  by Dorn Ned for cervical myelopathy.  She reports that her upper extremity pain and strength did improve after the surgery.  Denies bowel or bladder incontinence.  Patient has used tramadol   which did take the edge off her pain.  Patient worked with physical therapy last year, reports this greatly  worsened her pain after doing it.  She did try aquatic therapy in the past and found this to be helpful.   Reports she saw rheumatology in the past who felt fibromyalgia was likely diagnosis.  Red flag symptoms: No red flags for back pain endorsed in Hx or ROS  Medications tried: Topical medications Aspercream helps a little  Nsaids -  not recently  Tylenol   - Tylenol - helps slightly  Opiates - Tramadol - helped a little  Gabapentin  - swelling Lyrica - didn't help , put me in [place] TCAs - Does not recall  SNRIs - Taking cymbalta- some nausea with it  Robaxin  helped a little  Other treatments: PT- Last year after surgery, caused a lot of pain. Pool therapy helped in the past.  TENs unit- denies Injections- denies Surgery - C spine surgery in 2024 as above    Prior UDS results: No results found for: LABOPIA, COCAINSCRNUR, LABBENZ, AMPHETMU, THCU, LABBARB   Pain Inventory Average Pain 10 Pain Right Now 10 My pain is constant  In the last 24 hours, has pain interfered with the following? General activity 0 Relation with others 0 Enjoyment of life 0 What TIME of day is your pain at its worst? morning , daytime, evening, and night Sleep (in general) Poor  Pain is worse with: walking, bending, sitting, inactivity, standing, and some activites Pain improves with: nothing Relief from Meds: 0  walk with assistance use a cane use a walker how many minutes can  you walk? 3-4 ability to climb steps?  no do you drive?  yes  employed # of hrs/week . 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  no    Family History  Problem Relation Age of Onset   Breast cancer Mother 67   Breast cancer Sister 75   Breast cancer Maternal Grandmother 22   Breast cancer Sister 78   Social History    Socioeconomic History   Marital status: Widowed    Spouse name: Not on file   Number of children: 6   Years of education: Not on file   Highest education level: Not on file  Occupational History   Not on file  Tobacco Use   Smoking status: Some Days    Current packs/day: 0.00    Types: Cigarettes    Last attempt to quit: 02/07/2019    Years since quitting: 4.9   Smokeless tobacco: Never   Tobacco comments:    1 cigarette a day   Vaping Use   Vaping status: Never Used  Substance and Sexual Activity   Alcohol use: Yes    Comment: 1x a year   Drug use: No   Sexual activity: Not Currently    Birth control/protection: Post-menopausal  Other Topics Concern   Not on file  Social History Narrative   ** Merged History Encounter **       Social Drivers of Health   Financial Resource Strain: Medium Risk (07/27/2021)   Overall Financial Resource Strain (CARDIA)    Difficulty of Paying Living Expenses: Somewhat hard  Food Insecurity: No Food Insecurity (07/27/2021)   Hunger Vital Sign    Worried About Running Out of Food in the Last Year: Never true    Ran Out of Food in the Last Year: Never true  Transportation Needs: No Transportation Needs (07/27/2021)   PRAPARE - Administrator, Civil Service (Medical): No    Lack of Transportation (Non-Medical): No  Physical Activity: Inactive (07/27/2021)   Exercise Vital Sign    Days of Exercise per Week: 0 days    Minutes of Exercise per Session: 0 min  Stress: No Stress Concern Present (07/27/2021)   [employer] of Occupational Health - Occupational Stress Questionnaire    Feeling of Stress : Not at all  Social Connections: Moderately Isolated (07/27/2021)   Social Connection and Isolation Panel    Frequency of Communication with Friends and Family: Twice a week    Frequency of Social Gatherings with Friends and Family: Once a week    Attends Religious Services: 1 to 4 times per year    Active Member of [employer] or  Organizations: No    Attends Banker Meetings: Never    Marital Status: Widowed   Past Surgical History:  Procedure Laterality Date   ANKLE SURGERY Right    as a child   ANTERIOR CERVICAL DECOMP/DISCECTOMY FUSION N/A 02/06/2023   Procedure: Cervical Five-Cervical Six, Cervical Six-Cervical Seven. Anterior Cervical Decompression/Discectomy Fusion;  Surgeon: Debby Dorn MATSU, MD;  Location[facility];  Service: Neurosurgery;  Laterality: N/A;   APPENDECTOMY     BREAST BIOPSY Right 2015   Fibroadenoma   CHOLECYSTECTOMY     CORONARY STENT INTERVENTION N/A 07/06/2021   Procedure: CORONARY STENT INTERVENTION;  Surgeon: Wonda Sharper, MD;  Location: [facility] INVASIVE CV LAB;  Service: Cardiovascular;  Laterality: N/A;   DRUG INDUCED ENDOSCOPY Bilateral 08/06/2022   Procedure: DRUG INDUCED ENDOSCOPY;  Surgeon: Carlie Clark, MD;  Location: [facility];  Service: ENT;  Laterality: Bilateral;   LEFT HEART CATH AND CORONARY ANGIOGRAPHY N/A 07/06/2021   Procedure: LEFT HEART CATH AND CORONARY ANGIOGRAPHY;  Surgeon: Wonda Sharper, MD;  Location: [facility] INVASIVE CV LAB;  Service: Cardiovascular;  Laterality: N/A;   TONSILLECTOMY     Past Medical History:  Diagnosis Date   CAD (coronary artery disease)    a. s/p NSTEMI in 06/2021 with DES to mid-RCA. Residual disease along D1 and 1st Mrg with medical management recommended.   Essential hypertension    Fibromyalgia    Generalized headaches    History of stroke    Noted incidentally by brain MRI July 2022   Mitral regurgitation    a. moderate to severe by echo in 06/2021   Polycythemia    PVC's (premature ventricular contractions)    Renal insufficiency    Sleep apnea    Stroke (HCC)    BP (!) 153/93   Pulse 90   Ht 5' 8 (1.727 m)   Wt 164 lb 6.4 oz (74.6 kg)   SpO2 94%   BMI 25.00 kg/m   Opioid Risk Score:   Fall Risk Score:  `1  Depression screen PHQ 2/9     01/12/2024    1:19 PM 09/27/2021    1:30 PM 07/27/2021    10:29 AM  Depression screen PHQ 2/9  Decreased Interest 0 0 0  Down, Depressed, Hopeless 0 0 0  PHQ - 2 Score 0 0 0  Altered sleeping 3 0 0  Tired, decreased energy 1 3 3   Change in appetite 1 0 1  Feeling bad or failure about yourself  0 0 0  Trouble concentrating 0 0 0  Moving slowly or fidgety/restless 0 0 0  Suicidal thoughts 0 0 0  PHQ-9 Score 5 3 4   Difficult doing work/chores Very difficult Very difficult      Subjective:    Patient ID: Anita Michael, female    DOB: [birth year], 58 y.o.   MRN: [id number]  HPI    Review of Systems  Constitutional:  Positive for appetite change.  Respiratory:  Positive for apnea.   Gastrointestinal:  Positive for constipation and nausea.  Endocrine:       Sometimes low blood sugar  Musculoskeletal:  Positive for gait problem.       Spasms  Neurological:  Positive for dizziness and weakness.  Psychiatric/Behavioral:  Positive for dysphoric mood.   All other systems reviewed and are negative.      Objective:   Physical Exam Gen: no distress, normal appearing HEENT: oral mucosa pink and moist, NCAT Chest: normal effort, normal rate of breathing Abd: soft, non-distended Ext: no edema Psych: pleasant, flat affect Skin: Purple discoloration in her bilateral feet-patient reports this is chronic and she has had multiple negative vascular studies Neuro: Alert and awake, follows commands, cranial nerves II through XII grossly intact, normal speech and language RUE: 4/5 Deltoid, 4/5 Biceps, 4/5 Triceps, 4/5 Wrist Ext, 4/5 Grip LUE: 4/5 Deltoid, 4/5 Biceps, 4/5 Triceps, 4/5 Wrist Ext, 4/5 Grip RLE: HF 4/5, KE 4/5, ADF 3/5, APF 4/5 LLE: HF 4/5, KE 4/5, ADF 3/5, APF 4/5 Sensory exam normal for light touch and pain in all 4 limbs. No limb ataxia or cerebellar signs. No abnormal tone appreciated.  No abnormal tone noted Musculoskeletal:   TTP T-spine, L-spine  and C-spine paraspinal muscles TTP throughout bilateral parascapular  muscles TTP throughout bilateral upper and lower extremities Walking with rolling walker Limited C-spine ROM in all directions Maintains head and forward position   MRI C spine 11/13/21  IMPRESSION: 1. Degenerative disc osteophyte at C5-6 with resultant severe spinal stenosis, with severe right and moderate left C6 foraminal narrowing. Patchy signal abnormality within the cervical spinal cord at this level consistent with compressive myelomalacia. 2. Right paracentral disc osteophyte at C6-7 with secondary flattening of the right hemi cord, but no visible cord signal changes at this level. Associated severe left C7 foraminal stenosis. 3. Moderate left C4 foraminal stenosis related to uncovertebral and facet disease. 4. Multilevel facet arthrosis throughout the cervical spine as above, most pronounced at C4-5 on the right. Findings could contribute to underlying neck pain.    Assessment & Plan:   1) Fibromyalgia - Patient has widespread pain  2) Denies Depression  3) Sleep disorder, limited due to the pain   4) Chronic Lower Back pain    5) Chronic Neck Pain  -s/p ACDF C5-7 on 02/06/2023  by Dorn Ned   -Opioid risk tool low -Food for pain discussed,list provided for foods that can be helpful for pain -Discussed low impact progressive exercise -Discussed trying yoga or tai chi -Referral for aquatic therapy placed today -Tens Unit recommended, Nexwave ordered -Advised not to take more tylenol  then 3000mg /day- discussed this can cause liver failure if taken at a higher doses -Continue Robaxin  PRN -Continue cymbalta 30mg  dasily-will not increase dose as she reports it causes mild nausea -F/u results MRI C spine, L spine and T spine-completed but results pending -Pt reports she has used Hemp Cream- advised to discontinue -Pt reports her son gave her a pill for pain 2 weeks ago, she is not sure what this medication was. Advised against not using any medications not  prescribed to her.  - UDS and pain agreement today, consider continuation of tramadol .  Patient says she is going to [place]  for about a week, advised her to call after she gets back clinic and we can discuss results.   02/03/24 Reviewed drug screen, order tramadol  50mg  BID PRN

## 2024-01-13 NOTE — Addendum Note (Signed)
 Addended by: URBANO ALBRIGHT on: 01/13/2024 12:04 AM   Modules accepted: Level of Service

## 2024-01-15 ENCOUNTER — Ambulatory Visit: Payer: Self-pay | Admitting: Neurosurgery

## 2024-01-15 LAB — DRUG TOX ALC METAB W/CON, ORAL FLD: Alcohol Metabolite: NEGATIVE ng/mL (ref <25)

## 2024-01-15 LAB — DRUG TOX MONITOR 1 W/CONF, ORAL FLD
Alprazolam: NEGATIVE ng/mL (ref <0.50)
Amphetamines: NEGATIVE ng/mL (ref <10)
Barbiturates: NEGATIVE ng/mL (ref <10)
Benzodiazepines: NEGATIVE ng/mL (ref <0.50)
Buprenorphine: NEGATIVE ng/mL (ref <0.10)
Chlordiazepoxide: NEGATIVE ng/mL (ref <0.50)
Clonazepam: NEGATIVE ng/mL (ref <0.50)
Cocaine: NEGATIVE ng/mL (ref <5.0)
Cotinine: >250 ng/mL — ABNORMAL HIGH (ref <5.0)
Diazepam: NEGATIVE ng/mL (ref <0.50)
Fentanyl: NEGATIVE ng/mL (ref <0.10)
Flunitrazepam: NEGATIVE ng/mL (ref <0.50)
Flurazepam: NEGATIVE ng/mL (ref <0.50)
Heroin Metabolite: NEGATIVE ng/mL (ref <1.0)
Lorazepam: NEGATIVE ng/mL (ref <0.50)
MARIJUANA: NEGATIVE ng/mL (ref <2.5)
MDMA: NEGATIVE ng/mL (ref <10)
Meprobamate: NEGATIVE ng/mL (ref <2.5)
Methadone: NEGATIVE ng/mL (ref <5.0)
Midazolam: NEGATIVE ng/mL (ref <0.50)
Nicotine Metabolite: POSITIVE ng/mL — AB (ref <5.0)
Nordiazepam: NEGATIVE ng/mL (ref <0.50)
Opiates: NEGATIVE ng/mL (ref <2.5)
Oxazepam: NEGATIVE ng/mL (ref <0.50)
Phencyclidine: NEGATIVE ng/mL (ref <10)
Tapentadol: NEGATIVE ng/mL (ref <5.0)
Temazepam: NEGATIVE ng/mL (ref <0.50)
Tramadol: 48 ng/mL — ABNORMAL HIGH (ref <5.0)
Tramadol: POSITIVE ng/mL — AB (ref <5.0)
Triazolam: NEGATIVE ng/mL (ref <0.50)
Zolpidem: NEGATIVE ng/mL (ref <5.0)

## 2024-01-20 ENCOUNTER — Ambulatory Visit (HOSPITAL_COMMUNITY)
Admission: RE | Admit: 2024-01-20 | Discharge: 2024-01-20 | Disposition: A | Discharge status: Home / Self Care | Source: Ambulatory Visit | Attending: Orthopedic Surgery | Admitting: Orthopedic Surgery

## 2024-01-20 ENCOUNTER — Other Ambulatory Visit (HOSPITAL_COMMUNITY): Payer: Self-pay | Admitting: Orthopedic Surgery

## 2024-01-20 DIAGNOSIS — Z981 Arthrodesis status: Secondary | ICD-10-CM | POA: Diagnosis present

## 2024-01-30 ENCOUNTER — Telehealth: Payer: Self-pay | Admitting: Physical Medicine & Rehabilitation

## 2024-01-30 NOTE — Progress Notes (Unsigned)
 Referring Physician:  Alston Silvio BROCKS, FNP [address] [place],  [place] [address]  Primary Physician:  Bucio, Elsa C, FNP  History of Present Illness: Ms. Anita Michael has a history of HTN, migraines, NSTEMI, OSA, FM, mixed hyperlipidemia, polycythemia, obesity.   Last seen by me on 12/18/23 for diffuse spine pain with radiation to arms and legs. Her leg pain is her primary complaint. She's had significant change her ability to [facility]- was ambulating with no issues 6 months ago.   She is s/p ACDF C5-C7 on 02/06/23 for cervical myelopathy and myelomalacia.   MRI of cervical, thoracic, and lumbar MRI were ordered along with cervical xrays. She is here to review them.   She continues with diffuse spine pain with radiation to arms and legs. Her leg pain is her primary complaint. She has good days and bad days with her walking.   She has intermittent neck pain with more constant bilateral arm pain into her hands. No numbness or tingling, she has weakness. She is dropping things.   She has constant LBP with bilateral leg pain (entire leg) to her feet. LBP < leg pain, right leg pain = left leg pain. No numbness or tingling, but she has weakness in her legs. She has been falling frequently.   No alleviating or aggravating factors.   She smokes 4 cigarettes a day x 40+ years, she's stopped intermittently.   Bowel/Bladder Dysfunction: she has urinary urgency. No incontinence of bowels/bladder  Conservative measures:  Physical therapy: has participated in PT in December Multimodal medical therapy including regular antiinflammatories: Cymbalta, flexeril  Injections: no epidural steroid injections  Past Surgery:  02/06/2023 C5-C7 ACDF Surgeon: Dorn Ned, MD in [facility] has some symptoms of cervical myelopathy. She notes some dexterity issues and has balance issues.   The symptoms are causing a significant impact on the patient's life.   Review of Systems:  A 10  point review of systems is negative, except for the pertinent positives and negatives detailed in the HPI.  Past Medical History: Past Medical History:  Diagnosis Date   CAD (coronary artery disease)    a. s/p NSTEMI in 06/2021 with DES to mid-RCA. Residual disease along D1 and 1st Mrg with medical management recommended.   Essential hypertension    Fibromyalgia    Generalized headaches    History of stroke    Noted incidentally by brain MRI July 2022   Mitral regurgitation    a. moderate to severe by echo in 06/2021   Polycythemia    PVC's (premature ventricular contractions)    Renal insufficiency    Sleep apnea    Stroke [facility])     Past Surgical History: Past Surgical History:  Procedure Laterality Date   ANKLE SURGERY Right    as a child   ANTERIOR CERVICAL DECOMP/DISCECTOMY FUSION N/A 02/06/2023   Procedure: Cervical Five-Cervical Six, Cervical Six-Cervical Seven. Anterior Cervical Decompression/Discectomy Fusion;  Surgeon: Ned Dorn MATSU, MD;  Location: [facility] OR;  Service: Neurosurgery;  Laterality: N/A;   APPENDECTOMY     BREAST BIOPSY Right 2015   Fibroadenoma   CHOLECYSTECTOMY     CORONARY STENT INTERVENTION N/A 07/06/2021   Procedure: CORONARY STENT INTERVENTION;  Surgeon: Wonda Sharper, MD;  Location: [facility] INVASIVE CV LAB;  Service: Cardiovascular;  Laterality: N/A;   DRUG INDUCED ENDOSCOPY Bilateral 08/06/2022   Procedure: DRUG INDUCED ENDOSCOPY;  Surgeon: Carlie Clark, MD;  Location: [facility];  Service: ENT;  Laterality:  Bilateral;   LEFT HEART CATH AND CORONARY ANGIOGRAPHY N/A 07/06/2021   Procedure: LEFT HEART CATH AND CORONARY ANGIOGRAPHY;  Surgeon: Wonda Sharper, MD;  Location: [facility] INVASIVE CV LAB;  Service: Cardiovascular;  Laterality: N/A;   TONSILLECTOMY      Allergies: Allergies as of 02/04/2024 - Review Complete 02/04/2024  Allergen Reaction Noted   Nifedipine Other (See Comments) and Palpitations 05/30/2020   Latex Rash 02/10/2020    Neurontin  [gabapentin ] Swelling and Rash 12/18/2023    Medications: Outpatient Encounter Medications as of 02/04/2024  Medication Sig   aspirin  EC 81 MG EC tablet Take 1 tablet (81 mg total) by mouth daily. Swallow whole.   atorvastatin  (LIPITOR ) 80 MG tablet TAKE 1 TABLET BY MOUTH DAILY AT 6PM.   cetirizine (ZYRTEC) 10 MG tablet Take 10 mg by mouth daily as needed for allergies.   cholecalciferol (VITAMIN D3) 25 MCG (1000 UT) tablet Take 1,000 Units by mouth daily in the afternoon.   DULoxetine (CYMBALTA) 30 MG capsule 1 capsule Orally Once a day for 30 days   ezetimibe  (ZETIA ) 10 MG tablet Take 10 mg by mouth daily.   fluticasone  (FLONASE ) 50 MCG/ACT nasal spray Place 2 sprays into both nostrils daily. (Patient taking differently: Place 2 sprays into both nostrils daily as needed for allergies.)   isosorbide  mononitrate (IMDUR ) 30 MG 24 hr tablet Take 1 tablet (30 mg total) by mouth daily.   losartan  (COZAAR ) 100 MG tablet Take 1 tablet (100 mg total) by mouth daily.   magnesium  oxide (MAGOX 400) 400 (240 Mg) MG tablet Take 1 tablet (400 mg total) by mouth daily.   methocarbamol  (ROBAXIN ) 500 MG tablet Take 1 tablet (500 mg total) by mouth every 8 (eight) hours as needed for muscle spasms. This can make you sleepy.   nitroGLYCERIN  (NITROSTAT ) 0.4 MG SL tablet Place 1 tablet (0.4 mg total) under the tongue every 5 (five) minutes x 3 doses as needed for chest pain.   nystatin (MYCOSTATIN/NYSTOP) powder 1 application. 2 (two) times daily as needed (skin irritation).   potassium chloride  SA (KLOR-CON  M) 20 MEQ tablet Take 2 tablets (40 mEq total) by mouth daily.   Vibegron  (GEMTESA ) 75 MG TABS Take 1 capsule by mouth daily.   [DISCONTINUED] traMADol  (ULTRAM ) 50 MG tablet Take 1 tablet (50 mg total) by mouth every 12 (twelve) hours as needed.   No facility-administered encounter medications on file as of 02/04/2024.    Social History: Social History   Tobacco Use   Smoking status: Every  Day    Current packs/day: 0.00    Types: Cigarettes    Last attempt to quit: 02/07/2019    Years since quitting: 4.9   Smokeless tobacco: Never   Tobacco comments:    1 cigarette a day   Vaping Use   Vaping status: Never Used  Substance Use Topics   Alcohol use: Yes    Comment: 1x a year   Drug use: No    Family Medical History: Family History  Problem Relation Age of Onset   Breast cancer Mother 76   Breast cancer Sister 21   Breast cancer Maternal Grandmother 71   Breast cancer Sister 23    Physical Examination: Vitals:   02/04/24 1134  BP: 104/70      Awake, alert, oriented to person, place, and time.  Speech is clear and fluent. Fund of knowledge is appropriate.   Cranial Nerves: Pupils equal round and reactive to light.  Facial tone is symmetric.  Well healed cervical incision.   She has diffuse posterior cervical tenderness. She has diffuse tenderness in bilateral trapezial region.   She has diffuse posterior lumbar tenderness.   No abnormal lesions on exposed skin.   Strength: Side Biceps Triceps Deltoid Interossei Grip Wrist Ext. Wrist Flex.  R 5 5 5 5 5 5 5   L 5 5 5 5 5 5 5    Side Iliopsoas Quads Hamstring PF DF EHL  R 5 4 5 5 5 5   L 5 4 5 5 5 5    She has pain with strength testing.   Reflexes are 2+ and symmetric at the biceps, brachioradialis, patella and achilles.   Hoffman's is absent.  Clonus is not present.   Bilateral upper and lower extremity sensation is intact to light touch.     She ambulates with walker and has unsteady gait.   Medical Decision Making  Imaging: Cervical xrays dated 01/09/24:  FINDINGS: Seven cervical segments are well visualized. Prior cervical fusion at C5-6 and C6-7 are again seen with anterior fixation. No hardware failure is noted. Mild neural foraminal narrowing is noted at C4-5 and C5-6 on the right. The odontoid is within normal limits. Facet hypertrophic changes are noted at multiple levels. No soft  tissue abnormality is seen.   IMPRESSION: Degenerative and postsurgical changes in the cervical spine. Neural foraminal narrowing is seen as described.     Electronically Signed   By: Oneil Devonshire M.D.   On: 01/19/2024 21:07   Flexion/extension cervical xrays dated 01/20/24:  FINDINGS: C5-C7 ACDF. The hardware is intact. There is no acute fracture subluxation of the cervical spine. No listhesis on flexion or extension. The bones are osteopenic. The visualized posterior elements are intact. The soft tissues are unremarkable.   IMPRESSION: C5-C7 ACDF. No acute findings.     Electronically Signed   By: Vanetta Chou M.D.   On: 01/20/2024 15:06   Cervical MRI dated 01/09/24:  FINDINGS: There is normal alignment of the cervical spine. Anterior cervical fusion and discectomy is identified from C5 through C7. No evidence of hardware failure. There is moderate spinal stenosis throughout the fusion level with slight flattening of the cervical cord. Mild myelomalacia suspected in the cord at the C6 level. Correlation for chronic myelopathy. There is no vertebral body height loss, subluxation or marrow replacing process. Posterior fossa demonstrates no abnormality.   C2-3: There is no focal disc protrusion, foraminal or spinal stenosis. Mild bilateral facet arthrosis.   C3-4: There is no focal disc protrusion, foraminal or spinal stenosis. Mild-to-moderate bilateral facet arthrosis.   C4-5: There is no focal disc protrusion, foraminal or spinal stenosis. Moderate bilateral facet arthrosis.   C5-6: Fusion hardware limits evaluation. There is suggestion moderate bilateral foraminal narrowing, right greater than left. Correlation for C6 radiculopathy. There is effacement of the ventral thecal sac with flattening of the. The AP diameter of the canal measures approximately 8 mm.   C6-7: Moderate facet bilateral foraminal narrowing is present secondary to uncovertebral  osteophyte, left greater than right. Correlation for C7 radiculopathy. There is effacement of ventral thecal sac with moderate spinal stenosis. The AP diameter of the canal measures 7 and 8 mm.   C7-T1: No significant foraminal or spinal stenosis.   IMPRESSION: Anterior cervical fusion and discectomy from C5 through C7. There is significant foraminal and spinal stenosis throughout the surgical level. See above for more detail at each individual level.   Mild atrophy is seen in the cord at the C6  level with mild increased signal likely reflective of chronic myelomalacia. Clinical correlation.   Electronically signed by: Norleen Satchel MD 01/16/2024 04:24 PM EDT RP Workstation: [id number]   Thoracic MRI dated 01/09/24:  FINDINGS: There is normal alignment of the thoracic spine. Mild disc desiccation is present without focal extrusion, foraminal or spinal stenosis. Minimal broad-based bulges are present to T6-7 and T9-10. No significant foraminal or spinal stenosis is present. Mild facet arthrosis is present at T7-8, left greater than right. There is no vertebral body height loss, subluxation or marrow replacing process. The thoracic cord is unremarkable.   The paraspinal structures demonstrate no significant abnormality. Benign cyst is identified in the right kidney. Mild nonspecific right renal atrophy is present. Otherwise, no significant abnormality is present.   IMPRESSION: Mild disc disease without significant protrusion, foraminal or spinal stenosis. No acute abnormality.   Right renal cyst with mild nonspecific right renal atrophy.   Electronically signed by: Norleen Satchel MD 01/16/2024 04:27 PM EDT RP Workstation: [id number]   Lumbar MRI dated 01/09/24:  FINDINGS: The alignment is unremarkable. Mild degenerative disc disease throughout relatively sparing L5-S1. Very mild degenerative disc disease L3-4. The marrow signal is otherwise unremarkable as well as the conus  and cauda equina.   L4-5 has mild bilateral foraminal narrowing due to broad-based disc bulge and facet hypertrophy.   The image levels are otherwise unremarkable.   Mild to moderate atrophy right kidney and benign-appearing renal cysts. The imaged retroperitoneal structures are otherwise unremarkable.   The imaged retroperitoneal structures are otherwise unremarkable.     IMPRESSION: No disc protrusion or evidence of impingement.   L4-5 has mild bilateral degenerative foraminal narrowing.   Mild degenerative disc disease throughout.   Electronically signed by: Reyes Frees MD 01/14/2024 05:03 AM EDT RP Workstation: [id number]    I have personally reviewed the images and agree with the above interpretation.   Assessment and Plan: Ms. Grimme continues with diffuse spine pain with radiation to arms and legs. Her leg pain is her primary complaint. She has good days and bad days with her walking.   She has intermittent neck pain with more constant bilateral arm pain into her hands. No numbness or tingling, she has weakness. She is dropping things.   Known ACDF C5-C7 with mild myelomalacia at C6 that appears chronic. She has mild/moderate spinal stenosis C5-C7, but nothing severe. Foraminal stenosis C5-C7 as well.   No cord compression seen on thoracic MRI.   She has constant LBP with bilateral leg pain (entire leg) to her feet. LBP < leg pain, right leg pain = left leg pain. No numbness or tingling, but she has weakness in her legs. She has been falling frequently.   She has lumbar spondylosis with only mild foraminal stenosis L4-L5.   She has 4/5 strength in bilateral quads on exam.   Treatment options discussed with patient and following plan made:   - Imaging reviewed with Dr. Claudene after her visit. I don't see anything spine mediated that would be causing her arm/leg weakness or balance issues.  - Agree with consultation with neurology scheduled next month. May  consider EMG/NCS of upper/lower extremities but will defer to them.  - Will send a message to [facility] to see if her appointment can me moved up.  - Message sent to patient as well.   I spent a total of 40 minutes in face-to-face and non-face-to-face activities related to this patient's care today including review of outside records, review of imaging,  review of symptoms, physical exam, discussion of differential diagnosis, discussion of treatment options, and documentation.   Glade Boys PA-C Dept. of Neurosurgery

## 2024-01-30 NOTE — Telephone Encounter (Signed)
 Patient calling regarding medication refill. She says that Dr. Urbano told her to call us  when she got back from vacation so that he could prescribe her something for her pain. She says that she is not sure what the medication is called.

## 2024-02-03 MED ORDER — TRAMADOL HCL 50 MG PO TABS: 50.0000 mg | ORAL_TABLET | Freq: Two times a day (BID) | ORAL | 0 refills | Status: DC | PRN

## 2024-02-03 NOTE — Addendum Note (Signed)
 Addended by: Jeremiah Tarpley on: 02/03/2024 02:54 PM   Modules accepted: Orders

## 2024-02-04 ENCOUNTER — Ambulatory Visit: Admitting: Orthopedic Surgery

## 2024-02-04 ENCOUNTER — Encounter: Payer: Self-pay | Admitting: Orthopedic Surgery

## 2024-02-04 VITALS — BP 104/70 | Ht 67.5 in | Wt 162.0 lb

## 2024-02-04 DIAGNOSIS — M5416 Radiculopathy, lumbar region: Secondary | ICD-10-CM

## 2024-02-04 DIAGNOSIS — M4802 Spinal stenosis, cervical region: Secondary | ICD-10-CM

## 2024-02-04 DIAGNOSIS — G9589 Other specified diseases of spinal cord: Secondary | ICD-10-CM

## 2024-02-04 DIAGNOSIS — M47816 Spondylosis without myelopathy or radiculopathy, lumbar region: Secondary | ICD-10-CM | POA: Diagnosis not present

## 2024-02-04 DIAGNOSIS — M48061 Spinal stenosis, lumbar region without neurogenic claudication: Secondary | ICD-10-CM | POA: Diagnosis not present

## 2024-02-04 DIAGNOSIS — R296 Repeated falls: Secondary | ICD-10-CM

## 2024-02-04 DIAGNOSIS — R2681 Unsteadiness on feet: Secondary | ICD-10-CM

## 2024-02-04 DIAGNOSIS — M47812 Spondylosis without myelopathy or radiculopathy, cervical region: Secondary | ICD-10-CM

## 2024-02-04 DIAGNOSIS — M5412 Radiculopathy, cervical region: Secondary | ICD-10-CM

## 2024-02-04 MED ORDER — TRAMADOL HCL 50 MG PO TABS: 50.0000 mg | ORAL_TABLET | Freq: Two times a day (BID) | ORAL | 0 refills | Status: DC | PRN

## 2024-02-04 NOTE — Telephone Encounter (Signed)
 Patient retuning phone call from Dr. Urbano.

## 2024-02-04 NOTE — Addendum Note (Signed)
 Addended by: Ceci Taliaferro on: 02/04/2024 04:33 PM   Modules accepted: Orders

## 2024-02-25 ENCOUNTER — Other Ambulatory Visit: Payer: Self-pay

## 2024-02-25 ENCOUNTER — Ambulatory Visit (HOSPITAL_BASED_OUTPATIENT_CLINIC_OR_DEPARTMENT_OTHER): Attending: Physical Medicine & Rehabilitation | Admitting: Physical Therapy

## 2024-02-25 DIAGNOSIS — I789 Disease of capillaries, unspecified: Secondary | ICD-10-CM | POA: Insufficient documentation

## 2024-02-25 DIAGNOSIS — R2689 Other abnormalities of gait and mobility: Secondary | ICD-10-CM | POA: Insufficient documentation

## 2024-02-25 DIAGNOSIS — M797 Fibromyalgia: Secondary | ICD-10-CM | POA: Insufficient documentation

## 2024-02-25 DIAGNOSIS — E663 Overweight: Secondary | ICD-10-CM | POA: Insufficient documentation

## 2024-02-25 DIAGNOSIS — R296 Repeated falls: Secondary | ICD-10-CM | POA: Insufficient documentation

## 2024-02-25 DIAGNOSIS — R2681 Unsteadiness on feet: Secondary | ICD-10-CM | POA: Insufficient documentation

## 2024-02-25 DIAGNOSIS — Z820 Family history of epilepsy and other diseases of the nervous system: Secondary | ICD-10-CM | POA: Insufficient documentation

## 2024-02-25 DIAGNOSIS — M6281 Muscle weakness (generalized): Secondary | ICD-10-CM | POA: Diagnosis present

## 2024-02-25 DIAGNOSIS — J302 Other seasonal allergic rhinitis: Secondary | ICD-10-CM | POA: Insufficient documentation

## 2024-02-25 NOTE — Therapy (Signed)
 OUTPATIENT PHYSICAL THERAPY THORACOLUMBAR EVALUATION   Patient Name: Anita Michael MRN: [id number] DOB:[birth year], 58 y.o., female Today's Date: 02/26/2024  END OF SESSION:  PT End of Session - 02/26/24 1801     Visit Number 1    Number of Visits 6    Date for PT Re-Evaluation 04/23/24    Authorization Type [place] medicaid    PT Start Time 1316    PT Stop Time 1355    PT Time Calculation (min) 39 min    Activity Tolerance Patient limited by fatigue;No increased pain    Behavior During Therapy [facility] for tasks assessed/performed          Past Medical History:  Diagnosis Date   CAD (coronary artery disease)    a. s/p NSTEMI in 06/2021 with DES to mid-RCA. Residual disease along D1 and 1st Mrg with medical management recommended.   Essential hypertension    Fibromyalgia    Generalized headaches    History of stroke    Noted incidentally by brain MRI July 2022   Mitral regurgitation    a. moderate to severe by echo in 06/2021   Polycythemia    PVC's (premature ventricular contractions)    Renal insufficiency    Sleep apnea    Stroke [facility])    Past Surgical History:  Procedure Laterality Date   ANKLE SURGERY Right    as a child   ANTERIOR CERVICAL DECOMP/DISCECTOMY FUSION N/A 02/06/2023   Procedure: Cervical Five-Cervical Six, Cervical Six-Cervical Seven. Anterior Cervical Decompression/Discectomy Fusion;  Surgeon: Debby Dorn MATSU, MD;  Location: [facility] OR;  Service: Neurosurgery;  Laterality: N/A;   APPENDECTOMY     BREAST BIOPSY Right 2015   Fibroadenoma   CHOLECYSTECTOMY     CORONARY STENT INTERVENTION N/A 07/06/2021   Procedure: CORONARY STENT INTERVENTION;  Surgeon: Wonda Sharper, MD;  Location: [facility] INVASIVE CV LAB;  Service: Cardiovascular;  Laterality: N/A;   DRUG INDUCED ENDOSCOPY Bilateral 08/06/2022   Procedure: DRUG INDUCED ENDOSCOPY;  Surgeon: Carlie Clark, MD;  Location: [facility];  Service: ENT;  Laterality: Bilateral;   LEFT HEART CATH AND  CORONARY ANGIOGRAPHY N/A 07/06/2021   Procedure: LEFT HEART CATH AND CORONARY ANGIOGRAPHY;  Surgeon: Wonda Sharper, MD;  Location: [facility] INVASIVE CV LAB;  Service: Cardiovascular;  Laterality: N/A;   TONSILLECTOMY     Patient Active Problem List   Diagnosis Date Noted   Capillary disease 02/25/2024   Family history of movement disorder 02/25/2024   Overweight 02/25/2024   Recurrent falls 02/25/2024   Seasonal allergies 02/25/2024   Antiplatelet or antithrombotic long-term use 07/31/2022   BMI 28.0-28.9,adult 07/31/2022   Hemorrhoids 08/21/2021   Constipation 08/21/2021   Herniation of rectum into vagina 08/21/2021   SUI (stress urinary incontinence, female) 08/21/2021   Vaginal pain 08/21/2021   PVC's (premature ventricular contractions) 07/07/2021   Tobacco use 07/06/2021   Mitral valve disorder 07/06/2021   NSTEMI (non-ST elevated myocardial infarction) (HCC) 07/06/2021   Other seasonal allergic rhinitis 05/14/2021   Claustrophobia 09/26/2020   Atrophic vaginitis 09/13/2020   Family history of muscular dystrophy 09/13/2020   Fibromyalgia 09/13/2020   Benign hypertension 09/13/2020   Irregular heart beat 09/13/2020   Mixed hyperlipidemia 09/13/2020   Nephrosclerosis 09/13/2020   Obesity 09/13/2020   Sinusitis 09/13/2020   Vitamin D deficiency 09/13/2020   Difficulty with CPAP full face mask use 06/06/2020   Hepatic steatosis 06/06/2020   Blurry vision 06/03/2020   Difficulty walking 06/03/2020   Dizziness 06/03/2020   Headache disorder  06/03/2020   OSA (obstructive sleep apnea) 03/28/2020   Complex renal cyst 03/16/2020   Tubulovillous adenoma of colon 12/14/2019   Blood on toilet paper 11/11/2019   Encounter for follow-up examination after completed treatment for conditions other than malignant neoplasm 11/11/2019   Hematochezia 11/11/2019   Family history of cancer 10/19/2019   Polycythemia 10/19/2019   Facial weakness 02/16/2019   Migraine with aura 02/16/2019     PCP: Silvio Ramp MD  REFERRING PROVIDER: Murray MART DIAG: M79.7 (ICD-10-CM) - Fibromyalgia   Rationale for Evaluation and Treatment: Rehabilitation  THERAPY DIAG:  Muscle weakness (generalized)  Other abnormalities of gait and mobility  Unsteadiness on feet  ONSET DATE:  fibro since 26; new pain in leg and LB march 2025  SUBJECTIVE:                                                                                                                                                                                           SUBJECTIVE STATEMENT: Fibromyalgia pain moved into legs and lb hadn't been there before this past March.  Saw neurosurgery but they really didn't find anything.  No MS or spine issues. I am just weak.  Can't open bottles or cans.  Fall often. I've been to PT I n past and it made it worse  PERTINENT HISTORY:  Fibromyalgia falls  PAIN:  Are you having pain? Yes: NPRS scale: current 9/10 Pain location: general Pain description: excruciating pain constant Aggravating factors: movement Relieving factors: nothing  PRECAUTIONS: Fall  RED FLAGS: None   WEIGHT BEARING RESTRICTIONS: No  FALLS:  Has patient fallen in last 6 months? Yes. Number of falls 10 just walking and leg give  LIVING ENVIRONMENT: Lives with: lives with their family Lives in: House/apartment Stairs: No Has following equipment at home: Walker - 4 wheeled cane  OCCUPATION: substitute teacher  ut have  ot been able to work since March  PLOF: Needs assistance with ADLs and Needs assistance with gait  PATIENT GOALS: IDK  NEXT MD VISIT: Aug 18  OBJECTIVE:  Note: Objective measures were completed at Evaluation unless otherwise noted.  DIAGNOSTIC FINDINGS:  MRI 6/25 cervical spine  FINDINGS: Seven cervical segments are well visualized. Prior cervical fusion at C5-6 and C6-7 are again seen with anterior fixation. No hardware failure is noted. Mild neural foraminal narrowing is  noted at C4-5 and C5-6 on the right. The odontoid is within normal limits. Facet hypertrophic changes are noted at multiple levels. No soft tissue abnormality is seen.   IMPRESSION: Degenerative and postsurgical changes in the cervical spine. Neural foraminal narrowing is seen as described.  Thoracic  MRI 01/09/24 IMPRESSION: Mild disc disease without significant protrusion, foraminal or spinal stenosis. No acute abnormality.   Lumbar MRI 01/09/24 IMPRESSION: No disc protrusion or evidence of impingement.   L4-5 has mild bilateral degenerative foraminal narrowing.   Mild degenerative disc disease throughout.  PATIENT SURVEYS:  LEFS  Extreme difficulty/unable (0), Quite a bit of difficulty (1), Moderate difficulty (2), Little difficulty (3), No difficulty (4) Survey date:    Any of your usual work, housework or school activities   2. Usual hobbies, recreational or sporting activities   3. Getting into/out of the bath   4. Walking between rooms   5. Putting on socks/shoes   6. Squatting    7. Lifting an object, like a bag of groceries from the floor   8. Performing light activities around your home   9. Performing heavy activities around your home   10. Getting into/out of a car   11. Walking 2 blocks   12. Walking 1 mile   13. Going up/down 10 stairs (1 flight)   14. Standing for 1 hour   15.  sitting for 1 hour   16. Running on even ground   17. Running on uneven ground   18. Making sharp turns while running fast   19. Hopping    20. Rolling over in bed   Score total:  9/80     COGNITION: Overall cognitive status: Within functional limits for tasks assessed     SENSATION: WFL  MUSCLE LENGTH:   POSTURE: rounded shoulders and flexed trunk   PALPATION: No TTP  LUMBAR ROM:  Unable to stand unsupported    LOWER EXTREMITY ROM:     Bilat knee extension limitations due to pain in knees and le  LOWER EXTREMITY MMT:    MMT Right eval Left eval  Hip  flexion 3- 3-  Hip extension    Hip abduction 3+ 3+  Hip adduction 3+ 3+  Hip internal rotation    Hip external rotation    Knee flexion 3- 3-  Knee extension    Ankle dorsiflexion 3- 3-  Ankle plantarflexion    Ankle inversion    Ankle eversion     (Blank rows = not tested)   FUNCTIONAL TESTS:  Tug: 57.19 using rollator STS from bench heavy use of UE Standing unsupported balance x 5s  GAIT: Distance walked: 400 ft using rollator Assistive device utilized: Walker - 4 wheeled Level of assistance: Modified independence Comments: Pt reports pushing through to make distance without rest but would have preferred a rest period  TREATMENT  Eval Self care:Posture and body mechanic instruction; toleration to activity, safety with use of ad (locking, sitting); fall risk; Adding back bar to rollator; adjusted height of rollator                                                                                                                                PATIENT EDUCATION:  Education details: Discussed eval findings, rehab rationale, aquatic program progression/POC and pools in area. Patient is in agreement  Person educated: Patient Education method: Explanation Education comprehension: verbalized understanding  HOME EXERCISE PROGRAM: TBA  ASSESSMENT:  CLINICAL IMPRESSION: Patient is a 58 y.o. f who was seen today for physical therapy evaluation and treatment for fibromyalgia.  She presents today using Rolator to setting with reports of extreme fatigue.  She has had an unintentional 90lb weight loss in past 4 months with an onset of extreme pain and weakness in LE and LB.  Hx of fibromyalgia with pain sensitivity primarily in shoulders and cervical spine.  She has had multiple diagnostics completed without any new identified dysfunction/etiology for new symptoms.  She is very weak and unable to tolerate most objective and functional testing.  Discussed baseline level of activity  required for participation of aquatics therapy (500 ft to and from setting, changing, actual aquatic treatment then return home) with concern of ultimate toleration.  She VU of need of cg for transport to and from setting in [place] if using. She has agreed to trial and to be seen x 1 a week to begin.  She is a good candidate for aquatic intervention and will benefit from the properties of water to progress towards functional goals if tolerated.   OBJECTIVE IMPAIRMENTS: Abnormal gait, decreased activity tolerance, decreased balance, decreased endurance, decreased mobility, difficulty walking, decreased strength, and pain.   ACTIVITY LIMITATIONS: carrying, lifting, bending, standing, squatting, sleeping, stairs, transfers, bed mobility, dressing, locomotion level, and caring for others  PARTICIPATION LIMITATIONS: meal prep, cleaning, laundry, driving, shopping, community activity, occupation, and yard work  PERSONAL FACTORS: see PmhX are also affecting patient's functional outcome.   REHAB POTENTIAL: Fair to good. Uncertain pt will tolerate activity related to aquatic intervention  CLINICAL DECISION MAKING: Evolving/moderate complexity  EVALUATION COMPLEXITY: Moderate   GOALS: Goals reviewed with patient? Yes  SHORT TERM GOALS: Target date: 03/31/24  Pt will tolerate full aquatic sessions consistently without increase in pain and with improving function to demonstrate good toleration and effectiveness of intervention.  Baseline: Goal status: INITIAL    LONG TERM GOALS: Target date: 04/23/24  Pt to improve on LEFS by at least 9 point to demonstrate statistically significant Improvement in function. Baseline: 9/80 Goal status: INITIAL  2.  Pt will improve strength in of LE by 1 grade to demonstrate improved overall physical function Baseline:  Goal status: INITIAL  3.  Pt will tolerate walking to or from setting and engaging in aquatic therapy session without excessive fatigue or  increase in pain to demonstrate improved toleration to activity. Baseline: WC Goal status: INITIAL  4.  Pt will report a reduction of overall pain by 25% Baseline:  Goal status: INITIAL   PLAN:  PT FREQUENCY: 1x/week  PT DURATION:8 weeks 6 visit likely  PLANNED INTERVENTIONS: 97164- PT Re-evaluation, 97750- Physical Performance Testing, 97110-Therapeutic exercises, 97530- Therapeutic activity, [id number]- Neuromuscular re-education, 97535- Self Care, [id number]- Manual therapy, [id number]- Gait training, [phone]- Aquatic Therapy, [phone]- Electrical stimulation (manual), [phone] (1-2 muscles), [id number] (3+ muscles)- Dry Needling, Patient/Family education, Balance training, Stair training, Taping, Joint mobilization, DME instructions, Cryotherapy, and Moist heat.  PLAN FOR NEXT SESSION: Trail aquatic for general strengthening, toleration to activity and pain reduction   Etta Gassett) Tyvion Edmondson MPT 02/26/24 6:08 PM [facility] [address] [place], [place], [address] Phone: [phone]   Fax:  [phone]   For all possible CPT codes, reference the Planned Interventions line  above.     Check all conditions that are expected to impact treatment: {Conditions expected to impact treatment:Musculoskeletal disorders, Neurological condition and/or seizures, and Social determinants of health   If treatment provided at initial evaluation, no treatment charged due to lack of authorization.

## 2024-02-26 ENCOUNTER — Encounter (HOSPITAL_BASED_OUTPATIENT_CLINIC_OR_DEPARTMENT_OTHER): Payer: Self-pay | Admitting: Physical Therapy

## 2024-02-26 ENCOUNTER — Ambulatory Visit: Admitting: Physical Medicine & Rehabilitation

## 2024-03-01 ENCOUNTER — Ambulatory Visit (INDEPENDENT_AMBULATORY_CARE_PROVIDER_SITE_OTHER): Admitting: Neurology

## 2024-03-01 ENCOUNTER — Encounter: Payer: Self-pay | Admitting: Neurology

## 2024-03-01 VITALS — BP 124/86 | Ht 68.0 in | Wt 159.0 lb

## 2024-03-01 DIAGNOSIS — G7100 Muscular dystrophy, unspecified: Secondary | ICD-10-CM | POA: Insufficient documentation

## 2024-03-01 DIAGNOSIS — R531 Weakness: Secondary | ICD-10-CM | POA: Insufficient documentation

## 2024-03-01 DIAGNOSIS — R52 Pain, unspecified: Secondary | ICD-10-CM | POA: Insufficient documentation

## 2024-03-01 MED ORDER — DULOXETINE HCL 60 MG PO CPEP: 60.0000 mg | ORAL_CAPSULE | Freq: Every day | ORAL | 11 refills | Status: DC

## 2024-03-01 MED ORDER — NORTRIPTYLINE HCL 10 MG PO CAPS: 20.0000 mg | ORAL_CAPSULE | Freq: Every day | ORAL | 11 refills | Status: AC

## 2024-03-01 NOTE — Progress Notes (Signed)
 Chief Complaint  Patient presents with   New Patient (Initial Visit)    Rm 14, along, NP, constant all over,      ASSESSMENT AND PLAN  Anita Michael is a 58 y.o. female   Body pain, Gait abnormality frequent falling Strong family history of myotonic muscular dystrophy  On examination, she has bilateral temporal wasting, high arch hard palate, moderate bulbar weakness, mild to moderate proximal upper lower extremity muscle weakness, profound distal finger flexion weakness, mild thenar myotonic activity with palpitation  Most suggestive of myotonic muscular dystrophy, especially in light of her strong family history  Genetic testing for myotonic muscular dystrophy  EMG/NCS  Cymbalta  for muscle pain  DIAGNOSTIC DATA (LABS, IMAGING, TESTING) - I reviewed patient records, labs, notes, testing and imaging myself where available.   MEDICAL HISTORY:  Anita Michael, is a 58 year old female, came in with walker, driven by her son, alone at today's clinical visit, seen in request by her primary care nurse practitioner Bucio, Elsa for evaluation of body achy pain, gait abnormality, frequent falling, initial evaluation was March 01, 2024    History is obtained from the patient and review of electronic medical records. I personally reviewed pertinent available imaging films in PACS.   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  She was found to have moderate bulbar, proximal upper and lower extremity muscle weakness, profound distal finger flexion weakness, also reported difficulty release of tight grip sometimes, suggestive of myotonic muscular  dystrophy,  She did report strong family history of muscular dystrophy, she is a mother of 55 children, age from 46-44, 2 of her sons at age 21 and 24 carried a diagnosis of myotonic muscular dystrophy, no genetic confirmation per patient, they both have significant gait abnormality,  She has 5 siblings, 2 of her sisters has mild tonic muscular dystrophy  Her father has muscular dystrophy, died at age 59 due to cardiac muscle involvement,  She had a history of cervical decompression surgery in July 2024: Presurgical MRI of cervical spine November 13, 2021 showed degenerative disc osteophyte at C5-6, with severe spinal stenosis, severe right, moderate left C6 foraminal narrowing, patchy signal abnormality within the cervical cord   MRI of the brain in 2025, no acute intracranial abnormality MRI of cervical spine anterior cervical fusion dissect me from C5-7, mild atrophy at C6 with mild increased signal abnormality likely reflect chronic myelomalacia, AP diameter at C6-7 7 mm, with effacement of ventral thecal sac with moderate canal stenosis,   MRI of thoracic spine essentially normal,  MRI of lumbar spine, mild degenerative changes, no evidence of nerve roots or cord compression. Laboratory evaluation in 2025, CPK was 22, hemoglobin of 14.9, TSH was normal, BMP was essentially normal with exception of mildly elevated glucose 136   PHYSICAL EXAM:   Vitals:   03/01/24 1054  BP: 124/86  Weight: 159 lb (72.1 kg)  Height: 5' 8 (1.727 m)   Body mass index is 24.18 kg/m.  PHYSICAL EXAMNIATION:  Gen: NAD, conversant, well nourised, well groomed                     Cardiovascular: Regular rate rhythm, no  peripheral edema, warm, nontender. Eyes: Conjunctivae clear without exudates or hemorrhage Neck: Supple, no carotid bruits. Pulmonary: Clear to auscultation bilaterally   NEUROLOGICAL EXAM:  MENTAL STATUS: Speech/cognition: Awake, alert, oriented to history taking and casual  conversation CRANIAL NERVES: CN II: Visual fields are full to confrontation. Pupils are round equal and briskly reactive to light. CN III, IV, VI: extraocular movement are normal. No ptosis. CN V: Facial sensation is intact to light touch CN VII: Mild bilateral temporal wasting, high arch hard palate, moderate eye closure, cheek puff weakness, mild CN VIII: Hearing is normal to causal conversation. CN IX, X: Phonation is normal. CN XI: Head turning and shoulder shrug are intact  MOTOR: Neck flexion weakness, mild proximal upper lower extremity muscle weakness, profound bilateral distal finger flexor weakness  REFLEXES: Reflexes are 1 and symmetric at the biceps, triceps, 2/2 knees, and absent at ankles. Plantar responses are flexor.  SENSORY: Intact to light touch, pinprick and vibratory sensation are intact in fingers and toes.  COORDINATION: There is no trunk or limb dysmetria noted.  GAIT/STANCE: Need push-up to get up from seated position, unsteady rely on her walker  REVIEW OF SYSTEMS:  Full 14 system review of systems performed and notable only for as above All other review of systems were negative.   ALLERGIES: Allergies  Allergen Reactions   Nifedipine Other (See Comments) and Palpitations    Heart races   Latex Rash   Neurontin  [Gabapentin ] Swelling and Rash    HOME MEDICATIONS: Current Outpatient Medications  Medication Sig Dispense Refill   aspirin  EC 81 MG EC tablet Take 1 tablet (81 mg total) by mouth daily. Swallow whole. 30 tablet 11   atorvastatin  (LIPITOR ) 80 MG tablet TAKE 1 TABLET BY MOUTH DAILY AT 6PM. 90 tablet 3   cetirizine (ZYRTEC) 10 MG tablet Take 10 mg by mouth daily as needed for allergies.     cholecalciferol (VITAMIN D3) 25 MCG (1000 UT) tablet Take 1,000 Units by mouth daily in the afternoon.     DULoxetine  (CYMBALTA ) 30 MG capsule 1 capsule Orally Once a day for 30 days     ezetimibe  (ZETIA ) 10 MG tablet Take 10 mg by mouth daily.      fluticasone  (FLONASE ) 50 MCG/ACT nasal spray Place 2 sprays into both nostrils daily. (Patient taking differently: Place 2 sprays into both nostrils daily as needed for allergies.) 16 g 0   isosorbide  mononitrate (IMDUR ) 30 MG 24 hr tablet Take 1 tablet (30 mg total) by mouth daily. 90 tablet 2   losartan  (COZAAR ) 100 MG tablet Take 1 tablet (100 mg total) by mouth daily. 90 tablet 2   methocarbamol  (ROBAXIN ) 500 MG tablet Take 1 tablet (500 mg total) by mouth every 8 (eight) hours as needed for muscle spasms. This can make you sleepy. 30 tablet 0   nitroGLYCERIN  (NITROSTAT ) 0.4 MG SL tablet Place 1 tablet (0.4 mg total) under the tongue every 5 (five) minutes x 3 doses as needed for chest pain. 25 tablet 2   nystatin (MYCOSTATIN/NYSTOP) powder 1 application. 2 (two) times daily as needed (skin irritation).     potassium chloride  SA (KLOR-CON  M) 20 MEQ tablet Take 2 tablets (40 mEq total) by mouth daily. 180 tablet 3   traMADol  (ULTRAM ) 50 MG tablet Take 1 tablet (50 mg total) by mouth every 12 (twelve) hours as needed. 60 tablet 0   magnesium  oxide (MAGOX 400) 400 (240 Mg) MG tablet Take 1 tablet (400 mg total) by mouth daily. (  Patient not taking: Reported on 03/01/2024) 90 tablet 3   Vibegron  (GEMTESA ) 75 MG TABS Take 1 capsule by mouth daily. (Patient not taking: Reported on 03/01/2024) 90 tablet 3   No current facility-administered medications for this visit.    PAST MEDICAL HISTORY: Past Medical History:  Diagnosis Date   CAD (coronary artery disease)    a. s/p NSTEMI in 06/2021 with DES to mid-RCA. Residual disease along D1 and 1st Mrg with medical management recommended.   Essential hypertension    Fibromyalgia    Generalized headaches    History of stroke    Noted incidentally by brain MRI July 2022   Mitral regurgitation    a. moderate to severe by echo in 06/2021   Polycythemia    PVC's (premature ventricular contractions)    Renal insufficiency    Sleep apnea    Stroke [facility])      PAST SURGICAL HISTORY: Past Surgical History:  Procedure Laterality Date   ANKLE SURGERY Right    as a child   ANTERIOR CERVICAL DECOMP/DISCECTOMY FUSION N/A 02/06/2023   Procedure: Cervical Five-Cervical Six, Cervical Six-Cervical Seven. Anterior Cervical Decompression/Discectomy Fusion;  Surgeon: Debby Dorn MATSU, MD;  Location: [facility] OR;  Service: Neurosurgery;  Laterality: N/A;   APPENDECTOMY     BREAST BIOPSY Right 2015   Fibroadenoma   CHOLECYSTECTOMY     CORONARY STENT INTERVENTION N/A 07/06/2021   Procedure: CORONARY STENT INTERVENTION;  Surgeon: Wonda Sharper, MD;  Location: [facility] INVASIVE CV LAB;  Service: Cardiovascular;  Laterality: N/A;   DRUG INDUCED ENDOSCOPY Bilateral 08/06/2022   Procedure: DRUG INDUCED ENDOSCOPY;  Surgeon: Carlie Clark, MD;  Location: [facility];  Service: ENT;  Laterality: Bilateral;   LEFT HEART CATH AND CORONARY ANGIOGRAPHY N/A 07/06/2021   Procedure: LEFT HEART CATH AND CORONARY ANGIOGRAPHY;  Surgeon: Wonda Sharper, MD;  Location: [facility] INVASIVE CV LAB;  Service: Cardiovascular;  Laterality: N/A;   TONSILLECTOMY      FAMILY HISTORY: Family History  Problem Relation Age of Onset   Breast cancer Mother 17   Breast cancer Sister 69   Breast cancer Maternal Grandmother 90   Breast cancer Sister 3    SOCIAL HISTORY: Social History   Socioeconomic History   Marital status: Widowed    Spouse name: Not on file   Number of children: 6   Years of education: Not on file   Highest education level: Not on file  Occupational History   Not on file  Tobacco Use   Smoking status: Every Day    Current packs/day: 0.00    Types: Cigarettes    Last attempt to quit: 02/07/2019    Years since quitting: 5.0   Smokeless tobacco: Never   Tobacco comments:    1 cigarette a day   Vaping Use   Vaping status: Never Used  Substance and Sexual Activity   Alcohol use: Yes    Comment: 1x a year   Drug use: No   Sexual activity: Not  Currently    Birth control/protection: Post-menopausal  Other Topics Concern   Not on file  Social History Narrative   Right handed   Caffeine- 1 cup daily   Works as Lawyer   Lives with grandson      Social Drivers of Health   Financial Resource Strain: Medium Risk (07/27/2021)   Overall Financial Resource Strain (CARDIA)    Difficulty of Paying Living Expenses: Somewhat hard  Food Insecurity: No Food Insecurity (07/27/2021)   Hunger Vital  Sign    Worried About Programme researcher, broadcasting/film/video in the Last Year: Never true    Ran Out of Food in the Last Year: Never true  Transportation Needs: No Transportation Needs (07/27/2021)   PRAPARE - Administrator, Civil Service (Medical): No    Lack of Transportation (Non-Medical): No  Physical Activity: Inactive (07/27/2021)   Exercise Vital Sign    Days of Exercise per Week: 0 days    Minutes of Exercise per Session: 0 min  Stress: No Stress Concern Present (07/27/2021)   [employer] of Occupational Health - Occupational Stress Questionnaire    Feeling of Stress : Not at all  Social Connections: Moderately Isolated (07/27/2021)   Social Connection and Isolation Panel    Frequency of Communication with Friends and Family: Twice a week    Frequency of Social Gatherings with Friends and Family: Once a week    Attends Religious Services: 1 to 4 times per year    Active Member of [employer] or Organizations: No    Attends Banker Meetings: Never    Marital Status: Widowed  Intimate Partner Violence: Not At Risk (07/27/2021)   Humiliation, Afraid, Rape, and Kick questionnaire    Fear of Current or Ex-Partner: No    Emotionally Abused: No    Physically Abused: No    Sexually Abused: No      Modena Callander, M.D. Ph.D.  [facility] [address], [address] [place], [place] [address] Ph: [phone] Fax: [phone]  CC:  Alston Silvio BROCKS, FNP [address] [place],  [place] [address]  Bucio, Elsa C, FNP

## 2024-03-08 ENCOUNTER — Encounter: Payer: Self-pay | Admitting: Physical Medicine & Rehabilitation

## 2024-03-08 ENCOUNTER — Encounter: Attending: Physical Medicine & Rehabilitation | Admitting: Physical Medicine & Rehabilitation

## 2024-03-08 VITALS — BP 108/70 | HR 92 | Resp 18 | Ht 68.0 in | Wt 156.6 lb

## 2024-03-08 DIAGNOSIS — M797 Fibromyalgia: Secondary | ICD-10-CM | POA: Insufficient documentation

## 2024-03-08 DIAGNOSIS — G894 Chronic pain syndrome: Secondary | ICD-10-CM | POA: Insufficient documentation

## 2024-03-08 MED ORDER — TRAMADOL HCL 50 MG PO TABS: 50.0000 mg | ORAL_TABLET | Freq: Three times a day (TID) | ORAL | 1 refills | Status: DC | PRN

## 2024-03-08 MED ORDER — TRAMADOL HCL 50 MG PO TABS
50.0000 mg | ORAL_TABLET | Freq: Two times a day (BID) | ORAL | 0 refills | Status: DC | PRN
Start: 2024-03-08 — End: 2024-03-08

## 2024-03-08 NOTE — Progress Notes (Addendum)
 HPI  Anita Michael is a 58 y.o. year old female  who  has a past medical history of CAD (coronary artery disease), Essential hypertension, Fibromyalgia, Generalized headaches, History of stroke, Mitral regurgitation, Polycythemia, PVC's (premature ventricular contractions), Renal insufficiency, Sleep apnea, and Stroke (HCC).   They are presenting to PM&R clinic as a new patient for pain management evaluation. They were referred by Silvio Ramp, NP for treatment of chronic bodywide pain pain.  Thank you for referral of this patient.  Patient is here with her daughter.  She reports that she has long hx of fibromyalgia, being diagnosed with this condition when she was 26.   Patient says for a long time that her issues with pain were primarily in her shoulders, arms and neck.  Around March 2025 her pain became worse and spread to her lower back and legs.  She reports feeling weak throughout her body and had several falls, now walking with a rolling walker.  Patient reports she is walking with much better stability since she has started using the rolling walker.  Her pain is particularly severe at night.  She was previously on Cymbalta  for pain, this medication was restarted last month.  She is not sure how much Cymbalta  is helping.  She feels like she is tolerating Cymbalta  overall but it does occasionally cause mild nausea.    She had MRI of L spine, C spine and T spine completed a few days ago- results pending.  This study was ordered by neurosurgery.   Patient reports she was previously doing some light exercises but she stopped this in April due to the pain.  Patient had a ACDF C5-7 on 02/06/2023  by Dorn Ned for cervical myelopathy.  She reports that her upper extremity pain and strength did improve after the surgery.  Denies bowel or bladder incontinence.  Patient has used tramadol   which did take the edge off her pain.  Patient worked with physical therapy last year, reports this greatly  worsened her pain after doing it.  She did try aquatic therapy in the past and found this to be helpful.   Reports she saw rheumatology in the past who felt fibromyalgia was likely diagnosis.  Red flag symptoms: No red flags for back pain endorsed in Hx or ROS  Medications tried: Topical medications Aspercream helps a little  Nsaids -  not recently  Tylenol   - Tylenol - helps slightly  Opiates - Tramadol - helped a little  Gabapentin  - swelling Lyrica - didn't help , put me in [place] TCAs - Does not recall  SNRIs - Taking cymbalta - some nausea with it  Robaxin  helped a little  Other treatments: PT- Last year after surgery, caused a lot of pain. Pool therapy helped in the past.  TENs unit- denies Injections- denies Surgery - C spine surgery in 2024 as above    Prior UDS results: No results found for: LABOPIA, COCAINSCRNUR, LABBENZ, AMPHETMU, THCU, LABBARB   Interval History 03/08/24 Reports pain all over body, present pretty much all the time. Tramadol   provides variable relief - sometimes works a little, sometimes doesn't seem to work as well. Pain levels range from lowest of 7 when medication effective to above 10 when medication ineffective. Tramadol  duration 4-6 hours when working.  Continues Cymbalta , recently increased to 60mg  from 30mg  by neurology. Still experiencing nausea from Cymbalta  but reports able to tolerate medication and now able to eat. Uncertain about pain relief benefit from Cymbalta .  Sleep remains poor.  Recently started seeing neurologist who prescribed sleep medication (nortriptyline , 2 capsules at night) approximately 1 week ago. No improvement in pain noted from nortriptyline . She is sleeping better.   Discontinued Robaxin  (methocarbamol ) muscle relaxer - not using much.  Physical therapy scheduled to start 03/22/2024 (aquatic therapy).  Performs seated exercises including arm movements and getting up/down from chair.  Neurology  evaluating for myotonic muscular dystrophy   Pain Inventory Average Pain 10 Pain Right Now 10 My pain is constant  In the last 24 hours, has pain interfered with the following? General activity 8 Relation with others 0 Enjoyment of life 7 What TIME of day is your pain at its worst? morning , daytime, evening, and night Sleep (in general) Poor  Pain is worse with: walking, bending, sitting, inactivity, standing, and some activites Pain improves with: medication Relief from Meds: 5  walk with assistance use a cane use a walker how many minutes can you walk? 3-4 ability to climb steps?  no do you drive?  yes  employed # of hrs/week . 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  no    Family History  Problem Relation Age of Onset   Breast cancer Mother 51   Breast cancer Sister 50   Breast cancer Maternal Grandmother 82   Breast cancer Sister 36   Social History   Socioeconomic History   Marital status: Widowed    Spouse name: Not on file   Number of children: 6   Years of education: Not on file   Highest education level: Not on file  Occupational History   Not on file  Tobacco Use   Smoking status: Every Day    Current packs/day: 0.00    Types: Cigarettes    Last attempt to quit: 02/07/2019    Years since quitting: 5.0   Smokeless tobacco: Never   Tobacco comments:    1 cigarette a day   Vaping Use   Vaping status: Never Used  Substance and Sexual Activity   Alcohol use: Yes    Comment: 1x a year   Drug use: No   Sexual activity: Not Currently    Birth control/protection: Post-menopausal  Other Topics Concern   Not on file  Social History Narrative   Right handed   Caffeine- 1 cup daily   Works as Lawyer   Lives with grandson      Social Drivers of Health   Financial  Resource Strain: Medium Risk (07/27/2021)   Overall Financial Resource Strain (CARDIA)    Difficulty of Paying Living Expenses: Somewhat hard  Food Insecurity: No Food Insecurity (07/27/2021)   Hunger Vital Sign    Worried About Running Out of Food in the Last Year: Never true    Ran Out of Food in the Last Year: Never true  Transportation Needs: No Transportation Needs (07/27/2021)   PRAPARE - Administrator, Civil Service (Medical): No    Lack of Transportation (Non-Medical): No  Physical Activity: Inactive (07/27/2021)   Exercise Vital Sign    Days of Exercise per Week: 0 days    Minutes of Exercise per Session: 0 min  Stress: No Stress Concern Present (07/27/2021)   [employer] of Occupational Health - Occupational Stress Questionnaire    Feeling of Stress : Not at all  Social Connections: Moderately Isolated (07/27/2021)  Social Advertising account executive    Frequency of Communication with Friends and Family: Twice a week    Frequency of Social Gatherings with Friends and Family: Once a week    Attends Religious Services: 1 to 4 times per year    Active Member of [employer] or Organizations: No    Attends Banker Meetings: Never    Marital Status: Widowed   Past Surgical History:  Procedure Laterality Date   ANKLE SURGERY Right    as a child   ANTERIOR CERVICAL DECOMP/DISCECTOMY FUSION N/A 02/06/2023   Procedure: Cervical Five-Cervical Six, Cervical Six-Cervical Seven. Anterior Cervical Decompression/Discectomy Fusion;  Surgeon: Debby Dorn MATSU, MD;  Location: [facility] OR;  Service: Neurosurgery;  Laterality: N/A;   APPENDECTOMY     BREAST BIOPSY Right 2015   Fibroadenoma   CHOLECYSTECTOMY     CORONARY STENT INTERVENTION N/A 07/06/2021   Procedure: CORONARY STENT INTERVENTION;  Surgeon: Wonda Sharper, MD;  Location: [facility] INVASIVE CV LAB;  Service: Cardiovascular;  Laterality: N/A;   DRUG INDUCED ENDOSCOPY Bilateral 08/06/2022   Procedure: DRUG INDUCED  ENDOSCOPY;  Surgeon: Carlie Clark, MD;  Location: [facility];  Service: ENT;  Laterality: Bilateral;   LEFT HEART CATH AND CORONARY ANGIOGRAPHY N/A 07/06/2021   Procedure: LEFT HEART CATH AND CORONARY ANGIOGRAPHY;  Surgeon: Wonda Sharper, MD;  Location: [facility] INVASIVE CV LAB;  Service: Cardiovascular;  Laterality: N/A;   TONSILLECTOMY     Past Medical History:  Diagnosis Date   CAD (coronary artery disease)    a. s/p NSTEMI in 06/2021 with DES to mid-RCA. Residual disease along D1 and 1st Mrg with medical management recommended.   Essential hypertension    Fibromyalgia    Generalized headaches    History of stroke    Noted incidentally by brain MRI July 2022   Mitral regurgitation    a. moderate to severe by echo in 06/2021   Polycythemia    PVC's (premature ventricular contractions)    Renal insufficiency    Sleep apnea    Stroke (HCC)    BP 108/70   Pulse 92   Resp 18   Ht 5' 8 (1.727 m)   Wt 156 lb 9.6 oz (71 kg)   SpO2 96%   BMI 23.81 kg/m   Opioid Risk Score:   Fall Risk Score:  `1  Depression screen PHQ 2/9     03/08/2024    1:37 PM 01/12/2024    1:19 PM 09/27/2021    1:30 PM 07/27/2021   10:29 AM  Depression screen PHQ 2/9  Decreased Interest 0 0 0 0  Down, Depressed, Hopeless 0 0 0 0  PHQ - 2 Score 0 0 0 0  Altered sleeping 3 3 0 0  Tired, decreased energy 1 1 3 3   Change in appetite 0 1 0 1  Feeling bad or failure about yourself  0 0 0 0  Trouble concentrating 0 0 0 0  Moving slowly or fidgety/restless 0 0 0 0  Suicidal thoughts 0 0 0 0  PHQ-9 Score 4 5 3 4   Difficult doing work/chores Somewhat difficult Very difficult Very difficult      Subjective:    Patient ID: Anita Michael, female    DOB: 01/30/1966, 58 y.o.   MRN: [id number]  HPI    Review of Systems  Constitutional:  Positive for appetite change.  Respiratory:  Positive for apnea.   Gastrointestinal:  Positive for constipation and nausea.  Endocrine:  Sometimes low  blood sugar  Musculoskeletal:  Positive for gait problem.       Spasms  Neurological:  Positive for dizziness and weakness.  Psychiatric/Behavioral:  Positive for dysphoric mood.   All other systems reviewed and are negative.      Objective:   Physical Exam Gen: no distress, normal appearing HEENT: oral mucosa pink and moist, NCAT Chest: normal effort, normal rate of breathing Abd: soft, non-distended Ext: no edema Psych: pleasant, flat affect Skin: Purple discoloration in her bilateral feet-patient reports this is chronic and she has had multiple negative vascular studies Neuro: Alert and awake, follows commands, cranial nerves II through XII grossly intact, normal speech and language RUE: 4/5 Deltoid, 4/5 Biceps, 4/5 Triceps, 4/5 Wrist Ext, 4/5 Grip LUE: 4/5 Deltoid, 4/5 Biceps, 4/5 Triceps, 4/5 Wrist Ext, 4/5 Grip RLE: HF 4/5, KE 4/5, ADF 3/5, APF 4/5 LLE: HF 4/5, KE 4/5, ADF 3/5, APF 4/5 Sensory exam normal for light touch and pain in all 4 limbs.  No abnormal tone noted Musculoskeletal:   TTP T-spine, L-spine and C-spine paraspinal muscles TTP throughout bilateral parascapular muscles TTP throughout bilateral upper and lower extremities Walking with rolling walker    MRI C spine 11/13/21  IMPRESSION: 1. Degenerative disc osteophyte at C5-6 with resultant severe spinal stenosis, with severe right and moderate left C6 foraminal narrowing. Patchy signal abnormality within the cervical spinal cord at this level consistent with compressive myelomalacia. 2. Right paracentral disc osteophyte at C6-7 with secondary flattening of the right hemi cord, but no visible cord signal changes at this level. Associated severe left C7 foraminal stenosis. 3. Moderate left C4 foraminal stenosis related to uncovertebral and facet disease. 4. Multilevel facet arthrosis throughout the cervical spine as above, most pronounced at C4-5 on the right. Findings could contribute to underlying  neck pain.    Assessment & Plan:   1) Fibromyalgia - Patient has widespread pain  2) Denies Depression  3) Sleep disorder, limited due to the pain   4) Chronic Lower Back pain    5) Chronic Neck Pain  -s/p ACDF C5-7 on 02/06/2023  by Dorn Ned  6) Muscle weakness  -Being evaluated for myotonic dystrophy by Neurology    -Opioid risk tool low -Food for pain discussed,list provided for foods that can be helpful for pain -Discussed low impact progressive exercise -Discussed trying yoga or tai chi -Referral for aquatic therapy placed today- pending  -Tens Unit recommended, Nexwave ordered -Advised not to take more tylenol  then 3000mg /day -DC Robaxin  PRN -Continue cymbalta  60mg  dasily- dose was increased by neurology  -Continue nortriptyline  20mg  HS started by neurology  -MRI C spine, L spine and T spine completed, pt following with NSGY and Neurology, being evaluated for myotonic muscular dystrophy -Pt reports she has used Hemp Cream- advised to discontinue -Pt reports her son gave her a pill for pain 2 weeks ago, she is not sure what this medication was. Advised against not using any medications not prescribed to her.  - -Continue UDS and pill counts.  Continue PDMP monitoring.  Pain contract completed prior visit. -Discussed bringing pill bottle with any medications even if empty to all appointments -Continue tramadol  50mg  increase to TID PRN

## 2024-03-16 ENCOUNTER — Ambulatory Visit (HOSPITAL_BASED_OUTPATIENT_CLINIC_OR_DEPARTMENT_OTHER): Admitting: Physical Therapy

## 2024-03-16 NOTE — Therapy (Incomplete)
 OUTPATIENT PHYSICAL THERAPY THORACOLUMBAR EVALUATION   Patient Name: Anita Michael MRN: [id number] DOB:[birth year], 58 y.o., female Today's Date: 03/16/2024  END OF SESSION:    Past Medical History:  Diagnosis Date   CAD (coronary artery disease)    a. s/p NSTEMI in 06/2021 with DES to mid-RCA. Residual disease along D1 and 1st Mrg with medical management recommended.   Essential hypertension    Fibromyalgia    Generalized headaches    History of stroke    Noted incidentally by brain MRI July 2022   Mitral regurgitation    a. moderate to severe by echo in 06/2021   Polycythemia    PVC's (premature ventricular contractions)    Renal insufficiency    Sleep apnea    Stroke [facility])    Past Surgical History:  Procedure Laterality Date   ANKLE SURGERY Right    as a child   ANTERIOR CERVICAL DECOMP/DISCECTOMY FUSION N/A 02/06/2023   Procedure: Cervical Five-Cervical Six, Cervical Six-Cervical Seven. Anterior Cervical Decompression/Discectomy Fusion;  Surgeon: Debby Dorn MATSU, MD;  Location: [facility] OR;  Service: Neurosurgery;  Laterality: N/A;   APPENDECTOMY     BREAST BIOPSY Right 2015   Fibroadenoma   CHOLECYSTECTOMY     CORONARY STENT INTERVENTION N/A 07/06/2021   Procedure: CORONARY STENT INTERVENTION;  Surgeon: Wonda Sharper, MD;  Location: [facility] INVASIVE CV LAB;  Service: Cardiovascular;  Laterality: N/A;   DRUG INDUCED ENDOSCOPY Bilateral 08/06/2022   Procedure: DRUG INDUCED ENDOSCOPY;  Surgeon: Carlie Clark, MD;  Location: [facility];  Service: ENT;  Laterality: Bilateral;   LEFT HEART CATH AND CORONARY ANGIOGRAPHY N/A 07/06/2021   Procedure: LEFT HEART CATH AND CORONARY ANGIOGRAPHY;  Surgeon: Wonda Sharper, MD;  Location: [facility] INVASIVE CV LAB;  Service: Cardiovascular;  Laterality: N/A;   TONSILLECTOMY     Patient Active Problem List   Diagnosis Date Noted   Weakness 03/01/2024   Muscular dystrophy (HCC) 03/01/2024   Pain 03/01/2024   Capillary  disease 02/25/2024   Family history of movement disorder 02/25/2024   Overweight 02/25/2024   Recurrent falls 02/25/2024   Seasonal allergies 02/25/2024   Antiplatelet or antithrombotic long-term use 07/31/2022   BMI 28.0-28.9,adult 07/31/2022   Hemorrhoids 08/21/2021   Constipation 08/21/2021   Herniation of rectum into vagina 08/21/2021   SUI (stress urinary incontinence, female) 08/21/2021   Vaginal pain 08/21/2021   PVC's (premature ventricular contractions) 07/07/2021   Tobacco use 07/06/2021   Mitral valve disorder 07/06/2021   NSTEMI (non-ST elevated myocardial infarction) (HCC) 07/06/2021   Other seasonal allergic rhinitis 05/14/2021   Claustrophobia 09/26/2020   Atrophic vaginitis 09/13/2020   Family history of muscular dystrophy 09/13/2020   Fibromyalgia 09/13/2020   Benign hypertension 09/13/2020   Irregular heart beat 09/13/2020   Mixed hyperlipidemia 09/13/2020   Nephrosclerosis 09/13/2020   Obesity 09/13/2020   Sinusitis 09/13/2020   Vitamin D deficiency 09/13/2020   Difficulty with CPAP full face mask use 06/06/2020   Hepatic steatosis 06/06/2020   Blurry vision 06/03/2020   Difficulty walking 06/03/2020   Dizziness 06/03/2020   Headache disorder 06/03/2020   OSA (obstructive sleep apnea) 03/28/2020   Complex renal cyst 03/16/2020   Tubulovillous adenoma of colon 12/14/2019   Blood on toilet paper 11/11/2019   Encounter for follow-up examination after completed treatment for conditions other than malignant neoplasm 11/11/2019   Hematochezia 11/11/2019   Family history of cancer 10/19/2019   Polycythemia 10/19/2019   Facial weakness 02/16/2019   Migraine with aura 02/16/2019  PCP: Silvio Ramp MD  REFERRING PROVIDER: Murray MART DIAG: M79.7 (ICD-10-CM) - Fibromyalgia   Rationale for Evaluation and Treatment: Rehabilitation  THERAPY DIAG:  No diagnosis found.  ONSET DATE:  fibro since 26; new pain in leg and LB march 2025  SUBJECTIVE:                                                                                                                                                                                            SUBJECTIVE STATEMENT: No changes   Initial Subjective Fibromyalgia pain moved into legs and lb hadn't been there before this past March.  Saw neurosurgery but they really didn't find anything.  No MS or spine issues. I am just weak.  Can't open bottles or cans.  Fall often. I've been to PT I n past and it made it worse  PERTINENT HISTORY:  Fibromyalgia falls  PAIN:  Are you having pain? Yes: NPRS scale: current 9/10 Pain location: general Pain description: excruciating pain constant Aggravating factors: movement Relieving factors: nothing  PRECAUTIONS: Fall  RED FLAGS: None   WEIGHT BEARING RESTRICTIONS: No  FALLS:  Has patient fallen in last 6 months? Yes. Number of falls 10 just walking and leg give  LIVING ENVIRONMENT: Lives with: lives with their family Lives in: House/apartment Stairs: No Has following equipment at home: Walker - 4 wheeled cane  OCCUPATION: substitute teacher  ut have  ot been able to work since March  PLOF: Needs assistance with ADLs and Needs assistance with gait  PATIENT GOALS: IDK  NEXT MD VISIT: Aug 18  OBJECTIVE:  Note: Objective measures were completed at Evaluation unless otherwise noted.  DIAGNOSTIC FINDINGS:  MRI 6/25 cervical spine  FINDINGS: Seven cervical segments are well visualized. Prior cervical fusion at C5-6 and C6-7 are again seen with anterior fixation. No hardware failure is noted. Mild neural foraminal narrowing is noted at C4-5 and C5-6 on the right. The odontoid is within normal limits. Facet hypertrophic changes are noted at multiple levels. No soft tissue abnormality is seen.   IMPRESSION: Degenerative and postsurgical changes in the cervical spine. Neural foraminal narrowing is seen as described.  Thoracic MRI  01/09/24 IMPRESSION: Mild disc disease without significant protrusion, foraminal or spinal stenosis. No acute abnormality.   Lumbar MRI 01/09/24 IMPRESSION: No disc protrusion or evidence of impingement.   L4-5 has mild bilateral degenerative foraminal narrowing.   Mild degenerative disc disease throughout.  PATIENT SURVEYS:  LEFS  Extreme difficulty/unable (0), Quite a bit of difficulty (1), Moderate difficulty (2), Little difficulty (3), No difficulty (4) Survey date:    Any of your usual  work, housework or school activities   2. Usual hobbies, recreational or sporting activities   3. Getting into/out of the bath   4. Walking between rooms   5. Putting on socks/shoes   6. Squatting    7. Lifting an object, like a bag of groceries from the floor   8. Performing light activities around your home   9. Performing heavy activities around your home   10. Getting into/out of a car   11. Walking 2 blocks   12. Walking 1 mile   13. Going up/down 10 stairs (1 flight)   14. Standing for 1 hour   15.  sitting for 1 hour   16. Running on even ground   17. Running on uneven ground   18. Making sharp turns while running fast   19. Hopping    20. Rolling over in bed   Score total:  9/80     COGNITION: Overall cognitive status: Within functional limits for tasks assessed     SENSATION: WFL  MUSCLE LENGTH:   POSTURE: rounded shoulders and flexed trunk   PALPATION: No TTP  LUMBAR ROM:  Unable to stand unsupported    LOWER EXTREMITY ROM:     Bilat knee extension limitations due to pain in knees and le  LOWER EXTREMITY MMT:    MMT Right eval Left eval  Hip flexion 3- 3-  Hip extension    Hip abduction 3+ 3+  Hip adduction 3+ 3+  Hip internal rotation    Hip external rotation    Knee flexion 3- 3-  Knee extension    Ankle dorsiflexion 3- 3-  Ankle plantarflexion    Ankle inversion    Ankle eversion     (Blank rows = not tested)   FUNCTIONAL TESTS:  Tug:  57.19 using rollator STS from bench heavy use of UE Standing unsupported balance x 5s  GAIT: Distance walked: 400 ft using rollator Assistive device utilized: Walker - 4 wheeled Level of assistance: Modified independence Comments: Pt reports pushing through to make distance without rest but would have preferred a rest period  TREATMENT  Eval Self care:Posture and body mechanic instruction; toleration to activity, safety with use of ad (locking, sitting); fall risk; Adding back bar to rollator; adjusted height of rollator                                                                                                                                PATIENT EDUCATION:  Education details: Discussed eval findings, rehab rationale, aquatic program progression/POC and pools in area. Patient is in agreement  Person educated: Patient Education method: Explanation Education comprehension: verbalized understanding  HOME EXERCISE PROGRAM: TBA  ASSESSMENT:  CLINICAL IMPRESSION: Pt demonstrates safety and independence in aquatic setting with therapist instructing from deck. Pt demonstrates confidence in setting, moving throughout all depths easily.  Pt is directed through various movement patterns and trials in both sitting and standing positions. ***  Goals are ongoing.     Initial Impression Patient is a 58 y.o. f who was seen today for physical therapy evaluation and treatment for fibromyalgia.  She presents today using Rolator to setting with reports of extreme fatigue.  She has had an unintentional 90lb weight loss in past 4 months with an onset of extreme pain and weakness in LE and LB.  Hx of fibromyalgia with pain sensitivity primarily in shoulders and cervical spine.  She has had multiple diagnostics completed without any new identified dysfunction/etiology for new symptoms.  She is very weak and unable to tolerate most objective and functional testing.  Discussed baseline level of  activity required for participation of aquatics therapy (500 ft to and from setting, changing, actual aquatic treatment then return home) with concern of ultimate toleration.  She VU of need of cg for transport to and from setting in [place] if using. She has agreed to trial and to be seen x 1 a week to begin.  She is a good candidate for aquatic intervention and will benefit from the properties of water to progress towards functional goals if tolerated.   OBJECTIVE IMPAIRMENTS: Abnormal gait, decreased activity tolerance, decreased balance, decreased endurance, decreased mobility, difficulty walking, decreased strength, and pain.   ACTIVITY LIMITATIONS: carrying, lifting, bending, standing, squatting, sleeping, stairs, transfers, bed mobility, dressing, locomotion level, and caring for others  PARTICIPATION LIMITATIONS: meal prep, cleaning, laundry, driving, shopping, community activity, occupation, and yard work  PERSONAL FACTORS: see PmhX are also affecting patient's functional outcome.   REHAB POTENTIAL: Fair to good. Uncertain pt will tolerate activity related to aquatic intervention  CLINICAL DECISION MAKING: Evolving/moderate complexity  EVALUATION COMPLEXITY: Moderate   GOALS: Goals reviewed with patient? Yes  SHORT TERM GOALS: Target date: 03/31/24  Pt will tolerate full aquatic sessions consistently without increase in pain and with improving function to demonstrate good toleration and effectiveness of intervention.  Baseline: Goal status: INITIAL    LONG TERM GOALS: Target date: 04/23/24  Pt to improve on LEFS by at least 9 point to demonstrate statistically significant Improvement in function. Baseline: 9/80 Goal status: INITIAL  2.  Pt will improve strength in of LE by 1 grade to demonstrate improved overall physical function Baseline:  Goal status: INITIAL  3.  Pt will tolerate walking to or from setting and engaging in aquatic therapy session without excessive fatigue  or increase in pain to demonstrate improved toleration to activity. Baseline: WC Goal status: INITIAL  4.  Pt will report a reduction of overall pain by 25% Baseline:  Goal status: INITIAL   PLAN:  PT FREQUENCY: 1x/week  PT DURATION:8 weeks 6 visit likely  PLANNED INTERVENTIONS: 97164- PT Re-evaluation, 97750- Physical Performance Testing, 97110-Therapeutic exercises, 97530- Therapeutic activity, [id number]- Neuromuscular re-education, 97535- Self Care, [id number]- Manual therapy, [id number]- Gait training, [phone]- Aquatic Therapy, [phone]- Electrical stimulation (manual), [phone] (1-2 muscles), [id number] (3+ muscles)- Dry Needling, Patient/Family education, Balance training, Stair training, Taping, Joint mobilization, DME instructions, Cryotherapy, and Moist heat.  PLAN FOR NEXT SESSION: Trail aquatic for general strengthening, toleration to activity and pain reduction   Hadia Minier) Trysten Bernard MPT 03/16/24 3:57 PM [facility] [address] [place], [place], [address] Phone: [phone]   Fax:  [phone]   For all possible CPT codes, reference the Planned Interventions line above.     Check all conditions that are expected to impact treatment: {Conditions expected to impact treatment:Musculoskeletal disorders, Neurological condition and/or seizures, and Social determinants of health  If treatment provided at initial evaluation, no treatment charged due to lack of authorization.

## 2024-03-19 LAB — ZNF9/CNBP REPEAT EXPANSION

## 2024-03-19 LAB — DMPK REPEAT EXPANSION

## 2024-03-23 ENCOUNTER — Telehealth: Payer: Self-pay | Admitting: Neurology

## 2024-03-23 NOTE — Telephone Encounter (Signed)
 Spoke w/lab [employer] who is reaching out to [facility] to see what the hold up is

## 2024-03-23 NOTE — Telephone Encounter (Signed)
  Please find her lab result from [facility] on ZNF9/CNBP repeat expansion and DMPK repeat expansion

## 2024-03-23 NOTE — Telephone Encounter (Signed)
 Miranda (Designer, industrial/product w/[facility]) was able to get [employer] and stated that the result should be coming soon

## 2024-03-25 ENCOUNTER — Encounter (HOSPITAL_BASED_OUTPATIENT_CLINIC_OR_DEPARTMENT_OTHER): Payer: Self-pay | Admitting: Physical Therapy

## 2024-03-25 ENCOUNTER — Ambulatory Visit (HOSPITAL_BASED_OUTPATIENT_CLINIC_OR_DEPARTMENT_OTHER): Payer: Self-pay | Attending: Physical Medicine & Rehabilitation | Admitting: Physical Therapy

## 2024-03-25 DIAGNOSIS — M6281 Muscle weakness (generalized): Secondary | ICD-10-CM | POA: Insufficient documentation

## 2024-03-25 DIAGNOSIS — R2689 Other abnormalities of gait and mobility: Secondary | ICD-10-CM | POA: Insufficient documentation

## 2024-03-25 DIAGNOSIS — R2681 Unsteadiness on feet: Secondary | ICD-10-CM | POA: Diagnosis present

## 2024-03-25 NOTE — Therapy (Addendum)
 OUTPATIENT PHYSICAL THERAPY THORACOLUMBAR TREATMENT   Patient Name: Anita Michael MRN: [id number] DOB:1966/05/16, 58 y.o., female Today's Date: 03/25/2024  END OF SESSION:  PT End of Session - 03/25/24 1154     Visit Number 2    Date for PT Re-Evaluation 04/23/24    Authorization Type [place] medicaid    PT Start Time 1120   pt arrived late to pool area   PT Stop Time 1145    PT Time Calculation (min) 25 min    Activity Tolerance Patient limited by fatigue;Patient limited by pain    Behavior During Therapy [facility] for tasks assessed/performed        Authorized 7 visits from 02/25/24-04/24/24  Past Medical History:  Diagnosis Date   CAD (coronary artery disease)    a. s/p NSTEMI in 06/2021 with DES to mid-RCA. Residual disease along D1 and 1st Mrg with medical management recommended.   Essential hypertension    Fibromyalgia    Generalized headaches    History of stroke    Noted incidentally by brain MRI July 2022   Mitral regurgitation    a. moderate to severe by echo in 06/2021   Polycythemia    PVC's (premature ventricular contractions)    Renal insufficiency    Sleep apnea    Stroke [facility])    Past Surgical History:  Procedure Laterality Date   ANKLE SURGERY Right    as a child   ANTERIOR CERVICAL DECOMP/DISCECTOMY FUSION N/A 02/06/2023   Procedure: Cervical Five-Cervical Six, Cervical Six-Cervical Seven. Anterior Cervical Decompression/Discectomy Fusion;  Surgeon: Debby Dorn MATSU, MD;  Location: [facility] OR;  Service: Neurosurgery;  Laterality: N/A;   APPENDECTOMY     BREAST BIOPSY Right 2015   Fibroadenoma   CHOLECYSTECTOMY     CORONARY STENT INTERVENTION N/A 07/06/2021   Procedure: CORONARY STENT INTERVENTION;  Surgeon: Wonda Sharper, MD;  Location: [facility] INVASIVE CV LAB;  Service: Cardiovascular;  Laterality: N/A;   DRUG INDUCED ENDOSCOPY Bilateral 08/06/2022   Procedure: DRUG INDUCED ENDOSCOPY;  Surgeon: Carlie Clark, MD;  Location: [facility];  Service:  ENT;  Laterality: Bilateral;   LEFT HEART CATH AND CORONARY ANGIOGRAPHY N/A 07/06/2021   Procedure: LEFT HEART CATH AND CORONARY ANGIOGRAPHY;  Surgeon: Wonda Sharper, MD;  Location: [facility] INVASIVE CV LAB;  Service: Cardiovascular;  Laterality: N/A;   TONSILLECTOMY     Patient Active Problem List   Diagnosis Date Noted   Weakness 03/01/2024   Muscular dystrophy (HCC) 03/01/2024   Pain 03/01/2024   Capillary disease 02/25/2024   Family history of movement disorder 02/25/2024   Overweight 02/25/2024   Recurrent falls 02/25/2024   Seasonal allergies 02/25/2024   Antiplatelet or antithrombotic long-term use 07/31/2022   BMI 28.0-28.9,adult 07/31/2022   Hemorrhoids 08/21/2021   Constipation 08/21/2021   Herniation of rectum into vagina 08/21/2021   SUI (stress urinary incontinence, female) 08/21/2021   Vaginal pain 08/21/2021   PVC's (premature ventricular contractions) 07/07/2021   Tobacco use 07/06/2021   Mitral valve disorder 07/06/2021   NSTEMI (non-ST elevated myocardial infarction) (HCC) 07/06/2021   Other seasonal allergic rhinitis 05/14/2021   Claustrophobia 09/26/2020   Atrophic vaginitis 09/13/2020   Family history of muscular dystrophy 09/13/2020   Fibromyalgia 09/13/2020   Benign hypertension 09/13/2020   Irregular heart beat 09/13/2020   Mixed hyperlipidemia 09/13/2020   Nephrosclerosis 09/13/2020   Obesity 09/13/2020   Sinusitis 09/13/2020   Vitamin D deficiency 09/13/2020   Difficulty with CPAP full face mask use 06/06/2020   Hepatic steatosis  06/06/2020   Blurry vision 06/03/2020   Difficulty walking 06/03/2020   Dizziness 06/03/2020   Headache disorder 06/03/2020   OSA (obstructive sleep apnea) 03/28/2020   Complex renal cyst 03/16/2020   Tubulovillous adenoma of colon 12/14/2019   Blood on toilet paper 11/11/2019   Encounter for follow-up examination after completed treatment for conditions other than malignant neoplasm 11/11/2019   Hematochezia 11/11/2019    Family history of cancer 10/19/2019   Polycythemia 10/19/2019   Facial weakness 02/16/2019   Migraine with aura 02/16/2019    PCP: Silvio Ramp MD  REFERRING PROVIDER: Murray MART DIAG: M79.7 (ICD-10-CM) - Fibromyalgia   Rationale for Evaluation and Treatment: Rehabilitation  THERAPY DIAG:  Muscle weakness (generalized)  Other abnormalities of gait and mobility  Unsteadiness on feet  ONSET DATE:  fibro since 26; new pain in leg and LB march 2025  SUBJECTIVE:                                                                                                                                                                                           SUBJECTIVE STATEMENT: Pt reports she knows how to swim, but hasn't been to pool in over a year.  Pt reports that her son drove her today, but he is waiting in car.  She reports she had to stop 2x to rest after checking in at front desk.  Pt reports she fell when checking in for appt when her buttocks missed the seat of rollator; reports 3 people helped her back up to standing.   From initial evaluation:  Fibromyalgia pain moved into legs and lb hadn't been there before this past March.  Saw neurosurgery but they really didn't find anything.  No MS or spine issues. I am just weak.  Can't open bottles or cans.  Fall often. I've been to PT I n past and it made it worse  PERTINENT HISTORY:  Fibromyalgia falls  PAIN:  Are you having pain? Yes: NPRS scale: current 10/10 Pain location: generalized Pain description: excruciating pain constant Aggravating factors: movement Relieving factors: nothing  PRECAUTIONS: Fall  RED FLAGS: None   WEIGHT BEARING RESTRICTIONS: No  FALLS:  Has patient fallen in last 6 months? Yes. 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  DIAGNOSTIC FINDINGS:  MRI 6/25 cervical spine  FINDINGS: Seven cervical segments are well visualized. Prior cervical fusion at C5-6 and C6-7 are again seen with anterior fixation. No hardware failure is noted. Mild neural foraminal narrowing is noted at C4-5 and C5-6 on the right. The odontoid is within normal limits. Facet hypertrophic changes are noted at multiple levels. No soft tissue abnormality is seen.   IMPRESSION: Degenerative and postsurgical changes in the cervical spine. Neural foraminal narrowing is seen as described.  Thoracic MRI 01/09/24 IMPRESSION: Mild disc disease without significant protrusion, foraminal or spinal stenosis. No acute abnormality.   Lumbar MRI 01/09/24 IMPRESSION: No disc protrusion or evidence of impingement.   L4-5 has mild bilateral degenerative foraminal narrowing.   Mild degenerative disc disease throughout.  PATIENT SURVEYS:  LEFS  Extreme difficulty/unable (0), Quite a bit of difficulty (1), Moderate difficulty (2), Little difficulty (3), No difficulty (4) Survey date:    Any of your usual work, housework or school activities   2. Usual hobbies, recreational or sporting activities   3. Getting into/out of the bath   4. Walking between rooms   5. Putting on socks/shoes   6. Squatting    7. Lifting an object, like a bag of groceries from the floor   8. Performing light activities around your home   9. Performing heavy activities around your home   10. Getting into/out of a car   11. Walking 2 blocks   12. Walking 1 mile   13. Going up/down 10 stairs (1 flight)   14. Standing for 1 hour   15.  sitting for 1 hour   16. Running on even ground   17. Running on uneven  ground   18. Making sharp turns while running fast   19. Hopping    20. Rolling over in bed   Score total:  9/80     COGNITION: Overall cognitive status: Within functional limits for tasks assessed     SENSATION: WFL  MUSCLE LENGTH:   POSTURE: rounded shoulders and flexed trunk   PALPATION: No TTP  LUMBAR ROM:  Unable to stand unsupported    LOWER EXTREMITY ROM:     Bilat knee extension limitations due to pain in knees and le  LOWER EXTREMITY MMT:    MMT Right eval Left eval  Hip flexion 3- 3-  Hip extension    Hip abduction 3+ 3+  Hip adduction 3+ 3+  Hip internal rotation    Hip external rotation    Knee flexion 3- 3-  Knee extension    Ankle dorsiflexion 3- 3-  Ankle plantarflexion    Ankle inversion    Ankle eversion     (Blank rows = not tested)   FUNCTIONAL TESTS:  Tug: 57.19 using rollator STS from bench heavy use of UE Standing unsupported balance x 5s  GAIT: Distance walked: 400 ft using rollator Assistive device utilized: Walker - 4 wheeled Level of assistance: Modified independence Comments: Pt reports pushing through to make distance without rest but would have preferred a rest period  TREATMENT  [facility] Adult PT Treatment:                                             Date:  Pt seen for aquatic therapy today.  Treatment took  place in water 3.5-4.75 ft in depth at the [facility] pool. Temp of water was 91.  Pt entered/exited the pool via chair lift.   - Intro to aquatic therapy principles - seated in lift chair: alternating LAQ with DF; cycling; hip abdct/ add  - UE on barbell: walking forward across pool x 4 widths, with rest break in between each width; cues to widen BOS (scissors) - UE at wall: side stepping L/R 5 ft - UE on wall (in deepest water):  heel raises x 10 ; hip add/abd x 5 each - UE resting on yellow pool noodle, UE on wall gentle cycling - UE on noodle: walking to lift chair  Pt requires the buoyancy and  hydrostatic pressure of water for support, and to offload joints by unweighting joint load by at least 50 % in navel deep water and by at least 75-80% in chest to neck deep water.  Viscosity of the water is needed for resistance of strengthening. Water current perturbations provides challenge to standing balance requiring increased core activation.    Eval Self care:Posture and body mechanic instruction; toleration to activity, safety with use of ad (locking, sitting); fall risk; Adding back bar to rollator; adjusted height of rollator                                                                                                                                PATIENT EDUCATION:  Education details: intro to aquatic therapy; what to expect   Person educated: Patient Education method: Explanation Education comprehension: verbalized understanding  HOME EXERCISE PROGRAM: TBA  ASSESSMENT:  CLINICAL IMPRESSION: Pt demonstrates safety and independence in aquatic setting, although initially unsteady,  with therapist instructing from deck. Pt requires frequent rest breaks due to fatigue and requires UE support on floatation for improved balance.  Pt's gait improved with cues to widen BOS.  Pt reported pain decrease by 1 point, but energy improved while in water.  Session shortened due to pt's late arrival to pool area.   Goals are ongoing.    From initial evaluation:  Patient is a 58 y.o. f who was seen today for physical therapy evaluation and treatment for fibromyalgia.  She presents today using Rolator to setting with reports of extreme fatigue.  She has had an unintentional 90lb weight loss in past 4 months with an onset of extreme pain and weakness in LE and LB.  Hx of fibromyalgia with pain sensitivity primarily in shoulders and cervical spine.  She has had multiple diagnostics completed without any new identified dysfunction/etiology for new symptoms.  She is very weak and unable to tolerate  most objective and functional testing.  Discussed baseline level of activity required for participation of aquatics therapy (500 ft to and from setting, changing, actual aquatic treatment then return home) with concern of ultimate toleration.  She VU of need of cg for transport to and from setting in [place] if using. She  has agreed to trial and to be seen x 1 a week to begin.  She is a good candidate for aquatic intervention and will benefit from the properties of water to progress towards functional goals if tolerated.   OBJECTIVE IMPAIRMENTS: Abnormal gait, decreased activity tolerance, decreased balance, decreased endurance, decreased mobility, difficulty walking, decreased strength, and pain.   ACTIVITY LIMITATIONS: carrying, lifting, bending, standing, squatting, sleeping, stairs, transfers, bed mobility, dressing, locomotion level, and caring for others  PARTICIPATION LIMITATIONS: meal prep, cleaning, laundry, driving, shopping, community activity, occupation, and yard work  PERSONAL FACTORS: see PmhX are also affecting patient's functional outcome.   REHAB POTENTIAL: Fair to good. Uncertain pt will tolerate activity related to aquatic intervention  CLINICAL DECISION MAKING: Evolving/moderate complexity  EVALUATION COMPLEXITY: Moderate   GOALS: Goals reviewed with patient? Yes  SHORT TERM GOALS: Target date: 03/31/24  Pt will tolerate full aquatic sessions consistently without increase in pain and with improving function to demonstrate good toleration and effectiveness of intervention.  Baseline: Goal status: INITIAL    LONG TERM GOALS: Target date: 04/23/24  Pt to improve on LEFS by at least 9 point to demonstrate statistically significant Improvement in function. Baseline: 9/80 Goal status: INITIAL  2.  Pt will improve strength in of LE by 1 grade to demonstrate improved overall physical function Baseline:  Goal status: INITIAL  3.  Pt will tolerate walking to or from setting  and engaging in aquatic therapy session without excessive fatigue or increase in pain to demonstrate improved toleration to activity. Baseline: WC Goal status: INITIAL  4.  Pt will report a reduction of overall pain by 25% Baseline:  Goal status: INITIAL   PLAN:  PT FREQUENCY: 1x/week  PT DURATION:8 weeks 6 visit likely  PLANNED INTERVENTIONS: 97164- PT Re-evaluation, 97750- Physical Performance Testing, 97110-Therapeutic exercises, 97530- Therapeutic activity, [id number]- Neuromuscular re-education, 97535- Self Care, [id number]- Manual therapy, [id number]- Gait training, [phone]- Aquatic Therapy, [phone]- Electrical stimulation (manual), [phone] (1-2 muscles), 20561 (3+ muscles)- Dry Needling, Patient/Family education, Balance training, Stair training, Taping, Joint mobilization, DME instructions, Cryotherapy, and Moist heat.  PLAN FOR NEXT SESSION: Trial aquatic for general strengthening, toleration to activity and pain reduction     For all possible CPT codes, reference the Planned Interventions line above.     Check all conditions that are expected to impact treatment: {Conditions expected to impact treatment:Musculoskeletal disorders, Neurological condition and/or seizures, and Social determinants of health   If treatment provided at initial evaluation, no treatment charged due to lack of authorization.     Delon Aquas, PTA 03/25/24 2:41 PM [facility] [address] [place], [place], [address] Phone: [phone]   Fax:  [phone]

## 2024-03-29 NOTE — Telephone Encounter (Signed)
 I failed to reach patient, please set up a virtual visit with patient to go over result, ok to double book at the end of the day

## 2024-03-30 NOTE — Telephone Encounter (Signed)
 Lvm 1st attempt by hf 03/30/24 needs to schedule video visit spot held for pt

## 2024-03-31 ENCOUNTER — Ambulatory Visit (HOSPITAL_BASED_OUTPATIENT_CLINIC_OR_DEPARTMENT_OTHER): Admitting: Physical Therapy

## 2024-03-31 ENCOUNTER — Encounter (HOSPITAL_BASED_OUTPATIENT_CLINIC_OR_DEPARTMENT_OTHER): Payer: Self-pay | Admitting: Physical Therapy

## 2024-03-31 DIAGNOSIS — M6281 Muscle weakness (generalized): Secondary | ICD-10-CM

## 2024-03-31 DIAGNOSIS — R2681 Unsteadiness on feet: Secondary | ICD-10-CM

## 2024-03-31 DIAGNOSIS — R2689 Other abnormalities of gait and mobility: Secondary | ICD-10-CM

## 2024-03-31 NOTE — Telephone Encounter (Signed)
 Lvm 1st attempt by hf 03/31/24

## 2024-03-31 NOTE — Telephone Encounter (Signed)
 Ok for 4:15pm slot, cancel her NCS on Oct 1st.  Please let her family member come with her if they can

## 2024-03-31 NOTE — Telephone Encounter (Addendum)
 Pt has returned call to CMA, she is asking for a call back

## 2024-03-31 NOTE — Telephone Encounter (Signed)
 Called and spoke to pt who stated that she cannot get her mychart to work. Pt would need to come in person but there is no availibility in the near future. Routing to Dr. Onita to see if she would be willing to accommodate her at 415pm today as that is when the pt stated that she can be here

## 2024-03-31 NOTE — Telephone Encounter (Signed)
 Lvm 1st attempt by hf

## 2024-03-31 NOTE — Therapy (Signed)
 OUTPATIENT PHYSICAL THERAPY THORACOLUMBAR TREATMENT   Patient Name: Anita Michael MRN: [id number] DOB:10-17-1965, 58 y.o., female Today's Date: 03/31/2024  END OF SESSION:  PT End of Session - 03/31/24 1202     Visit Number 3    Date for PT Re-Evaluation 04/23/24    Authorization Type [place] medicaid    Authorization Time Period 02/25/24-04/24/24    Authorization - Visit Number 3    Authorization - Number of Visits 7    PT Start Time 1150    PT Stop Time 1225    PT Time Calculation (min) 35 min    Activity Tolerance Patient limited by fatigue;Patient limited by pain    Behavior During Therapy [facility] for tasks assessed/performed        Authorized 7 visits from 02/25/24-04/24/24  Past Medical History:  Diagnosis Date   CAD (coronary artery disease)    a. s/p NSTEMI in 06/2021 with DES to mid-RCA. Residual disease along D1 and 1st Mrg with medical management recommended.   Essential hypertension    Fibromyalgia    Generalized headaches    History of stroke    Noted incidentally by brain MRI July 2022   Mitral regurgitation    a. moderate to severe by echo in 06/2021   Polycythemia    PVC's (premature ventricular contractions)    Renal insufficiency    Sleep apnea    Stroke [facility])    Past Surgical History:  Procedure Laterality Date   ANKLE SURGERY Right    as a child   ANTERIOR CERVICAL DECOMP/DISCECTOMY FUSION N/A 02/06/2023   Procedure: Cervical Five-Cervical Six, Cervical Six-Cervical Seven. Anterior Cervical Decompression/Discectomy Fusion;  Surgeon: Debby Dorn MATSU, MD;  Location: [facility] OR;  Service: Neurosurgery;  Laterality: N/A;   APPENDECTOMY     BREAST BIOPSY Right 2015   Fibroadenoma   CHOLECYSTECTOMY     CORONARY STENT INTERVENTION N/A 07/06/2021   Procedure: CORONARY STENT INTERVENTION;  Surgeon: Wonda Sharper, MD;  Location: [facility] INVASIVE CV LAB;  Service: Cardiovascular;  Laterality: N/A;   DRUG INDUCED ENDOSCOPY Bilateral 08/06/2022   Procedure: DRUG INDUCED  ENDOSCOPY;  Surgeon: Carlie Clark, MD;  Location: [facility];  Service: ENT;  Laterality: Bilateral;   LEFT HEART CATH AND CORONARY ANGIOGRAPHY N/A 07/06/2021   Procedure: LEFT HEART CATH AND CORONARY ANGIOGRAPHY;  Surgeon: Wonda Sharper, MD;  Location: [facility] INVASIVE CV LAB;  Service: Cardiovascular;  Laterality: N/A;   TONSILLECTOMY     Patient Active Problem List   Diagnosis Date Noted   Weakness 03/01/2024   Muscular dystrophy (HCC) 03/01/2024   Pain 03/01/2024   Capillary disease 02/25/2024   Family history of movement disorder 02/25/2024   Overweight 02/25/2024   Recurrent falls 02/25/2024   Seasonal allergies 02/25/2024   Antiplatelet or antithrombotic long-term use 07/31/2022   BMI 28.0-28.9,adult 07/31/2022   Hemorrhoids 08/21/2021   Constipation 08/21/2021   Herniation of rectum into vagina 08/21/2021   SUI (stress urinary incontinence, female) 08/21/2021   Vaginal pain 08/21/2021   PVC's (premature ventricular contractions) 07/07/2021   Tobacco use 07/06/2021   Mitral valve disorder 07/06/2021   NSTEMI (non-ST elevated myocardial infarction) (HCC) 07/06/2021   Other seasonal allergic rhinitis 05/14/2021   Claustrophobia 09/26/2020   Atrophic vaginitis 09/13/2020   Family history of muscular dystrophy 09/13/2020   Fibromyalgia 09/13/2020   Benign hypertension 09/13/2020   Irregular heart beat 09/13/2020   Mixed hyperlipidemia 09/13/2020   Nephrosclerosis 09/13/2020   Obesity 09/13/2020   Sinusitis 09/13/2020   Vitamin  D deficiency 09/13/2020   Difficulty with CPAP full face mask use 06/06/2020   Hepatic steatosis 06/06/2020   Blurry vision 06/03/2020   Difficulty walking 06/03/2020   Dizziness 06/03/2020   Headache disorder 06/03/2020   OSA (obstructive sleep apnea) 03/28/2020   Complex renal cyst 03/16/2020   Tubulovillous adenoma of colon 12/14/2019   Blood on toilet paper 11/11/2019   Encounter for follow-up examination after completed  treatment for conditions other than malignant neoplasm 11/11/2019   Hematochezia 11/11/2019   Family history of cancer 10/19/2019   Polycythemia 10/19/2019   Facial weakness 02/16/2019   Migraine with aura 02/16/2019    PCP: Silvio Ramp MD  REFERRING PROVIDER: Murray MART DIAG: M79.7 (ICD-10-CM) - Fibromyalgia   Rationale for Evaluation and Treatment: Rehabilitation  THERAPY DIAG:  Muscle weakness (generalized)  Other abnormalities of gait and mobility  Unsteadiness on feet  ONSET DATE:  fibro since 26; new pain in leg and LB march 2025  SUBJECTIVE:                                                                                                                                                                                           SUBJECTIVE STATEMENT: Pt reports she had a little bit of relief for a few hours after last session.    From initial evaluation:  Fibromyalgia pain moved into legs and lb hadn't been there before this past March.  Saw neurosurgery but they really didn't find anything.  No MS or spine issues. I am just weak.  Can't open bottles or cans.  Fall often. I've been to PT I n past and it made it worse  PERTINENT HISTORY:  Fibromyalgia falls  PAIN:  Are you having pain? Yes: NPRS scale: current 10/10 Pain location: generalized Pain description: excruciating pain constant Aggravating factors: movement Relieving factors: nothing  PRECAUTIONS: Fall  RED FLAGS: None   WEIGHT BEARING RESTRICTIONS: No  FALLS:  Has patient fallen in last 6 months? Yes. Number of falls 10 just walking and leg give  LIVING ENVIRONMENT: Lives with: lives with their family Lives in: House/apartment Stairs: No Has following equipment at home: Walker - 4 wheeled cane  OCCUPATION: substitute teacher  ut have  ot been able to work since March  PLOF: Needs assistance with ADLs and Needs assistance with gait  PATIENT GOALS: IDK  NEXT MD VISIT: Aug  18  OBJECTIVE:  Note: Objective measures were completed at Evaluation unless otherwise noted.  DIAGNOSTIC FINDINGS:  MRI 6/25 cervical spine  FINDINGS: Seven cervical segments are well visualized. Prior cervical fusion at C5-6 and C6-7 are again seen with anterior  fixation. No hardware failure is noted. Mild neural foraminal narrowing is noted at C4-5 and C5-6 on the right. The odontoid is within normal limits. Facet hypertrophic changes are noted at multiple levels. No soft tissue abnormality is seen.   IMPRESSION: Degenerative and postsurgical changes in the cervical spine. Neural foraminal narrowing is seen as described.  Thoracic MRI 01/09/24 IMPRESSION: Mild disc disease without significant protrusion, foraminal or spinal stenosis. No acute abnormality.   Lumbar MRI 01/09/24 IMPRESSION: No disc protrusion or evidence of impingement.   L4-5 has mild bilateral degenerative foraminal narrowing.   Mild degenerative disc disease throughout.  PATIENT SURVEYS:  LEFS  Extreme difficulty/unable (0), Quite a bit of difficulty (1), Moderate difficulty (2), Little difficulty (3), No difficulty (4) Survey date:    Any of your usual work, housework or school activities   2. Usual hobbies, recreational or sporting activities   3. Getting into/out of the bath   4. Walking between rooms   5. Putting on socks/shoes   6. Squatting    7. Lifting an object, like a bag of groceries from the floor   8. Performing light activities around your home   9. Performing heavy activities around your home   10. Getting into/out of a car   11. Walking 2 blocks   12. Walking 1 mile   13. Going up/down 10 stairs (1 flight)   14. Standing for 1 hour   15.  sitting for 1 hour   16. Running on even ground   17. Running on uneven ground   18. Making sharp turns while running fast   19. Hopping    20. Rolling over in bed   Score total:  9/80     COGNITION: Overall cognitive status: Within  functional limits for tasks assessed     SENSATION: WFL  MUSCLE LENGTH:   POSTURE: rounded shoulders and flexed trunk   PALPATION: No TTP  LUMBAR ROM:  Unable to stand unsupported    LOWER EXTREMITY ROM:     Bilat knee extension limitations due to pain in knees and le  LOWER EXTREMITY MMT:    MMT Right eval Left eval  Hip flexion 3- 3-  Hip extension    Hip abduction 3+ 3+  Hip adduction 3+ 3+  Hip internal rotation    Hip external rotation    Knee flexion 3- 3-  Knee extension    Ankle dorsiflexion 3- 3-  Ankle plantarflexion    Ankle inversion    Ankle eversion     (Blank rows = not tested)   FUNCTIONAL TESTS:  Tug: 57.19 using rollator STS from bench heavy use of UE Standing unsupported balance x 5s  GAIT: Distance walked: 400 ft using rollator Assistive device utilized: Walker - 4 wheeled Level of assistance: Modified independence Comments: Pt reports pushing through to make distance without rest but would have preferred a rest period  TREATMENT  [facility] Adult PT Treatment:                                             Date: 03/31/24 Pt seen for aquatic therapy today.  Treatment took place in water 3.5-4.75 ft in depth at the [facility] pool. Temp of water was 91.  Pt entered/exited the pool via chair lift.   - seated in lift chair: alternating LAQ with DF; cycling; hip abdct/ add  -  UE on barbell: walking forward across pool x 2 widths (RPE 9/10), with rest break in between each width - UE at wall: side stepping L/R 5 ft x 2  - UE On barbell: forward across width of pool; backwards 1/2 width -> no UE support in 4+ ft  of water walking forward  - UE on wall (in deepest water):  hip add/abdct to toe touch 2x 5 each; heel raises x 5; mini squats x 5 - UE resting on yellow pool noodle behind back, gentle cycling - UE on noodle: walking to lift chair  Pt requires the buoyancy and hydrostatic pressure of water for support, and to offload joints by  unweighting joint load by at least 50 % in navel deep water and by at least 75-80% in chest to neck deep water.  Viscosity of the water is needed for resistance of strengthening. Water current perturbations provides challenge to standing balance requiring increased core activation.    PATIENT EDUCATION:  Education details: intro to aquatic therapy; RPE scale Person educated: Patient Education method: Explanation Education comprehension: verbalized understanding  HOME EXERCISE PROGRAM: TBA  ASSESSMENT:  CLINICAL IMPRESSION: Pt transported to/from session by son via [facility]; utilizes rollator for gait on pool deck. No cues required today to widen BOS with gait.  Pt reporting high RPE with forward gait in water at slow speed; reduced RPE with standing rest breaks. Frequent rest breaks given to recover from exertion.   Pt reported no reduction in pain during session, but pt able to tolerate 10 more minutes of movement in pool than last session and was able to tolerate gait in water without UE support.  Goals are ongoing.    From initial evaluation:  Patient is a 58 y.o. f who was seen today for physical therapy evaluation and treatment for fibromyalgia.  She presents today using Rolator to setting with reports of extreme fatigue.  She has had an unintentional 90lb weight loss in past 4 months with an onset of extreme pain and weakness in LE and LB.  Hx of fibromyalgia with pain sensitivity primarily in shoulders and cervical spine.  She has had multiple diagnostics completed without any new identified dysfunction/etiology for new symptoms.  She is very weak and unable to tolerate most objective and functional testing.  Discussed baseline level of activity required for participation of aquatics therapy (500 ft to and from setting, changing, actual aquatic treatment then return home) with concern of ultimate toleration.  She VU of need of cg for transport to and from setting in [place] if using. She has agreed to  trial and to be seen x 1 a week to begin.  She is a good candidate for aquatic intervention and will benefit from the properties of water to progress towards functional goals if tolerated.   OBJECTIVE IMPAIRMENTS: Abnormal gait, decreased activity tolerance, decreased balance, decreased endurance, decreased mobility, difficulty walking, decreased strength, and pain.   ACTIVITY LIMITATIONS: carrying, lifting, bending, standing, squatting, sleeping, stairs, transfers, bed mobility, dressing, locomotion level, and caring for others  PARTICIPATION LIMITATIONS: meal prep, cleaning, laundry, driving, shopping, community activity, occupation, and yard work  PERSONAL FACTORS: see [facility] are also affecting patient's functional outcome.   REHAB POTENTIAL: Fair to good. Uncertain pt will tolerate activity related to aquatic intervention  CLINICAL DECISION MAKING: Evolving/moderate complexity  EVALUATION COMPLEXITY: Moderate   GOALS: Goals reviewed with patient? Yes  SHORT TERM GOALS: Target date: 03/31/24  Pt will tolerate full aquatic sessions consistently without increase in  pain and with improving function to demonstrate good toleration and effectiveness of intervention.  Baseline: Goal status: in progress - 03/31/24    LONG TERM GOALS: Target date: 04/23/24  Pt to improve on LEFS by at least 9 point to demonstrate statistically significant Improvement in function. Baseline: 9/80 Goal status: INITIAL  2.  Pt will improve strength in of LE by 1 grade to demonstrate improved overall physical function Baseline:  Goal status: INITIAL  3.  Pt will tolerate walking to or from setting and engaging in aquatic therapy session without excessive fatigue or increase in pain to demonstrate improved toleration to activity. Baseline: WC Goal status: INITIAL  4.  Pt will report a reduction of overall pain by 25% Baseline:  Goal status: INITIAL   PLAN:  PT FREQUENCY: 1x/week  PT DURATION:8  weeks 6 visit likely  PLANNED INTERVENTIONS: 97164- PT Re-evaluation, 97750- Physical Performance Testing, 97110-Therapeutic exercises, 97530- Therapeutic activity, [id number]- Neuromuscular re-education, 97535- Self Care, [id number]- Manual therapy, [id number]- Gait training, [phone]- Aquatic Therapy, [phone]- Electrical stimulation (manual), [phone] (1-2 muscles), 20561 (3+ muscles)- Dry Needling, Patient/Family education, Balance training, Stair training, Taping, Joint mobilization, DME instructions, Cryotherapy, and Moist heat.  PLAN FOR NEXT SESSION: Trial aquatic for general strengthening, toleration to activity and pain reduction   Delon Aquas, PTA 03/31/24 12:34 PM [facility] [address] [place], [place], [address] Phone: [phone]   Fax:  [phone]   For all possible CPT codes, reference the Planned Interventions line above.     Check all conditions that are expected to impact treatment: {Conditions expected to impact treatment:Musculoskeletal disorders, Neurological condition and/or seizures, and Social determinants of health   If treatment provided at initial evaluation, no treatment charged due to lack of authorization.

## 2024-04-01 NOTE — Telephone Encounter (Signed)
 Lvm 1st attempt by hf 04/01/24

## 2024-04-01 NOTE — Telephone Encounter (Signed)
 Pt called in and appt was scheduled

## 2024-04-02 ENCOUNTER — Encounter: Payer: Self-pay | Admitting: Neurology

## 2024-04-02 ENCOUNTER — Ambulatory Visit (INDEPENDENT_AMBULATORY_CARE_PROVIDER_SITE_OTHER): Admitting: Neurology

## 2024-04-02 VITALS — BP 110/72 | HR 80 | Ht 67.5 in | Wt 155.5 lb

## 2024-04-02 DIAGNOSIS — R531 Weakness: Secondary | ICD-10-CM

## 2024-04-02 DIAGNOSIS — R1310 Dysphagia, unspecified: Secondary | ICD-10-CM | POA: Diagnosis not present

## 2024-04-02 DIAGNOSIS — G7111 Myotonic muscular dystrophy: Secondary | ICD-10-CM | POA: Diagnosis not present

## 2024-04-02 NOTE — Progress Notes (Signed)
 Chief Complaint  Patient presents with   Follow-up    RM 14, Pt alone, here for muscular dystrophy f/u.      ASSESSMENT AND PLAN  Anita Michael is a 58 y.o. 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  Strong family history  She has bilateral temporal wasting, high arch hard palate, moderate bulbar weakness, mild to moderate proximal upper and lower extremity muscle weakness, profound distal finger flexion weakness, mild thenar myotonic activity with palpitation  Continue Cymbalta  60 mg daily for pain  She is planning on applying for disability  Will continue follow-up with her cardiologist Dr. Debera    DIAGNOSTIC DATA (LABS, IMAGING, TESTING) - I reviewed patient records, labs, notes, testing and imaging myself where available.   MEDICAL HISTORY:  Anita Michael, is a 58 year old female, came in with walker, driven by her son, alone at today's clinical visit, seen in request by her primary care nurse practitioner Bucio, Elsa for evaluation of body achy pain, gait abnormality, frequent falling, initial evaluation was March 01, 2024   History is obtained from the patient and review of electronic medical records. I personally reviewed pertinent available imaging films in PACS.   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  She was found to have moderate bulbar, proximal upper and lower extremity muscle weakness, profound distal finger flexion weakness, also reported difficulty release of tight grip sometimes, suggestive of myotonic muscular dystrophy,  She did report strong family history  of muscular dystrophy, she is a mother of 86 children, age from 41-44, 2 of her sons at age 55 and 50 carried a diagnosis of myotonic muscular dystrophy, no genetic confirmation per patient, they both have significant gait abnormality,  She has 5 siblings, 2 of her sisters has myotonic muscular dystrophy. Her father has muscular dystrophy, died at age 32 due to cardiac muscle involvement,  She had a history of cervical decompression surgery in July 2024: Presurgical MRI of cervical spine November 13, 2021 showed degenerative disc osteophyte at C5-6, with severe spinal stenosis, severe right, moderate left C6 foraminal narrowing, patchy signal abnormality within the cervical cord  MRI of the brain in 2025, no acute intracranial abnormality.  MRI of cervical spine anterior cervical fusion discectomy from C5-7, mild atrophy at C6 with mild increased signal abnormality likely reflect chronic myelomalacia, AP diameter at C6-7 7 mm, with effacement of ventral thecal sac with moderate canal stenosis,  MRI of thoracic spine essentially normal,  MRI of lumbar spine, mild degenerative changes, no evidence of nerve roots or cord compression.  Laboratory evaluation in 2025, CPK was 22, hemoglobin of 14.9, TSH was normal, BMP was essentially normal with exception of mildly elevated glucose 136  UPDATE Sept 12 2025: She is accompanied by her son [name] at today's clinical visit, we reviewed positive genetic testing from [facility], autosomal dominant DMPK CTG repeating member 28, consistent with diagnosis of myotonic muscular dystrophy type I, also gave her a copy of the result  She lives with her grandson at home, no longer driving, quit driving since 7775, worry about leg weakness, work part-time job as a Lawyer,  She complains slow Worsening swallowing difficulty, with about 80 pound weight loss over past 6 months, is seeing cardiologist Dr. Debera for history of coronary artery disease, father who  also suffered myotonic muscular dystrophy, died of cardiomyopathy at early age,  PHYSICAL EXAM:   Vitals:   04/02/24 1059  BP: 110/72  Pulse: 80  Weight: 155 lb 8 oz (70.5 kg)  Height: 5' 7.5 (1.715 m)   Body mass index is 24 kg/m.  PHYSICAL EXAMNIATION:  Gen: NAD, conversant, well nourised, well groomed                     Cardiovascular: Regular rate rhythm, no peripheral edema, warm, nontender. Eyes: Conjunctivae clear without exudates or hemorrhage Neck: Supple, no carotid bruits. Pulmonary: Clear to auscultation bilaterally   NEUROLOGICAL EXAM:  MENTAL STATUS: Speech/cognition: Awake, alert, oriented to history taking and casual conversation CRANIAL NERVES: CN II: Visual fields are full to confrontation. Pupils are round equal and briskly reactive to light. CN III, IV, VI: extraocular movement are normal. No ptosis. CN V: Facial sensation is intact to light touch CN VII: Mild bilateral temporal wasting, high arch hard palate, moderate eye closure, cheek puff weakness, mild CN VIII: Hearing is normal to causal conversation. CN IX, X: Phonation is normal. CN XI: Head turning and shoulder shrug are intact CN XII: High arched soft palate,  MOTOR: Mild neck flexion weakness, mild proximal upper and lower extremity muscle weakness, profound bilateral distal finger flexor weakness  REFLEXES: Reflexes are 1 and symmetric at the biceps, triceps, 1/1 knees, and absent at ankles. Plantar responses are flexor.  SENSORY: Intact to light touch, pinprick and vibratory sensation are intact in fingers and toes.  COORDINATION: There is no trunk or limb dysmetria noted.  GAIT/STANCE: Need push-up to get up from seated position, unsteady rely on her walker  REVIEW OF SYSTEMS:  Full 14 system review of systems performed and notable only for as above All other review of systems were negative.   ALLERGIES: Allergies  Allergen Reactions   Nifedipine Other (See Comments) and  Palpitations    Heart races   Latex Rash   Neurontin  [Gabapentin ] Swelling and Rash    HOME MEDICATIONS: Current Outpatient Medications  Medication Sig Dispense Refill   aspirin  EC 81 MG EC tablet Take 1 tablet (81 mg total) by mouth daily. Swallow whole. 30 tablet 11   atorvastatin  (LIPITOR ) 80 MG tablet TAKE 1 TABLET BY MOUTH DAILY AT 6PM. 90 tablet 3   cetirizine (ZYRTEC) 10 MG tablet Take 10 mg by mouth daily as needed for allergies.     cholecalciferol (VITAMIN D3) 25 MCG (1000 UT) tablet Take 1,000 Units by mouth daily in the afternoon.     DULoxetine  (CYMBALTA ) 60 MG capsule Take 1 capsule (60 mg total) by mouth daily. 30 capsule 11   ezetimibe  (ZETIA ) 10 MG tablet Take 10 mg by mouth daily.     fluticasone  (FLONASE ) 50 MCG/ACT nasal spray Place 2 sprays into both nostrils daily. 16 g 0   isosorbide  mononitrate (IMDUR ) 30 MG 24 hr tablet Take 1 tablet (30 mg total) by mouth daily. 90 tablet 2   losartan  (COZAAR ) 100 MG tablet Take 1 tablet (100 mg total) by mouth daily. 90 tablet 2   nitroGLYCERIN  (NITROSTAT ) 0.4 MG SL tablet Place 1 tablet (0.4 mg total) under the tongue every 5 (five) minutes x 3 doses as needed for chest pain. 25 tablet 2   nortriptyline  (PAMELOR ) 10 MG  capsule Take 2 capsules (20 mg total) by mouth at bedtime. 60 capsule 11   nystatin (MYCOSTATIN/NYSTOP) powder 1 application. 2 (two) times daily as needed (skin irritation).     potassium chloride  SA (KLOR-CON  M) 20 MEQ tablet Take 2 tablets (40 mEq total) by mouth daily. 180 tablet 3   traMADol  (ULTRAM ) 50 MG tablet Take 1 tablet (50 mg total) by mouth every 8 (eight) hours as needed. 90 tablet 1   No current facility-administered medications for this visit.    PAST MEDICAL HISTORY: Past Medical History:  Diagnosis Date   CAD (coronary artery disease)    a. s/p NSTEMI in 06/2021 with DES to mid-RCA. Residual disease along D1 and 1st Mrg with medical management recommended.   Essential hypertension     Fibromyalgia    Generalized headaches    History of stroke    Noted incidentally by brain MRI July 2022   Mitral regurgitation    a. moderate to severe by echo in 06/2021   Polycythemia    PVC's (premature ventricular contractions)    Renal insufficiency    Sleep apnea    Stroke [facility])     PAST SURGICAL HISTORY: Past Surgical History:  Procedure Laterality Date   ANKLE SURGERY Right    as a child   ANTERIOR CERVICAL DECOMP/DISCECTOMY FUSION N/A 02/06/2023   Procedure: Cervical Five-Cervical Six, Cervical Six-Cervical Seven. Anterior Cervical Decompression/Discectomy Fusion;  Surgeon: Debby Dorn MATSU, MD;  Location: [facility] OR;  Service: Neurosurgery;  Laterality: N/A;   APPENDECTOMY     BREAST BIOPSY Right 2015   Fibroadenoma   CHOLECYSTECTOMY     CORONARY STENT INTERVENTION N/A 07/06/2021   Procedure: CORONARY STENT INTERVENTION;  Surgeon: Wonda Sharper, MD;  Location: [facility] INVASIVE CV LAB;  Service: Cardiovascular;  Laterality: N/A;   DRUG INDUCED ENDOSCOPY Bilateral 08/06/2022   Procedure: DRUG INDUCED ENDOSCOPY;  Surgeon: Carlie Clark, MD;  Location: [facility];  Service: ENT;  Laterality: Bilateral;   LEFT HEART CATH AND CORONARY ANGIOGRAPHY N/A 07/06/2021   Procedure: LEFT HEART CATH AND CORONARY ANGIOGRAPHY;  Surgeon: Wonda Sharper, MD;  Location: [facility] INVASIVE CV LAB;  Service: Cardiovascular;  Laterality: N/A;   TONSILLECTOMY      FAMILY HISTORY: Family History  Problem Relation Age of Onset   Breast cancer Mother 36   Breast cancer Sister 65   Breast cancer Maternal Grandmother 5   Breast cancer Sister 33    SOCIAL HISTORY: Social History   Socioeconomic History   Marital status: Widowed    Spouse name: Not on file   Number of children: 6   Years of education: Not on file   Highest education level: Not on file  Occupational History   Not on file  Tobacco Use   Smoking status: Every Day    Current packs/day: 0.25    Average packs/day: 0.3  packs/day for 1.7 years (0.4 ttl pk-yrs)    Types: Cigarettes    Start date: 2024    Last attempt to quit: 02/07/2019   Smokeless tobacco: Never   Tobacco comments:    1 cigarette a day   Vaping Use   Vaping status: Never Used  Substance and Sexual Activity   Alcohol use: Yes    Comment: 1x a year   Drug use: No   Sexual activity: Not Currently    Birth control/protection: Post-menopausal  Other Topics Concern   Not on file  Social History Narrative   Right handed   Caffeine- 1  cup daily   Works as Lawyer   Lives with grandson      Social Drivers of Health   Financial Resource Strain: Medium Risk (07/27/2021)   Overall Financial Resource Strain (CARDIA)    Difficulty of Paying Living Expenses: Somewhat hard  Food Insecurity: No Food Insecurity (07/27/2021)   Hunger Vital Sign    Worried About Running Out of Food in the Last Year: Never true    Ran Out of Food in the Last Year: Never true  Transportation Needs: No Transportation Needs (07/27/2021)   PRAPARE - Administrator, Civil Service (Medical): No    Lack of Transportation (Non-Medical): No  Physical Activity: Inactive (07/27/2021)   Exercise Vital Sign    Days of Exercise per Week: 0 days    Minutes of Exercise per Session: 0 min  Stress: No Stress Concern Present (07/27/2021)   [employer] of Occupational Health - Occupational Stress Questionnaire    Feeling of Stress : Not at all  Social Connections: Moderately Isolated (07/27/2021)   Social Connection and Isolation Panel    Frequency of Communication with Friends and Family: Twice a week    Frequency of Social Gatherings with Friends and Family: Once a week    Attends Religious Services: 1 to 4 times per year    Active Member of [employer] or Organizations: No    Attends Banker Meetings: Never    Marital Status: Widowed  Intimate Partner Violence: Not At Risk (07/27/2021)   Humiliation, Afraid, Rape, and Kick questionnaire    Fear  of Current or Ex-Partner: No    Emotionally Abused: No    Physically Abused: No    Sexually Abused: No      Modena Callander, M.D. Ph.D.  [facility] [address] [place], [place] [address] Ph: [phone] Fax: [phone]  CC:  Alston Silvio BROCKS, FNP [address] [place],  [place] [address]  Bucio, Elsa C, FNP    I personally spent a total of 45 minutes in the care of the patient today including preparing to see the patient, getting/reviewing separately obtained history, performing a medically appropriate exam/evaluation, counseling and educating, placing orders, referring and communicating with other health care professionals, documenting clinical information in the EHR, independently interpreting results, and communicating results to.

## 2024-04-08 ENCOUNTER — Ambulatory Visit (HOSPITAL_BASED_OUTPATIENT_CLINIC_OR_DEPARTMENT_OTHER): Admitting: Physical Therapy

## 2024-04-09 ENCOUNTER — Encounter (HOSPITAL_BASED_OUTPATIENT_CLINIC_OR_DEPARTMENT_OTHER): Payer: Self-pay | Admitting: Physical Therapy

## 2024-04-09 ENCOUNTER — Ambulatory Visit (HOSPITAL_BASED_OUTPATIENT_CLINIC_OR_DEPARTMENT_OTHER): Admitting: Physical Therapy

## 2024-04-09 DIAGNOSIS — M6281 Muscle weakness (generalized): Secondary | ICD-10-CM

## 2024-04-09 DIAGNOSIS — R2689 Other abnormalities of gait and mobility: Secondary | ICD-10-CM

## 2024-04-09 DIAGNOSIS — R2681 Unsteadiness on feet: Secondary | ICD-10-CM

## 2024-04-09 NOTE — Therapy (Signed)
 OUTPATIENT PHYSICAL THERAPY THORACOLUMBAR TREATMENT   Patient Name: Anita Michael MRN: [id number] DOB:1966-03-28, 58 y.o., female Today's Date: 04/09/2024  END OF SESSION:  PT End of Session - 04/09/24 1255     Visit Number 4    Date for Recertification  04/23/24    Authorization Type [place] medicaid    Authorization Time Period 02/25/24-04/24/24    Authorization - Visit Number 4    Authorization - Number of Visits 7    PT Start Time 1245    PT Stop Time 1323    PT Time Calculation (min) 38 min    Activity Tolerance Patient limited by fatigue;Patient limited by pain    Behavior During Therapy [facility] for tasks assessed/performed         Authorized 7 visits from 02/25/24-04/24/24  Past Medical History:  Diagnosis Date   CAD (coronary artery disease)    a. s/p NSTEMI in 06/2021 with DES to mid-RCA. Residual disease along D1 and 1st Mrg with medical management recommended.   Essential hypertension    Fibromyalgia    Generalized headaches    History of stroke    Noted incidentally by brain MRI July 2022   Mitral regurgitation    a. moderate to severe by echo in 06/2021   Polycythemia    PVC's (premature ventricular contractions)    Renal insufficiency    Sleep apnea    Stroke [facility])    Past Surgical History:  Procedure Laterality Date   ANKLE SURGERY Right    as a child   ANTERIOR CERVICAL DECOMP/DISCECTOMY FUSION N/A 02/06/2023   Procedure: Cervical Five-Cervical Six, Cervical Six-Cervical Seven. Anterior Cervical Decompression/Discectomy Fusion;  Surgeon: Debby Dorn MATSU, MD;  Location: [facility] OR;  Service: Neurosurgery;  Laterality: N/A;   APPENDECTOMY     BREAST BIOPSY Right 2015   Fibroadenoma   CHOLECYSTECTOMY     CORONARY STENT INTERVENTION N/A 07/06/2021   Procedure: CORONARY STENT INTERVENTION;  Surgeon: Wonda Sharper, MD;  Location: [facility] INVASIVE CV LAB;  Service: Cardiovascular;  Laterality: N/A;   DRUG INDUCED ENDOSCOPY Bilateral 08/06/2022   Procedure: DRUG  INDUCED ENDOSCOPY;  Surgeon: Carlie Clark, MD;  Location: [facility];  Service: ENT;  Laterality: Bilateral;   LEFT HEART CATH AND CORONARY ANGIOGRAPHY N/A 07/06/2021   Procedure: LEFT HEART CATH AND CORONARY ANGIOGRAPHY;  Surgeon: Wonda Sharper, MD;  Location: [facility] INVASIVE CV LAB;  Service: Cardiovascular;  Laterality: N/A;   TONSILLECTOMY     Patient Active Problem List   Diagnosis Date Noted   Myotonic muscular dystrophy (HCC) 04/02/2024   Dysphagia 04/02/2024   Weakness 03/01/2024   Muscular dystrophy (HCC) 03/01/2024   Pain 03/01/2024   Capillary disease 02/25/2024   Family history of movement disorder 02/25/2024   Overweight 02/25/2024   Recurrent falls 02/25/2024   Seasonal allergies 02/25/2024   Antiplatelet or antithrombotic long-term use 07/31/2022   BMI 28.0-28.9,adult 07/31/2022   Hemorrhoids 08/21/2021   Constipation 08/21/2021   Herniation of rectum into vagina 08/21/2021   SUI (stress urinary incontinence, female) 08/21/2021   Vaginal pain 08/21/2021   PVC's (premature ventricular contractions) 07/07/2021   Tobacco use 07/06/2021   Mitral valve disorder 07/06/2021   NSTEMI (non-ST elevated myocardial infarction) (HCC) 07/06/2021   Other seasonal allergic rhinitis 05/14/2021   Claustrophobia 09/26/2020   Atrophic vaginitis 09/13/2020   Family history of muscular dystrophy 09/13/2020   Fibromyalgia 09/13/2020   Benign hypertension 09/13/2020   Irregular heart beat 09/13/2020   Mixed hyperlipidemia 09/13/2020   Nephrosclerosis  09/13/2020   Obesity 09/13/2020   Sinusitis 09/13/2020   Vitamin D deficiency 09/13/2020   Difficulty with CPAP full face mask use 06/06/2020   Hepatic steatosis 06/06/2020   Blurry vision 06/03/2020   Difficulty walking 06/03/2020   Dizziness 06/03/2020   Headache disorder 06/03/2020   OSA (obstructive sleep apnea) 03/28/2020   Complex renal cyst 03/16/2020   Tubulovillous adenoma of colon 12/14/2019   Blood on  toilet paper 11/11/2019   Encounter for follow-up examination after completed treatment for conditions other than malignant neoplasm 11/11/2019   Hematochezia 11/11/2019   Family history of cancer 10/19/2019   Polycythemia 10/19/2019   Facial weakness 02/16/2019   Migraine with aura 02/16/2019    PCP: Silvio Ramp MD  REFERRING PROVIDER: Murray MART DIAG: M79.7 (ICD-10-CM) - Fibromyalgia   Rationale for Evaluation and Treatment: Rehabilitation  THERAPY DIAG:  Muscle weakness (generalized)  Other abnormalities of gait and mobility  Unsteadiness on feet  ONSET DATE:  fibro since 26; new pain in leg and LB march 2025  SUBJECTIVE:                                                                                                                                                                                           SUBJECTIVE STATEMENT: Pt reports fatigue 8/10; generalized pain 10/10 .  I feel good for about a hour after last session then it came right back  From initial evaluation:  Fibromyalgia pain moved into legs and lb hadn't been there before this past March.  Saw neurosurgery but they really didn't find anything.  No MS or spine issues. I am just weak.  Can't open bottles or cans.  Fall often. I've been to PT I n past and it made it worse  PERTINENT HISTORY:  Fibromyalgia falls  PAIN:  Are you having pain? Yes: NPRS scale: current 10/10 Pain location: generalized Pain description: excruciating pain constant Aggravating factors: movement Relieving factors: nothing  PRECAUTIONS: Fall  RED FLAGS: None   WEIGHT BEARING RESTRICTIONS: No  FALLS:  Has patient fallen in last 6 months? Yes. Number of falls 10 just walking and leg give  LIVING ENVIRONMENT: Lives with: lives with their family Lives in: House/apartment Stairs: No Has following equipment at home: Walker - 4 wheeled cane  OCCUPATION: substitute teacher  ut have  ot been able to work since  March  PLOF: Needs assistance with ADLs and Needs assistance with gait  PATIENT GOALS: IDK  NEXT MD VISIT: Aug 18  OBJECTIVE:  Note: Objective measures were completed at Evaluation unless otherwise noted.  DIAGNOSTIC FINDINGS:  MRI 6/25 cervical spine  FINDINGS:  Seven cervical segments are well visualized. Prior cervical fusion at C5-6 and C6-7 are again seen with anterior fixation. No hardware failure is noted. Mild neural foraminal narrowing is noted at C4-5 and C5-6 on the right. The odontoid is within normal limits. Facet hypertrophic changes are noted at multiple levels. No soft tissue abnormality is seen.   IMPRESSION: Degenerative and postsurgical changes in the cervical spine. Neural foraminal narrowing is seen as described.  Thoracic MRI 01/09/24 IMPRESSION: Mild disc disease without significant protrusion, foraminal or spinal stenosis. No acute abnormality.   Lumbar MRI 01/09/24 IMPRESSION: No disc protrusion or evidence of impingement.   L4-5 has mild bilateral degenerative foraminal narrowing.   Mild degenerative disc disease throughout.  PATIENT SURVEYS:  LEFS  Extreme difficulty/unable (0), Quite a bit of difficulty (1), Moderate difficulty (2), Little difficulty (3), No difficulty (4) Survey date:    Any of your usual work, housework or school activities   2. Usual hobbies, recreational or sporting activities   3. Getting into/out of the bath   4. Walking between rooms   5. Putting on socks/shoes   6. Squatting    7. Lifting an object, like a bag of groceries from the floor   8. Performing light activities around your home   9. Performing heavy activities around your home   10. Getting into/out of a car   11. Walking 2 blocks   12. Walking 1 mile   13. Going up/down 10 stairs (1 flight)   14. Standing for 1 hour   15.  sitting for 1 hour   16. Running on even ground   17. Running on uneven ground   18. Making sharp turns while running fast    19. Hopping    20. Rolling over in bed   Score total:  9/80     COGNITION: Overall cognitive status: Within functional limits for tasks assessed     SENSATION: WFL  MUSCLE LENGTH:   POSTURE: rounded shoulders and flexed trunk   PALPATION: No TTP  LUMBAR ROM:  Unable to stand unsupported    LOWER EXTREMITY ROM:     Bilat knee extension limitations due to pain in knees and le  LOWER EXTREMITY MMT:    MMT Right eval Left eval  Hip flexion 3- 3-  Hip extension    Hip abduction 3+ 3+  Hip adduction 3+ 3+  Hip internal rotation    Hip external rotation    Knee flexion 3- 3-  Knee extension    Ankle dorsiflexion 3- 3-  Ankle plantarflexion    Ankle inversion    Ankle eversion     (Blank rows = not tested)   FUNCTIONAL TESTS:  Tug: 57.19 using rollator STS from bench heavy use of UE Standing unsupported balance x 5s  GAIT: Distance walked: 400 ft using rollator Assistive device utilized: Walker - 4 wheeled Level of assistance: Modified independence Comments: Pt reports pushing through to make distance without rest but would have preferred a rest period  TREATMENT  [facility] Adult PT Treatment:                                             Date: 04/09/24 Pt seen for aquatic therapy today.  Treatment took place in water 3.5-4.75 ft in depth at the [facility] pool. Temp of water was 91.  Pt entered/exited the pool  via chair lift.   - seated in lift chair: alternating LAQ with DF; cycling; hip abdct/ add  - UE on barbell: walking forward across pool x 2 widths (RPE 9/10), with rest break in between each width - UE at wall: side stepping L/R 5 ft x 2  - UE On barbell: forward across width of pool; backwards 1/2 width -> no UE support in 4+ ft  of water walking forward  - UE on wall :  hip add/abdct to toe touch x 5 each; heel raises x 5; toe raises; mini squats x 5 - UE on barbell: walking to lift chair  Pt requires the buoyancy and hydrostatic pressure  of water for support, and to offload joints by unweighting joint load by at least 50 % in navel deep water and by at least 75-80% in chest to neck deep water.  Viscosity of the water is needed for resistance of strengthening. Water current perturbations provides challenge to standing balance requiring increased core activation.    PATIENT EDUCATION:  Education details: intro to aquatic therapy; RPE scale Person educated: Patient Education method: Explanation Education comprehension: verbalized understanding  HOME EXERCISE PROGRAM: TBA  ASSESSMENT:  CLINICAL IMPRESSION: Pt transported to/from session by son via [facility] she is late for appt and exhausted at baseline. She utilizes rollator for gait on pool deck with good posture and gait pattern. She continues to require multiple resting periods to recover from high exertional level. No reduction in pain or fatigue by end but no increase either.  Good toleration.  Goals ongoing.     From initial evaluation:  Patient is a 58 y.o. f who was seen today for physical therapy evaluation and treatment for fibromyalgia.  She presents today using Rolator to setting with reports of extreme fatigue.  She has had an unintentional 90lb weight loss in past 4 months with an onset of extreme pain and weakness in LE and LB.  Hx of fibromyalgia with pain sensitivity primarily in shoulders and cervical spine.  She has had multiple diagnostics completed without any new identified dysfunction/etiology for new symptoms.  She is very weak and unable to tolerate most objective and functional testing.  Discussed baseline level of activity required for participation of aquatics therapy (500 ft to and from setting, changing, actual aquatic treatment then return home) with concern of ultimate toleration.  She VU of need of cg for transport to and from setting in [place] if using. She has agreed to trial and to be seen x 1 a week to begin.  She is a good candidate for aquatic  intervention and will benefit from the properties of water to progress towards functional goals if tolerated.   OBJECTIVE IMPAIRMENTS: Abnormal gait, decreased activity tolerance, decreased balance, decreased endurance, decreased mobility, difficulty walking, decreased strength, and pain.   ACTIVITY LIMITATIONS: carrying, lifting, bending, standing, squatting, sleeping, stairs, transfers, bed mobility, dressing, locomotion level, and caring for others  PARTICIPATION LIMITATIONS: meal prep, cleaning, laundry, driving, shopping, community activity, occupation, and yard work  PERSONAL FACTORS: see [facility] are also affecting patient's functional outcome.   REHAB POTENTIAL: Fair to good. Uncertain pt will tolerate activity related to aquatic intervention  CLINICAL DECISION MAKING: Evolving/moderate complexity  EVALUATION COMPLEXITY: Moderate   GOALS: Goals reviewed with patient? Yes  SHORT TERM GOALS: Target date: 03/31/24  Pt will tolerate full aquatic sessions consistently without increase in pain and with improving function to demonstrate good toleration and effectiveness of intervention.  Baseline: Goal status: in progress -  03/31/24    LONG TERM GOALS: Target date: 04/23/24  Pt to improve on LEFS by at least 9 point to demonstrate statistically significant Improvement in function. Baseline: 9/80 Goal status: INITIAL  2.  Pt will improve strength in of LE by 1 grade to demonstrate improved overall physical function Baseline:  Goal status: INITIAL  3.  Pt will tolerate walking to or from setting and engaging in aquatic therapy session without excessive fatigue or increase in pain to demonstrate improved toleration to activity. Baseline: WC Goal status: INITIAL  4.  Pt will report a reduction of overall pain by 25% Baseline:  Goal status: INITIAL   PLAN:  PT FREQUENCY: 1x/week  PT DURATION:8 weeks 6 visit likely  PLANNED INTERVENTIONS: 97164- PT Re-evaluation, 97750-  Physical Performance Testing, 97110-Therapeutic exercises, 97530- Therapeutic activity, [id number]- Neuromuscular re-education, 97535- Self Care, [id number]- Manual therapy, [id number]- Gait training, [phone]- Aquatic Therapy, [phone]- Electrical stimulation (manual), [phone] (1-2 muscles), 20561 (3+ muscles)- Dry Needling, Patient/Family education, Balance training, Stair training, Taping, Joint mobilization, DME instructions, Cryotherapy, and Moist heat.  PLAN FOR NEXT SESSION: Trial aquatic for general strengthening, toleration to activity and pain reduction   Jemmie Ledgerwood) Tresha Muzio MPT 04/09/24 1:28 PM [facility] [address] [place], [place], [address] Phone: [phone]   Fax:  [phone]    For all possible CPT codes, reference the Planned Interventions line above.     Check all conditions that are expected to impact treatment: {Conditions expected to impact treatment:Musculoskeletal disorders, Neurological condition and/or seizures, and Social determinants of health   If treatment provided at initial evaluation, no treatment charged due to lack of authorization.

## 2024-04-12 ENCOUNTER — Other Ambulatory Visit: Payer: Self-pay

## 2024-04-12 MED ORDER — DULOXETINE HCL 60 MG PO CPEP: 60.0000 mg | ORAL_CAPSULE | Freq: Every day | ORAL | 3 refills | Status: DC

## 2024-04-14 ENCOUNTER — Ambulatory Visit (HOSPITAL_BASED_OUTPATIENT_CLINIC_OR_DEPARTMENT_OTHER): Admitting: Physical Therapy

## 2024-04-21 ENCOUNTER — Encounter (HOSPITAL_BASED_OUTPATIENT_CLINIC_OR_DEPARTMENT_OTHER): Payer: Self-pay | Admitting: Physical Therapy

## 2024-04-21 ENCOUNTER — Ambulatory Visit (HOSPITAL_BASED_OUTPATIENT_CLINIC_OR_DEPARTMENT_OTHER): Attending: Physical Medicine & Rehabilitation | Admitting: Physical Therapy

## 2024-04-21 ENCOUNTER — Encounter: Admitting: Neurology

## 2024-04-21 DIAGNOSIS — R2689 Other abnormalities of gait and mobility: Secondary | ICD-10-CM | POA: Insufficient documentation

## 2024-04-21 DIAGNOSIS — M6281 Muscle weakness (generalized): Secondary | ICD-10-CM | POA: Insufficient documentation

## 2024-04-21 DIAGNOSIS — R2681 Unsteadiness on feet: Secondary | ICD-10-CM | POA: Insufficient documentation

## 2024-04-21 NOTE — Therapy (Signed)
 OUTPATIENT PHYSICAL THERAPY THORACOLUMBAR TREATMENT Progress Note Reporting Period 02/26/24 to 04/21/24  See note below for Objective Data and Assessment of Progress/Goals.      Patient Name: Anita Michael MRN: [id number] DOB:Sep 16, 1965, 58 y.o., female Today's Date: 04/21/2024  END OF SESSION:  PT End of Session - 04/21/24 1200     Visit Number 5    Date for Recertification  06/04/24    Authorization Type [place] medicaid    Authorization Time Period 02/25/24-04/24/24    Authorization - Visit Number 5    Authorization - Number of Visits 7    PT Start Time 1152    PT Stop Time 1230    PT Time Calculation (min) 38 min    Activity Tolerance Patient limited by fatigue;Patient limited by pain    Behavior During Therapy [facility] for tasks assessed/performed         Authorized 7 visits from 02/25/24-04/24/24  Past Medical History:  Diagnosis Date   CAD (coronary artery disease)    a. s/p NSTEMI in 06/2021 with DES to mid-RCA. Residual disease along D1 and 1st Mrg with medical management recommended.   Essential hypertension    Fibromyalgia    Generalized headaches    History of stroke    Noted incidentally by brain MRI July 2022   Mitral regurgitation    a. moderate to severe by echo in 06/2021   Polycythemia    PVC's (premature ventricular contractions)    Renal insufficiency    Sleep apnea    Stroke [facility])    Past Surgical History:  Procedure Laterality Date   ANKLE SURGERY Right    as a child   ANTERIOR CERVICAL DECOMP/DISCECTOMY FUSION N/A 02/06/2023   Procedure: Cervical Five-Cervical Six, Cervical Six-Cervical Seven. Anterior Cervical Decompression/Discectomy Fusion;  Surgeon: Debby Dorn MATSU, MD;  Location: [facility] OR;  Service: Neurosurgery;  Laterality: N/A;   APPENDECTOMY     BREAST BIOPSY Right 2015   Fibroadenoma   CHOLECYSTECTOMY     CORONARY STENT INTERVENTION N/A 07/06/2021   Procedure: CORONARY STENT INTERVENTION;  Surgeon: Wonda Sharper, MD;  Location: [facility]  INVASIVE CV LAB;  Service: Cardiovascular;  Laterality: N/A;   DRUG INDUCED ENDOSCOPY Bilateral 08/06/2022   Procedure: DRUG INDUCED ENDOSCOPY;  Surgeon: Carlie Clark, MD;  Location: [facility];  Service: ENT;  Laterality: Bilateral;   LEFT HEART CATH AND CORONARY ANGIOGRAPHY N/A 07/06/2021   Procedure: LEFT HEART CATH AND CORONARY ANGIOGRAPHY;  Surgeon: Wonda Sharper, MD;  Location: [facility] INVASIVE CV LAB;  Service: Cardiovascular;  Laterality: N/A;   TONSILLECTOMY     Patient Active Problem List   Diagnosis Date Noted   Myotonic muscular dystrophy (HCC) 04/02/2024   Dysphagia 04/02/2024   Weakness 03/01/2024   Muscular dystrophy (HCC) 03/01/2024   Pain 03/01/2024   Capillary disease 02/25/2024   Family history of movement disorder 02/25/2024   Overweight 02/25/2024   Recurrent falls 02/25/2024   Seasonal allergies 02/25/2024   Antiplatelet or antithrombotic long-term use 07/31/2022   BMI 28.0-28.9,adult 07/31/2022   Hemorrhoids 08/21/2021   Constipation 08/21/2021   Herniation of rectum into vagina 08/21/2021   SUI (stress urinary incontinence, female) 08/21/2021   Vaginal pain 08/21/2021   PVC's (premature ventricular contractions) 07/07/2021   Tobacco use 07/06/2021   Mitral valve disorder 07/06/2021   NSTEMI (non-ST elevated myocardial infarction) (HCC) 07/06/2021   Other seasonal allergic rhinitis 05/14/2021   Claustrophobia 09/26/2020   Atrophic vaginitis 09/13/2020   Family history of muscular dystrophy 09/13/2020  Fibromyalgia 09/13/2020   Benign hypertension 09/13/2020   Irregular heart beat 09/13/2020   Mixed hyperlipidemia 09/13/2020   Nephrosclerosis 09/13/2020   Obesity 09/13/2020   Sinusitis 09/13/2020   Vitamin D deficiency 09/13/2020   Difficulty with CPAP full face mask use 06/06/2020   Hepatic steatosis 06/06/2020   Blurry vision 06/03/2020   Difficulty walking 06/03/2020   Dizziness 06/03/2020   Headache disorder 06/03/2020   OSA  (obstructive sleep apnea) 03/28/2020   Complex renal cyst 03/16/2020   Tubulovillous adenoma of colon 12/14/2019   Blood on toilet paper 11/11/2019   Encounter for follow-up examination after completed treatment for conditions other than malignant neoplasm 11/11/2019   Hematochezia 11/11/2019   Family history of cancer 10/19/2019   Polycythemia 10/19/2019   Facial weakness 02/16/2019   Migraine with aura 02/16/2019    PCP: Silvio Ramp MD  REFERRING PROVIDER: Murray Loupe  REFERRING DIAG: M79.7 (ICD-10-CM) - Fibromyalgia   Rationale for Evaluation and Treatment: Rehabilitation  THERAPY DIAG:  Muscle weakness (generalized)  Other abnormalities of gait and mobility  Unsteadiness on feet  ONSET DATE:  fibro since 26; new pain in leg and LB march 2025  SUBJECTIVE:                                                                                                                                                                                           SUBJECTIVE STATEMENT: Pt reports fatigue 6/10; generalized pain 10/10 .  She does gain some pain relief after sessions for her general pain.  From initial evaluation:  Fibromyalgia pain moved into legs and lb hadn't been there before this past March.  Saw neurosurgery but they really didn't find anything.  No MS or spine issues. I am just weak.  Can't open bottles or cans.  Fall often. I've been to PT I n past and it made it worse  PERTINENT HISTORY:  Fibromyalgia falls  PAIN:  Are you having pain? Yes: NPRS scale: current 10/10 Pain location: generalized Pain description: excruciating pain constant Aggravating factors: movement Relieving factors: nothing  PRECAUTIONS: Fall  RED FLAGS: None   WEIGHT BEARING RESTRICTIONS: No  FALLS:  Has patient fallen in last 6 months? Yes. Number of falls 10 just walking and leg give  LIVING ENVIRONMENT: Lives with: lives with their family Lives in: House/apartment Stairs: No Has  following equipment at home: Walker - 4 wheeled cane  OCCUPATION: substitute teacher  ut have  ot been able to work since March  PLOF: Needs assistance with ADLs and Needs assistance with gait  PATIENT GOALS: IDK  NEXT MD VISIT: Aug 18  OBJECTIVE:  Note:  Objective measures were completed at Evaluation unless otherwise noted.  DIAGNOSTIC FINDINGS:  MRI 6/25 cervical spine  FINDINGS: Seven cervical segments are well visualized. Prior cervical fusion at C5-6 and C6-7 are again seen with anterior fixation. No hardware failure is noted. Mild neural foraminal narrowing is noted at C4-5 and C5-6 on the right. The odontoid is within normal limits. Facet hypertrophic changes are noted at multiple levels. No soft tissue abnormality is seen.   IMPRESSION: Degenerative and postsurgical changes in the cervical spine. Neural foraminal narrowing is seen as described.  Thoracic MRI 01/09/24 IMPRESSION: Mild disc disease without significant protrusion, foraminal or spinal stenosis. No acute abnormality.   Lumbar MRI 01/09/24 IMPRESSION: No disc protrusion or evidence of impingement.   L4-5 has mild bilateral degenerative foraminal narrowing.   Mild degenerative disc disease throughout.  PATIENT SURVEYS:  LEFS  Extreme difficulty/unable (0), Quite a bit of difficulty (1), Moderate difficulty (2), Little difficulty (3), No difficulty (4) Survey date:    Any of your usual work, housework or school activities   2. Usual hobbies, recreational or sporting activities   3. Getting into/out of the bath   4. Walking between rooms   5. Putting on socks/shoes   6. Squatting    7. Lifting an object, like a bag of groceries from the floor   8. Performing light activities around your home   9. Performing heavy activities around your home   10. Getting into/out of a car   11. Walking 2 blocks   12. Walking 1 mile   13. Going up/down 10 stairs (1 flight)   14. Standing for 1 hour   15.   sitting for 1 hour   16. Running on even ground   17. Running on uneven ground   18. Making sharp turns while running fast   19. Hopping    20. Rolling over in bed   Score total:  9/80     COGNITION: Overall cognitive status: Within functional limits for tasks assessed     SENSATION: WFL  MUSCLE LENGTH:   POSTURE: rounded shoulders and flexed trunk   PALPATION: No TTP  LUMBAR ROM:  Unable to stand unsupported    LOWER EXTREMITY ROM:     Bilat knee extension limitations due to pain in knees and le  LOWER EXTREMITY MMT:    MMT Right eval Left eval R / L 04/21/24  Hip flexion 3- 3- 3- to 3  Hip extension     Hip abduction 3+ 3+ 3+ /3+  Hip adduction 3+ 3+   Hip internal rotation     Hip external rotation     Knee flexion 3- 3- 3 / 3  Knee extension     Ankle dorsiflexion 3- 3- 3 / 3  Ankle plantarflexion     Ankle inversion     Ankle eversion      (Blank rows = not tested)   FUNCTIONAL TESTS:  Tug: 57.19 using rollator STS from bench heavy use of UE Standing unsupported balance x 5s  GAIT: Distance walked: 400 ft using rollator Assistive device utilized: Walker - 4 wheeled Level of assistance: Modified independence Comments: Pt reports pushing through to make distance without rest but would have preferred a rest period  TREATMENT  [facility] Adult PT Treatment:  Date:04/21/24 Pt seen for aquatic therapy today.  Treatment took place in water 3.5-4.75 ft in depth at the [facility] pool. Temp of water was 91.  Pt entered/exited the pool via chair lift.   - seated in lift chair: alternating LAQ with DF; cycling; hip abdct/ add  - UE on barbell: walking forward across pool x 2 widths (RPE 7/10), with rest break in between each width -backward amb ue support barbell R/L  - UE at wall: df/pf - UE on wall side stepping R/L ~ 10 ft - UE on wall :  hip add/abdct to toe touch x 5 each; heel raises x 5; toe  raises; mini squats x 5 - UE on barbell: walking to lift chair  Pt requires the buoyancy and hydrostatic pressure of water for support, and to offload joints by unweighting joint load by at least 50 % in navel deep water and by at least 75-80% in chest to neck deep water.  Viscosity of the water is needed for resistance of strengthening. Water current perturbations provides challenge to standing balance requiring increased core activation.    PATIENT EDUCATION:  Education details: intro to aquatic therapy; RPE scale Person educated: Patient Education method: Explanation Education comprehension: verbalized understanding  HOME EXERCISE PROGRAM: TBA  ASSESSMENT:  CLINICAL IMPRESSION: PN: Pt reports reduced pain and increased energy with aquatic therapy intervention. Her pain level is constant and high/chronic (9/10). Her strength demonstrates a slight improvement albeit progress is slow. She has missed a few therapy sessions due to Md appts. Has participated in only 4 sessions. She will continue to benefit from skilled aquatic therapy intervention to improvement strength, reduce/manage pain and progress towards all goals.       From initial evaluation:  Patient is a 58 y.o. f who was seen today for physical therapy evaluation and treatment for fibromyalgia.  She presents today using Rolator to setting with reports of extreme fatigue.  She has had an unintentional 90lb weight loss in past 4 months with an onset of extreme pain and weakness in LE and LB.  Hx of fibromyalgia with pain sensitivity primarily in shoulders and cervical spine.  She has had multiple diagnostics completed without any new identified dysfunction/etiology for new symptoms.  She is very weak and unable to tolerate most objective and functional testing.  Discussed baseline level of activity required for participation of aquatics therapy (500 ft to and from setting, changing, actual aquatic treatment then return home) with  concern of ultimate toleration.  She VU of need of cg for transport to and from setting in [place] if using. She has agreed to trial and to be seen x 1 a week to begin.  She is a good candidate for aquatic intervention and will benefit from the properties of water to progress towards functional goals if tolerated.   OBJECTIVE IMPAIRMENTS: Abnormal gait, decreased activity tolerance, decreased balance, decreased endurance, decreased mobility, difficulty walking, decreased strength, and pain.   ACTIVITY LIMITATIONS: carrying, lifting, bending, standing, squatting, sleeping, stairs, transfers, bed mobility, dressing, locomotion level, and caring for others  PARTICIPATION LIMITATIONS: meal prep, cleaning, laundry, driving, shopping, community activity, occupation, and yard work  PERSONAL FACTORS: see [facility] are also affecting patient's functional outcome.   REHAB POTENTIAL: Fair to good. Uncertain pt will tolerate activity related to aquatic intervention  CLINICAL DECISION MAKING: Evolving/moderate complexity  EVALUATION COMPLEXITY: Moderate   GOALS: Goals reviewed with patient? Yes  SHORT TERM GOALS: Target date: 03/31/24  Pt will tolerate full aquatic  sessions consistently without increase in pain and with improving function to demonstrate good toleration and effectiveness of intervention.  Baseline: Goal status: in progress - 03/31/24; Met 04/21/24    LONG TERM GOALS: Target date: 06/04/24  Pt to improve on LEFS by at least 9 point to demonstrate statistically significant Improvement in function. Baseline: 9/80 Goal status: INITIAL  2.  Pt will improve strength in of LE by 1 grade to demonstrate improved overall physical function Baseline:  Goal status: INITIAL  3.  Pt will tolerate walking to or from setting and engaging in aquatic therapy session without excessive fatigue or increase in pain to demonstrate improved toleration to activity. Baseline: WC Goal status: In progress  04/21/24  4.  Pt will report a reduction of overall pain by 25% Baseline:  Goal status: in Progress 04/21/24   PLAN:  PT FREQUENCY: 1x/week  PT DURATION:6 weeks  PLANNED INTERVENTIONS: 97164- PT Re-evaluation, 97750- Physical Performance Testing, 97110-Therapeutic exercises, 97530- Therapeutic activity, [id number]- Neuromuscular re-education, 97535- Self Care, [id number]- Manual therapy, [id number]- Gait training, [phone]- Aquatic Therapy, [phone]- Electrical stimulation (manual), [phone] (1-2 muscles), 20561 (3+ muscles)- Dry Needling, Patient/Family education, Balance training, Stair training, Taping, Joint mobilization, DME instructions, Cryotherapy, and Moist heat.  PLAN FOR NEXT SESSION: Aquatic for general strengthening, toleration to activity and pain reduction   Nautica Hotz) Benedicto Capozzi MPT 04/21/24 12:42 PM [facility] [address] [place], [place], [address] Phone: [phone]   Fax:  [phone]    For all possible CPT codes, reference the Planned Interventions line above.     Check all conditions that are expected to impact treatment: {Conditions expected to impact treatment:Musculoskeletal disorders, Neurological condition and/or seizures, and Social determinants of health   If treatment provided at initial evaluation, no treatment charged due to lack of authorization.

## 2024-04-28 ENCOUNTER — Ambulatory Visit (HOSPITAL_BASED_OUTPATIENT_CLINIC_OR_DEPARTMENT_OTHER): Admitting: Physical Therapy

## 2024-04-28 ENCOUNTER — Encounter (HOSPITAL_BASED_OUTPATIENT_CLINIC_OR_DEPARTMENT_OTHER): Payer: Self-pay | Admitting: Physical Therapy

## 2024-04-28 DIAGNOSIS — R2681 Unsteadiness on feet: Secondary | ICD-10-CM

## 2024-04-28 DIAGNOSIS — R2689 Other abnormalities of gait and mobility: Secondary | ICD-10-CM

## 2024-04-28 DIAGNOSIS — M6281 Muscle weakness (generalized): Secondary | ICD-10-CM

## 2024-04-28 NOTE — Therapy (Signed)
 OUTPATIENT PHYSICAL THERAPY THORACOLUMBAR TREATMENT  Patient Name: Anita Michael MRN: [id number] DOB:01-31-1966, 58 y.o., female Today's Date: 04/28/2024  END OF SESSION:  PT End of Session - 04/28/24 1149     Visit Number 6    Date for Recertification  06/04/24    Authorization Type [place] medicaid    Authorization Time Period 04/26/24-05/25/24    Authorization - Visit Number 1    Authorization - Number of Visits 4    PT Start Time 1154    PT Stop Time 1225    PT Time Calculation (min) 31 min    Activity Tolerance Patient limited by fatigue    Behavior During Therapy [facility] for tasks assessed/performed         Authorized 7 visits from 02/25/24-04/24/24  Past Medical History:  Diagnosis Date   CAD (coronary artery disease)    a. s/p NSTEMI in 06/2021 with DES to mid-RCA. Residual disease along D1 and 1st Mrg with medical management recommended.   Essential hypertension    Fibromyalgia    Generalized headaches    History of stroke    Noted incidentally by brain MRI July 2022   Mitral regurgitation    a. moderate to severe by echo in 06/2021   Polycythemia    PVC's (premature ventricular contractions)    Renal insufficiency    Sleep apnea    Stroke [facility])    Past Surgical History:  Procedure Laterality Date   ANKLE SURGERY Right    as a child   ANTERIOR CERVICAL DECOMP/DISCECTOMY FUSION N/A 02/06/2023   Procedure: Cervical Five-Cervical Six, Cervical Six-Cervical Seven. Anterior Cervical Decompression/Discectomy Fusion;  Surgeon: Debby Dorn MATSU, MD;  Location: [facility] OR;  Service: Neurosurgery;  Laterality: N/A;   APPENDECTOMY     BREAST BIOPSY Right 2015   Fibroadenoma   CHOLECYSTECTOMY     CORONARY STENT INTERVENTION N/A 07/06/2021   Procedure: CORONARY STENT INTERVENTION;  Surgeon: Wonda Sharper, MD;  Location: [facility] INVASIVE CV LAB;  Service: Cardiovascular;  Laterality: N/A;   DRUG INDUCED ENDOSCOPY Bilateral 08/06/2022   Procedure: DRUG INDUCED ENDOSCOPY;  Surgeon:  Carlie Clark, MD;  Location: [facility];  Service: ENT;  Laterality: Bilateral;   LEFT HEART CATH AND CORONARY ANGIOGRAPHY N/A 07/06/2021   Procedure: LEFT HEART CATH AND CORONARY ANGIOGRAPHY;  Surgeon: Wonda Sharper, MD;  Location: [facility] INVASIVE CV LAB;  Service: Cardiovascular;  Laterality: N/A;   TONSILLECTOMY     Patient Active Problem List   Diagnosis Date Noted   Myotonic muscular dystrophy (HCC) 04/02/2024   Dysphagia 04/02/2024   Weakness 03/01/2024   Muscular dystrophy (HCC) 03/01/2024   Pain 03/01/2024   Capillary disease 02/25/2024   Family history of movement disorder 02/25/2024   Overweight 02/25/2024   Recurrent falls 02/25/2024   Seasonal allergies 02/25/2024   Antiplatelet or antithrombotic long-term use 07/31/2022   BMI 28.0-28.9,adult 07/31/2022   Hemorrhoids 08/21/2021   Constipation 08/21/2021   Herniation of rectum into vagina 08/21/2021   SUI (stress urinary incontinence, female) 08/21/2021   Vaginal pain 08/21/2021   PVC's (premature ventricular contractions) 07/07/2021   Tobacco use 07/06/2021   Mitral valve disorder 07/06/2021   NSTEMI (non-ST elevated myocardial infarction) (HCC) 07/06/2021   Other seasonal allergic rhinitis 05/14/2021   Claustrophobia 09/26/2020   Atrophic vaginitis 09/13/2020   Family history of muscular dystrophy 09/13/2020   Fibromyalgia 09/13/2020   Benign hypertension 09/13/2020   Irregular heart beat 09/13/2020   Mixed hyperlipidemia 09/13/2020   Nephrosclerosis 09/13/2020   Obesity  09/13/2020   Sinusitis 09/13/2020   Vitamin D deficiency 09/13/2020   Difficulty with CPAP full face mask use 06/06/2020   Hepatic steatosis 06/06/2020   Blurry vision 06/03/2020   Difficulty walking 06/03/2020   Dizziness 06/03/2020   Headache disorder 06/03/2020   OSA (obstructive sleep apnea) 03/28/2020   Complex renal cyst 03/16/2020   Tubulovillous adenoma of colon 12/14/2019   Blood on toilet paper 11/11/2019    Encounter for follow-up examination after completed treatment for conditions other than malignant neoplasm 11/11/2019   Hematochezia 11/11/2019   Family history of cancer 10/19/2019   Polycythemia 10/19/2019   Facial weakness 02/16/2019   Migraine with aura 02/16/2019    PCP: Silvio Ramp MD  REFERRING PROVIDER: Murray Loupe  REFERRING DIAG: M79.7 (ICD-10-CM) - Fibromyalgia   Rationale for Evaluation and Treatment: Rehabilitation  THERAPY DIAG:  Muscle weakness (generalized)  Other abnormalities of gait and mobility  Unsteadiness on feet  ONSET DATE:  fibro since 26; new pain in leg and LB march 2025  SUBJECTIVE:                                                                                                                                                                                           SUBJECTIVE STATEMENT: Pt reports fatigue 5/10; generalized pain 8/10 .  She reports she gets a few hours of relief, but pain returns.  She reports that her strength feels about the same.  Pt reports she has had 2 falls last week, where her legs gave out.    From initial evaluation:  Fibromyalgia pain moved into legs and lb hadn't been there before this past March.  Saw neurosurgery but they really didn't find anything.  No MS or spine issues. I am just weak.  Can't open bottles or cans.  Fall often. I've been to PT I n past and it made it worse  PERTINENT HISTORY:  Fibromyalgia falls  PAIN:  Are you having pain? Yes: NPRS scale: current 8/10 Pain location: generalized Pain description: excruciating pain constant Aggravating factors: movement Relieving factors: nothing  PRECAUTIONS: Fall  RED FLAGS: None   WEIGHT BEARING RESTRICTIONS: No  FALLS:  Has patient fallen in last 6 months? Yes. Number of falls 10 just walking and leg give  LIVING ENVIRONMENT: Lives with: lives with their family Lives in: House/apartment Stairs: No Has following equipment at home: Walker -  4 wheeled cane  OCCUPATION: substitute teacher  ut have  ot been able to work since March  PLOF: Needs assistance with ADLs and Needs assistance with gait  PATIENT GOALS: not known.  NEXT MD VISIT:   OBJECTIVE:  Note: Objective measures were completed at Evaluation unless otherwise noted.  DIAGNOSTIC FINDINGS:  MRI 6/25 cervical spine  FINDINGS: Seven cervical segments are well visualized. Prior cervical fusion at C5-6 and C6-7 are again seen with anterior fixation. No hardware failure is noted. Mild neural foraminal narrowing is noted at C4-5 and C5-6 on the right. The odontoid is within normal limits. Facet hypertrophic changes are noted at multiple levels. No soft tissue abnormality is seen.   IMPRESSION: Degenerative and postsurgical changes in the cervical spine. Neural foraminal narrowing is seen as described.  Thoracic MRI 01/09/24 IMPRESSION: Mild disc disease without significant protrusion, foraminal or spinal stenosis. No acute abnormality.   Lumbar MRI 01/09/24 IMPRESSION: No disc protrusion or evidence of impingement.   L4-5 has mild bilateral degenerative foraminal narrowing.   Mild degenerative disc disease throughout.  PATIENT SURVEYS:  LEFS  Extreme difficulty/unable (0), Quite a bit of difficulty (1), Moderate difficulty (2), Little difficulty (3), No difficulty (4) Survey date:    Any of your usual work, housework or school activities   2. Usual hobbies, recreational or sporting activities   3. Getting into/out of the bath   4. Walking between rooms   5. Putting on socks/shoes   6. Squatting    7. Lifting an object, like a bag of groceries from the floor   8. Performing light activities around your home   9. Performing heavy activities around your home   10. Getting into/out of a car   11. Walking 2 blocks   12. Walking 1 mile   13. Going up/down 10 stairs (1 flight)   14. Standing for 1 hour   15.  sitting for 1 hour   16. Running on even  ground   17. Running on uneven ground   18. Making sharp turns while running fast   19. Hopping    20. Rolling over in bed   Score total:  9/80     COGNITION: Overall cognitive status: Within functional limits for tasks assessed     SENSATION: WFL  MUSCLE LENGTH:   POSTURE: rounded shoulders and flexed trunk   PALPATION: No TTP  LUMBAR ROM:  Unable to stand unsupported    LOWER EXTREMITY ROM:     Bilat knee extension limitations due to pain in knees and le  LOWER EXTREMITY MMT:    MMT Right eval Left eval R / L 04/21/24  Hip flexion 3- 3- 3- to 3  Hip extension     Hip abduction 3+ 3+ 3+ /3+  Hip adduction 3+ 3+   Hip internal rotation     Hip external rotation     Knee flexion 3- 3- 3 / 3  Knee extension     Ankle dorsiflexion 3- 3- 3 / 3  Ankle plantarflexion     Ankle inversion     Ankle eversion      (Blank rows = not tested)   FUNCTIONAL TESTS:  Tug: 57.19 using rollator STS from bench heavy use of UE Standing unsupported balance x 5s  GAIT: Distance walked: 400 ft using rollator Assistive device utilized: Walker - 4 wheeled Level of assistance: Modified independence Comments: Pt reports pushing through to make distance without rest but would have preferred a rest period  TREATMENT  [facility] Adult PT Treatment:  Date:04/28/24 Pt seen for aquatic therapy today.  Treatment took place in water 3.5-4.75 ft in depth at the [facility] pool. Temp of water was 91.  Pt entered/exited the pool via chair lift.   - seated in lift chair: alternating LAQ with DF; cycling; hip abdct/ add  - UE on barbell: walking backward and forward 2 laps, (RPE 5-6/10), with rest break in between each width -UE on barbell: side stepping R/L, rest break - trial of straddling noodle and UE on wall with gentle bicycle legs -> stopped; too difficult to maintain upright trunk - UE at wall: heel raises x 5; hip abdct/ add x 5  each - UE on wall side stepping R/L ~ 10 ft - UE on barbell: walking backward/forward and to lift chair  Pt requires the buoyancy and hydrostatic pressure of water for support, and to offload joints by unweighting joint load by at least 50 % in navel deep water and by at least 75-80% in chest to neck deep water.  Viscosity of the water is needed for resistance of strengthening. Water current perturbations provides challenge to standing balance requiring increased core activation.    PATIENT EDUCATION:  Education details: aquatic therapy exercise modifications; RPE scale Person educated: Patient Education method: Explanation Education comprehension: verbalized understanding  HOME EXERCISE PROGRAM: TBA  ASSESSMENT:  CLINICAL IMPRESSION:  Pt reports reduced pain during aquatic therapy intervention. Pt unable to balance on noodle between legs, despite UE support on wall.  Overall, exercise tolerated with 5-6/10 RPE. Pt able to tolerate about 31 min today. She will continue to benefit from skilled aquatic therapy intervention to improvement strength, reduce/manage pain and progress towards all goals.       From initial evaluation:  Patient is a 58 y.o. f who was seen today for physical therapy evaluation and treatment for fibromyalgia.  She presents today using Rolator to setting with reports of extreme fatigue.  She has had an unintentional 90lb weight loss in past 4 months with an onset of extreme pain and weakness in LE and LB.  Hx of fibromyalgia with pain sensitivity primarily in shoulders and cervical spine.  She has had multiple diagnostics completed without any new identified dysfunction/etiology for new symptoms.  She is very weak and unable to tolerate most objective and functional testing.  Discussed baseline level of activity required for participation of aquatics therapy (500 ft to and from setting, changing, actual aquatic treatment then return home) with concern of ultimate  toleration.  She VU of need of cg for transport to and from setting in [place] if using. She has agreed to trial and to be seen x 1 a week to begin.  She is a good candidate for aquatic intervention and will benefit from the properties of water to progress towards functional goals if tolerated.   OBJECTIVE IMPAIRMENTS: Abnormal gait, decreased activity tolerance, decreased balance, decreased endurance, decreased mobility, difficulty walking, decreased strength, and pain.   ACTIVITY LIMITATIONS: carrying, lifting, bending, standing, squatting, sleeping, stairs, transfers, bed mobility, dressing, locomotion level, and caring for others  PARTICIPATION LIMITATIONS: meal prep, cleaning, laundry, driving, shopping, community activity, occupation, and yard work  PERSONAL FACTORS: see [facility] are also affecting patient's functional outcome.   REHAB POTENTIAL: Fair to good. Uncertain pt will tolerate activity related to aquatic intervention  CLINICAL DECISION MAKING: Evolving/moderate complexity  EVALUATION COMPLEXITY: Moderate   GOALS: Goals reviewed with patient? Yes  SHORT TERM GOALS: Target date: 03/31/24  Pt will tolerate full aquatic sessions consistently  without increase in pain and with improving function to demonstrate good toleration and effectiveness of intervention.  Baseline: Goal status: in progress - 03/31/24; Met 04/21/24    LONG TERM GOALS: Target date: 06/04/24  Pt to improve on LEFS by at least 9 point to demonstrate statistically significant Improvement in function. Baseline: 9/80 Goal status: INITIAL  2.  Pt will improve strength in of LE by 1 grade to demonstrate improved overall physical function Baseline:  Goal status: INITIAL  3.  Pt will tolerate walking to or from setting and engaging in aquatic therapy session without excessive fatigue or increase in pain to demonstrate improved toleration to activity. Baseline: WC Goal status: In progress 04/21/24  4.  Pt will  report a reduction of overall pain by 25% Baseline:  Goal status: in Progress 04/21/24   PLAN:  PT FREQUENCY: 1x/week  PT DURATION:6 weeks  PLANNED INTERVENTIONS: 97164- PT Re-evaluation, 97750- Physical Performance Testing, 97110-Therapeutic exercises, 97530- Therapeutic activity, [id number]- Neuromuscular re-education, 97535- Self Care, [id number]- Manual therapy, [id number]- Gait training, [phone]- Aquatic Therapy, [phone]- Electrical stimulation (manual), [phone] (1-2 muscles), 20561 (3+ muscles)- Dry Needling, Patient/Family education, Balance training, Stair training, Taping, Joint mobilization, DME instructions, Cryotherapy, and Moist heat.  PLAN FOR NEXT SESSION: Aquatic for general strengthening, toleration to activity and pain reduction   Delon Aquas, PTA 04/28/24 12:51 PM [facility] [address] [place], [place], [address] Phone: [phone]   Fax:  [phone]     For all possible CPT codes, reference the Planned Interventions line above.     Check all conditions that are expected to impact treatment: {Conditions expected to impact treatment:Musculoskeletal disorders, Neurological condition and/or seizures, and Social determinants of health   If treatment provided at initial evaluation, no treatment charged due to lack of authorization.

## 2024-04-30 ENCOUNTER — Telehealth: Payer: Self-pay | Admitting: Physical Medicine & Rehabilitation

## 2024-04-30 NOTE — Telephone Encounter (Signed)
 P needs refill of tramadol   [facility]

## 2024-05-03 ENCOUNTER — Ambulatory Visit (HOSPITAL_BASED_OUTPATIENT_CLINIC_OR_DEPARTMENT_OTHER): Admitting: Physical Therapy

## 2024-05-03 ENCOUNTER — Other Ambulatory Visit: Payer: Self-pay | Admitting: Physical Medicine & Rehabilitation

## 2024-05-03 ENCOUNTER — Encounter (HOSPITAL_BASED_OUTPATIENT_CLINIC_OR_DEPARTMENT_OTHER): Payer: Self-pay | Admitting: Physical Therapy

## 2024-05-03 DIAGNOSIS — R2689 Other abnormalities of gait and mobility: Secondary | ICD-10-CM

## 2024-05-03 DIAGNOSIS — M6281 Muscle weakness (generalized): Secondary | ICD-10-CM | POA: Diagnosis not present

## 2024-05-03 DIAGNOSIS — R2681 Unsteadiness on feet: Secondary | ICD-10-CM

## 2024-05-03 MED ORDER — TRAMADOL HCL 50 MG PO TABS: 50.0000 mg | ORAL_TABLET | Freq: Three times a day (TID) | ORAL | 2 refills | Status: DC | PRN

## 2024-05-03 NOTE — Therapy (Signed)
 OUTPATIENT PHYSICAL THERAPY THORACOLUMBAR TREATMENT  Patient Name: Anita Michael MRN: [id number] DOB:03/16/1966, 58 y.o., female Today's Date: 05/03/2024  END OF SESSION:  PT End of Session - 05/03/24 1155     Visit Number 7    Date for Recertification  06/04/24    Authorization Type [place] med[place]    Authorization Time Period 04/26/24-05/25/24    Authorization - Number of Visits 4    PT Start Time 1150    PT Stop Time 1230    PT Time Calculation (min) 40 min    Activity Tolerance Patient limited by fatigue    Behavior During Therapy [facility] for tasks assessed/performed         Authorized 7 visits from 02/25/24-04/24/24  Past Medical History:  Diagnosis Date   CAD (coronary artery disease)    a. s/p NSTEMI in 06/2021 with DES to mid-RCA. Residual disease along D1 and 1st Mrg with medical management recommended.   Essential hypertension    Fibromyalgia    Generalized headaches    History of stroke    Noted incidentally by brain MRI July 2022   Mitral regurgitation    a. moderate to severe by echo in 06/2021   Polycythemia    PVC's (premature ventricular contractions)    Renal insufficiency    Sleep apnea    Stroke [facility])    Past Surgical History:  Procedure Laterality Date   ANKLE SURGERY Right    as a child   ANTERIOR CERVICAL DECOMP/DISCECTOMY FUSION N/A 02/06/2023   Procedure: Cervical Five-Cervical Six, Cervical Six-Cervical Seven. Anterior Cervical Decompression/Discectomy Fusion;  Surgeon: Debby Dorn MATSU, MD;  Location: [facility] OR;  Service: Neurosurgery;  Laterality: N/A;   APPENDECTOMY     BREAST BIOPSY Right 2015   Fibroadenoma   CHOLECYSTECTOMY     CORONARY STENT INTERVENTION N/A 07/06/2021   Procedure: CORONARY STENT INTERVENTION;  Surgeon: Wonda Sharper, MD;  Location: [facility] INVASIVE CV LAB;  Service: Cardiovascular;  Laterality: N/A;   DRUG INDUCED ENDOSCOPY Bilateral 08/06/2022   Procedure: DRUG INDUCED ENDOSCOPY;  Surgeon: Carlie Clark, MD;  Location: [facility];  Service: ENT;  Laterality: Bilateral;   LEFT HEART CATH AND CORONARY ANGIOGRAPHY N/A 07/06/2021   Procedure: LEFT HEART CATH AND CORONARY ANGIOGRAPHY;  Surgeon: Wonda Sharper, MD;  Location: [facility] INVASIVE CV LAB;  Service: Cardiovascular;  Laterality: N/A;   TONSILLECTOMY     Patient Active Problem List   Diagnosis Date Noted   Myotonic muscular dystrophy (HCC) 04/02/2024   Dysphagia 04/02/2024   Weakness 03/01/2024   Muscular dystrophy (HCC) 03/01/2024   Pain 03/01/2024   Capillary disease 02/25/2024   Family history of movement disorder 02/25/2024   Overweight 02/25/2024   Recurrent falls 02/25/2024   Seasonal allergies 02/25/2024   Antiplatelet or antithrombotic long-term use 07/31/2022   BMI 28.0-28.9,adult 07/31/2022   Hemorrhoids 08/21/2021   Constipation 08/21/2021   Herniation of rectum into vagina 08/21/2021   SUI (stress urinary incontinence, female) 08/21/2021   Vaginal pain 08/21/2021   PVC's (premature ventricular contractions) 07/07/2021   Tobacco use 07/06/2021   Mitral valve disorder 07/06/2021   NSTEMI (non-ST elevated myocardial infarction) (HCC) 07/06/2021   Other seasonal allergic rhinitis 05/14/2021   Claustrophobia 09/26/2020   Atrophic vaginitis 09/13/2020   Family history of muscular dystrophy 09/13/2020   Fibromyalgia 09/13/2020   Benign hypertension 09/13/2020   Irregular heart beat 09/13/2020   Mixed hyperlipidemia 09/13/2020   Nephrosclerosis 09/13/2020   Obesity 09/13/2020   Sinusitis 09/13/2020   Vitamin  D deficiency 09/13/2020   Difficulty with CPAP full face mask use 06/06/2020   Hepatic steatosis 06/06/2020   Blurry vision 06/03/2020   Difficulty walking 06/03/2020   Dizziness 06/03/2020   Headache disorder 06/03/2020   OSA (obstructive sleep apnea) 03/28/2020   Complex renal cyst 03/16/2020   Tubulovillous adenoma of colon 12/14/2019   Blood on toilet paper 11/11/2019   Encounter for follow-up examination after  completed treatment for conditions other than malignant neoplasm 11/11/2019   Hematochezia 11/11/2019   Family history of cancer 10/19/2019   Polycythemia 10/19/2019   Facial weakness 02/16/2019   Migraine with aura 02/16/2019    PCP: Silvio Ramp MD  REFERRING PROVIDER: Murray Loupe  REFERRING DIAG: M79.7 (ICD-10-CM) - Fibromyalgia   Rationale for Evaluation and Treatment: Rehabilitation  THERAPY DIAG:  Muscle weakness (generalized)  Other abnormalities of gait and mobility  Unsteadiness on feet  ONSET DATE:  fibro since 26; new pain in leg and LB march 2025  SUBJECTIVE:                                                                                                                                                                                           SUBJECTIVE STATEMENT: Pt reports high pain level for past 3 days 9-10/10.  Has run out of pain meds. Called MD for refill.   From initial evaluation:  Fibromyalgia pain moved into legs and lb hadn't been there before this past March.  Saw neurosurgery but they really didn't find anything.  No MS or spine issues. I am just weak.  Can't open bottles or cans.  Fall often. I've been to PT I n past and it made it worse  PERTINENT HISTORY:  Fibromyalgia falls  PAIN:  Are you having pain? Yes: NPRS scale: current 8/10 Pain location: generalized Pain description: excruciating pain constant Aggravating factors: movement Relieving factors: nothing  PRECAUTIONS: Fall  RED FLAGS: None   WEIGHT BEARING RESTRICTIONS: No  FALLS:  Has patient fallen in last 6 months? Yes. Number of falls 10 just walking and leg give  LIVING ENVIRONMENT: Lives with: lives with their family Lives in: House/apartment Stairs: No Has following equipment at home: Walker - 4 wheeled cane  OCCUPATION: substitute teacher  ut have  ot been able to work since March  PLOF: Needs assistance with ADLs and Needs assistance with gait  PATIENT  GOALS: not known.  NEXT MD VISIT:   OBJECTIVE:  Note: Objective measures were completed at Evaluation unless otherwise noted.  DIAGNOSTIC FINDINGS:  MRI 6/25 cervical spine  FINDINGS: Seven cervical segments are well visualized. Prior cervical fusion at C5-6 and C6-7  are again seen with anterior fixation. No hardware failure is noted. Mild neural foraminal narrowing is noted at C4-5 and C5-6 on the right. The odontoid is within normal limits. Facet hypertrophic changes are noted at multiple levels. No soft tissue abnormality is seen.   IMPRESSION: Degenerative and postsurgical changes in the cervical spine. Neural foraminal narrowing is seen as described.  Thoracic MRI 01/09/24 IMPRESSION: Mild disc disease without significant protrusion, foraminal or spinal stenosis. No acute abnormality.   Lumbar MRI 01/09/24 IMPRESSION: No disc protrusion or evidence of impingement.   L4-5 has mild bilateral degenerative foraminal narrowing.   Mild degenerative disc disease throughout.  PATIENT SURVEYS:  LEFS  Extreme difficulty/unable (0), Quite a bit of difficulty (1), Moderate difficulty (2), Little difficulty (3), No difficulty (4) Survey date:    Any of your usual work, housework or school activities   2. Usual hobbies, recreational or sporting activities   3. Getting into/out of the bath   4. Walking between rooms   5. Putting on socks/shoes   6. Squatting    7. Lifting an object, like a bag of groceries from the floor   8. Performing light activities around your home   9. Performing heavy activities around your home   10. Getting into/out of a car   11. Walking 2 blocks   12. Walking 1 mile   13. Going up/down 10 stairs (1 flight)   14. Standing for 1 hour   15.  sitting for 1 hour   16. Running on even ground   17. Running on uneven ground   18. Making sharp turns while running fast   19. Hopping    20. Rolling over in bed   Score total:  9/80      COGNITION: Overall cognitive status: Within functional limits for tasks assessed     SENSATION: WFL  MUSCLE LENGTH:   POSTURE: rounded shoulders and flexed trunk   PALPATION: No TTP  LUMBAR ROM:  Unable to stand unsupported    LOWER EXTREMITY ROM:     Bilat knee extension limitations due to pain in knees and le  LOWER EXTREMITY MMT:    MMT Right eval Left eval R / L 04/21/24  Hip flexion 3- 3- 3- to 3  Hip extension     Hip abduction 3+ 3+ 3+ /3+  Hip adduction 3+ 3+   Hip internal rotation     Hip external rotation     Knee flexion 3- 3- 3 / 3  Knee extension     Ankle dorsiflexion 3- 3- 3 / 3  Ankle plantarflexion     Ankle inversion     Ankle eversion      (Blank rows = not tested)   FUNCTIONAL TESTS:  Tug: 57.19 using rollator STS from bench heavy use of UE Standing unsupported balance x 5s  GAIT: Distance walked: 400 ft using rollator Assistive device utilized: Walker - 4 wheeled Level of assistance: Modified independence Comments: Pt reports pushing through to make distance without rest but would have preferred a rest period  TREATMENT  [facility] Adult PT Treatment:                                             Date:05/03/24 Pt seen for aquatic therapy today.  Treatment took place in water 3.5-4.75 ft in depth at the [facility] pool. Temp  of water was 91.  Pt entered/exited the pool via chair lift.   - seated in lift chair: alternating LAQ with DF; cycling; hip abdct/ add  - UE on barbell: walking backward and forward  -Rest break at each wall - UE on wall side stepping R/L ~ 10 ft - UE on barbell: walking backward/forward and to lift chair  Pt requires the buoyancy and hydrostatic pressure of water for support, and to offload joints by unweighting joint load by at least 50 % in navel deep water and by at least 75-80% in chest to neck deep water.  Viscosity of the water is needed for resistance of strengthening. Water current perturbations  provides challenge to standing balance requiring increased core activation.    PATIENT EDUCATION:  Education details: aquatic therapy exercise modifications; RPE scale Person educated: Patient Education method: Explanation Education comprehension: verbalized understanding  HOME EXERCISE PROGRAM: TBA  ASSESSMENT:  CLINICAL IMPRESSION: Pt presents with very high pain sensitivity.  She tolerates very minimal movement. Reports the warm water tempers her pain while she is submerged. She is guarded with strained expression entire session. Message sent to MD encouraging a response to pt message on Friday. She will continue to benefit from skilled aquatic therapy intervention to improvement strength, reduce/manage pain and progress towards all goals.       From initial evaluation:  Patient is a 58 y.o. f who was seen today for physical therapy evaluation and treatment for fibromyalgia.  She presents today using Rolator to setting with reports of extreme fatigue.  She has had an unintentional 90lb weight loss in past 4 months with an onset of extreme pain and weakness in LE and LB.  Hx of fibromyalgia with pain sensitivity primarily in shoulders and cervical spine.  She has had multiple diagnostics completed without any new identified dysfunction/etiology for new symptoms.  She is very weak and unable to tolerate most objective and functional testing.  Discussed baseline level of activity required for participation of aquatics therapy (500 ft to and from setting, changing, actual aquatic treatment then return home) with concern of ultimate toleration.  She VU of need of cg for transport to and from setting in [place] if using. She has agreed to trial and to be seen x 1 a week to begin.  She is a good candidate for aquatic intervention and will benefit from the properties of water to progress towards functional goals if tolerated.   OBJECTIVE IMPAIRMENTS: Abnormal gait, decreased activity tolerance,  decreased balance, decreased endurance, decreased mobility, difficulty walking, decreased strength, and pain.   ACTIVITY LIMITATIONS: carrying, lifting, bending, standing, squatting, sleeping, stairs, transfers, bed mobility, dressing, locomotion level, and caring for others  PARTICIPATION LIMITATIONS: meal prep, cleaning, laundry, driving, shopping, community activity, occupation, and yard work  PERSONAL FACTORS: see [facility] are also affecting patient's functional outcome.   REHAB POTENTIAL: Fair to good. Uncertain pt will tolerate activity related to aquatic intervention  CLINICAL DECISION MAKING: Evolving/moderate complexity  EVALUATION COMPLEXITY: Moderate   GOALS: Goals reviewed with patient? Yes  SHORT TERM GOALS: Target date: 03/31/24  Pt will tolerate full aquatic sessions consistently without increase in pain and with improving function to demonstrate good toleration and effectiveness of intervention.  Baseline: Goal status: in progress - 03/31/24; Met 04/21/24    LONG TERM GOALS: Target date: 06/04/24  Pt to improve on LEFS by at least 9 point to demonstrate statistically significant Improvement in function. Baseline: 9/80 Goal status: INITIAL  2.  Pt will improve  strength in of LE by 1 grade to demonstrate improved overall physical function Baseline:  Goal status: INITIAL  3.  Pt will tolerate walking to or from setting and engaging in aquatic therapy session without excessive fatigue or increase in pain to demonstrate improved toleration to activity. Baseline: WC Goal status: In progress 04/21/24  4.  Pt will report a reduction of overall pain by 25% Baseline:  Goal status: in Progress 04/21/24   PLAN:  PT FREQUENCY: 1x/week  PT DURATION:6 weeks  PLANNED INTERVENTIONS: 97164- PT Re-evaluation, 97750- Physical Performance Testing, 97110-Therapeutic exercises, 97530- Therapeutic activity, [id number]- Neuromuscular re-education, 97535- Self Care, [id number]- Manual therapy,  [id number]- Gait training, [phone]- Aquatic Therapy, [phone]- Electrical stimulation (manual), [phone] (1-2 muscles), [id number] (3+ muscles)- Dry Needling, Patient/Family education, Balance training, Stair training, Taping, Joint mobilization, DME instructions, Cryotherapy, and Moist heat.  PLAN FOR NEXT SESSION: Aquatic for general strengthening, toleration to activity and pain reduction   Aislin Onofre) Genecis Veley MPT 05/03/24 12:28 PM [facility] [address] [place], [place], [address] Phone: [phone]   Fax:  [phone]      For all possible CPT codes, reference the Planned Interventions line above.     Check all conditions that are expected to impact treatment: {Conditions expected to impact treatment:Musculoskeletal disorders, Neurological condition and/or seizures, and Social determinants of health   If treatment provided at initial evaluation, no treatment charged due to lack of authorization.

## 2024-05-04 ENCOUNTER — Encounter: Attending: Physical Medicine & Rehabilitation | Admitting: Physical Medicine & Rehabilitation

## 2024-05-04 ENCOUNTER — Encounter: Payer: Self-pay | Admitting: Physical Medicine & Rehabilitation

## 2024-05-04 VITALS — BP 134/84 | HR 103 | Ht 67.5 in | Wt 147.0 lb

## 2024-05-04 DIAGNOSIS — G894 Chronic pain syndrome: Secondary | ICD-10-CM | POA: Diagnosis present

## 2024-05-04 DIAGNOSIS — M797 Fibromyalgia: Secondary | ICD-10-CM | POA: Diagnosis not present

## 2024-05-04 DIAGNOSIS — Z79899 Other long term (current) drug therapy: Secondary | ICD-10-CM | POA: Insufficient documentation

## 2024-05-04 DIAGNOSIS — G7111 Myotonic muscular dystrophy: Secondary | ICD-10-CM | POA: Insufficient documentation

## 2024-05-04 NOTE — Progress Notes (Addendum)
 " HPI  Anita Michael is a 58 y.o. year old female  who  has a past medical history of CAD (coronary artery disease), Essential hypertension, Fibromyalgia, Generalized headaches, History of stroke, Mitral regurgitation, Polycythemia, PVC's (premature ventricular contractions), Renal insufficiency, Sleep apnea, and Stroke (HCC).   They are presenting to PM&R clinic as a new patient for pain management evaluation. They were referred by Silvio Ramp, NP for treatment of chronic bodywide pain pain.  Thank you for referral of this patient.  Patient is here with her daughter.  She reports that she has long hx of fibromyalgia, being diagnosed with this condition when she was 26.   Patient says for a long time that her issues with pain were primarily in her shoulders, arms and neck.  Around March 2025 her pain became worse and spread to her lower back and legs.  She reports feeling weak throughout her body and had several falls, now walking with a rolling walker.  Patient reports she is walking with much better stability since she has started using the rolling walker.  Her pain is particularly severe at night.  She was previously on Cymbalta  for pain, this medication was restarted last month.  She is not sure how much Cymbalta  is helping.  She feels like she is tolerating Cymbalta  overall but it does occasionally cause mild nausea.    She had MRI of L spine, C spine and T spine completed a few days ago- results pending.  This study was ordered by neurosurgery.   Patient reports she was previously doing some light exercises but she stopped this in April due to the pain.  Patient had a ACDF C5-7 on 02/06/2023  by Dorn Ned for cervical myelopathy.  She reports that her upper extremity pain and strength did improve after the surgery.  Denies bowel or bladder incontinence.  Patient has used tramadol   which did take the edge off her pain.  Patient worked with physical therapy last year, reports this greatly  worsened her pain after doing it.  She did try aquatic therapy in the past and found this to be helpful.   Reports she saw rheumatology in the past who felt fibromyalgia was likely diagnosis.  Red flag symptoms: No red flags for back pain endorsed in Hx or ROS  Medications tried: Topical medications Aspercream helps a little  Nsaids -  not recently  Tylenol   - Tylenol - helps slightly  Opiates - Tramadol - helped a little  Gabapentin  - swelling Lyrica - didn't help , put me in [place] TCAs - Does not recall  SNRIs - Taking cymbalta - some nausea with it  Robaxin  helped a little  Other treatments: PT- Last year after surgery, caused a lot of pain. Pool therapy helped in the past.  TENs unit- denies Injections- denies Surgery - C spine surgery in 2024 as above    Prior UDS results: No results found for: LABOPIA, COCAINSCRNUR, LABBENZ, AMPHETMU, THCU, LABBARB   Interval History 03/08/24 Reports pain all over body, present pretty much all the time. Tramadol   provides variable relief - sometimes works a little, sometimes doesn't seem to work as well. Pain levels range from lowest of 7 when medication effective to above 10 when medication ineffective. Tramadol  duration 4-6 hours when working.  Continues Cymbalta , recently increased to 60mg  from 30mg  by neurology. Still experiencing nausea from Cymbalta  but reports able to tolerate medication and now able to eat. Uncertain about pain relief benefit from Cymbalta .  Sleep remains  poor. Recently started seeing neurologist who prescribed sleep medication (nortriptyline , 2 capsules at night) approximately 1 week ago. No improvement in pain noted from nortriptyline . She is sleeping better.   Discontinued Robaxin  (methocarbamol ) muscle relaxer - not using much.  Physical therapy scheduled to start 03/22/2024 (aquatic therapy).  Performs seated exercises including arm movements and getting up/down from chair.  Neurology  evaluating for myotonic muscular dystrophy   Interval History 05/04/24 Reports significant pain since running out of Tramadol  medication on Friday. The medication was refilled yesterday and is ready for pickup. Prior to running out, the medication provided reasonable pain control, lasting long enough between doses generally.   Reports recent diagnosis from neurology: Type 1 Myotonic Muscular Dystrophy, confirmed genetically. Pt says neurologist (Dr. Onita) advised against treatment options due to potential cardiac effects, given PMHx of heart attack and strokes. There is a significant family history of myotonic dystrophy. Pt believes the condition may have been activated by other health stressors.  Complains of worsening weakness, difficulty with stairs. Also reports constant pain throughout her body, related to fibromyalgia.  Reports feeling constantly cold.  Denies breathing difficulty but feels like a cold is starting.  Reports gastrointestinal upset, including poor appetite, nausea with the smell of food, and constant gas. This has been ongoing for some time and is not new. Bowel movements are regular. Unsure if it is related to a new, unnamed medication. Plans to discuss with the prescribing doctor at an upcoming appointment.  Lives with 32 year old grandson who provides significant help. Oldest son also visits daily to assist.  Physical therapy with aquatics helps temporarily in the warm water, but pain returns after.  Pain Inventory Average Pain 10 Pain Right Now 10 My pain is constant  In the last 24 hours, has pain interfered with the following? General activity 2 Relation with others 0 Enjoyment of life 7 What TIME of day is your pain at its worst? morning , daytime, evening, and night Sleep (in general) Poor  Pain is worse with: walking, bending, sitting, inactivity, standing, and some activites Pain improves with: medication Relief from Meds: 2      Family History   Problem Relation Age of Onset   Breast cancer Mother 64   Breast cancer Sister 54   Breast cancer Maternal Grandmother 84   Breast cancer Sister 6   Social History   Socioeconomic History   Marital status: Widowed    Spouse name: Not on file   Number of children: 6   Years of education: Not on file   Highest education level: Not on file  Occupational History   Not on file  Tobacco Use   Smoking status: Every Day    Current packs/day: 0.25    Average packs/day: 0.3 packs/day for 1.8 years (0.4 ttl pk-yrs)    Types: Cigarettes    Start date: 2024    Last attempt to quit: 02/07/2019   Smokeless tobacco: Never   Tobacco comments:    1 cigarette a day   Vaping Use   Vaping status: Never Used  Substance and Sexual Activity   Alcohol use: Yes    Comment: 1x a year   Drug use: No   Sexual activity: Not Currently    Birth control/protection: Post-menopausal  Other Topics Concern   Not on file  Social History Narrative   Right handed   Caffeine- 1 cup daily   Works as lawyer   Lives with grandson      Social Drivers  of Health   Financial Resource Strain: Medium Risk (07/27/2021)   Overall Financial Resource Strain (CARDIA)    Difficulty of Paying Living Expenses: Somewhat hard  Food Insecurity: No Food Insecurity (07/27/2021)   Hunger Vital Sign    Worried About Running Out of Food in the Last Year: Never true    Ran Out of Food in the Last Year: Never true  Transportation Needs: No Transportation Needs (07/27/2021)   PRAPARE - Administrator, Civil Service (Medical): No    Lack of Transportation (Non-Medical): No  Physical Activity: Inactive (07/27/2021)   Exercise Vital Sign    Days of Exercise per Week: 0 days    Minutes of Exercise per Session: 0 min  Stress: No Stress Concern Present (07/27/2021)   [employer] of Occupational Health - Occupational Stress Questionnaire    Feeling of Stress : Not at all  Social Connections: Moderately  Isolated (07/27/2021)   Social Connection and Isolation Panel    Frequency of Communication with Friends and Family: Twice a week    Frequency of Social Gatherings with Friends and Family: Once a week    Attends Religious Services: 1 to 4 times per year    Active Member of [employer] or Organizations: No    Attends Banker Meetings: Never    Marital Status: Widowed   Past Surgical History:  Procedure Laterality Date   ANKLE SURGERY Right    as a child   ANTERIOR CERVICAL DECOMP/DISCECTOMY FUSION N/A 02/06/2023   Procedure: Cervical Five-Cervical Six, Cervical Six-Cervical Seven. Anterior Cervical Decompression/Discectomy Fusion;  Surgeon: Debby Dorn MATSU, MD;  Location: [facility] OR;  Service: Neurosurgery;  Laterality: N/A;   APPENDECTOMY     BREAST BIOPSY Right 2015   Fibroadenoma   CHOLECYSTECTOMY     CORONARY STENT INTERVENTION N/A 07/06/2021   Procedure: CORONARY STENT INTERVENTION;  Surgeon: Wonda Sharper, MD;  Location: [facility] INVASIVE CV LAB;  Service: Cardiovascular;  Laterality: N/A;   DRUG INDUCED ENDOSCOPY Bilateral 08/06/2022   Procedure: DRUG INDUCED ENDOSCOPY;  Surgeon: Carlie Clark, MD;  Location: [facility];  Service: ENT;  Laterality: Bilateral;   LEFT HEART CATH AND CORONARY ANGIOGRAPHY N/A 07/06/2021   Procedure: LEFT HEART CATH AND CORONARY ANGIOGRAPHY;  Surgeon: Wonda Sharper, MD;  Location: [facility] INVASIVE CV LAB;  Service: Cardiovascular;  Laterality: N/A;   TONSILLECTOMY     Past Medical History:  Diagnosis Date   CAD (coronary artery disease)    a. s/p NSTEMI in 06/2021 with DES to mid-RCA. Residual disease along D1 and 1st Mrg with medical management recommended.   Essential hypertension    Fibromyalgia    Generalized headaches    History of stroke    Noted incidentally by brain MRI July 2022   Mitral regurgitation    a. moderate to severe by echo in 06/2021   Polycythemia    PVC's (premature ventricular contractions)    Renal  insufficiency    Sleep apnea    Stroke (HCC)    BP 134/84   Pulse (!) 103   Ht 5' 7.5 (1.715 m)   Wt 147 lb (66.7 kg)   SpO2 98%   BMI 22.68 kg/m   Opioid Risk Score:   Fall Risk Score:  `1  Depression screen [facility] 2/9     05/04/2024   11:54 AM 03/08/2024    1:37 PM 01/12/2024    1:19 PM 09/27/2021    1:30 PM 07/27/2021   10:29 AM  Depression screen  PHQ 2/9  Decreased Interest 0 0 0 0 0  Down, Depressed, Hopeless 0 0 0 0 0  PHQ - 2 Score 0 0 0 0 0  Altered sleeping  3 3 0 0  Tired, decreased energy  1 1 3 3   Change in appetite  0 1 0 1  Feeling bad or failure about yourself   0 0 0 0  Trouble concentrating  0 0 0 0  Moving slowly or fidgety/restless  0 0 0 0  Suicidal thoughts  0 0 0 0  PHQ-9 Score  4 5 3 4   Difficult doing work/chores  Somewhat difficult Very difficult Very difficult      Subjective:    Patient ID: Anita Michael, female    DOB: October 12, 1965, 58 y.o.   MRN: [id number]  HPI    Review of Systems  Constitutional:  Positive for appetite change.  Respiratory:  Positive for apnea.   Gastrointestinal:  Positive for constipation and nausea.  Endocrine:       Sometimes low blood sugar  Musculoskeletal:  Positive for back pain, gait problem and myalgias.       Spasms  Neurological:  Positive for dizziness and weakness.  Psychiatric/Behavioral:  Positive for dysphoric mood.   All other systems reviewed and are negative.      Objective:   Physical Exam Gen: no distress, normal appearing HEENT: oral mucosa pink and moist, NCAT Chest: normal effort, normal rate of breathing Abd: soft, non-distended Ext: no edema Psych: pleasant, flat affect Skin: Purple discoloration in her bilateral feet-patient reports this is chronic and she has had multiple negative vascular studies Neuro: Alert and awake, follows commands, cranial nerves II through XII grossly intact, normal speech and language RUE: 4/5 Deltoid, 4/5 Biceps, 4/5 Triceps, 4/5 Wrist Ext, 4/5  Grip LUE: 4/5 Deltoid, 4/5 Biceps, 4/5 Triceps, 4/5 Wrist Ext, 4/5 Grip RLE: HF 4/5, KE 4/5, ADF 3/5, APF 4/5 LLE: HF 4/5, KE 4/5, ADF 3/5, APF 4/5 Sensory exam normal for light touch and pain in all 4 limbs.  No abnormal tone noted Musculoskeletal:   Diffuse tenderness to light palpation in bilateral upper and lower extremities and back. Examination was limited due to patient's report of severe pain. Walking with rolling walker    MRI C spine 11/13/21  IMPRESSION: 1. Degenerative disc osteophyte at C5-6 with resultant severe spinal stenosis, with severe right and moderate left C6 foraminal narrowing. Patchy signal abnormality within the cervical spinal cord at this level consistent with compressive myelomalacia. 2. Right paracentral disc osteophyte at C6-7 with secondary flattening of the right hemi cord, but no visible cord signal changes at this level. Associated severe left C7 foraminal stenosis. 3. Moderate left C4 foraminal stenosis related to uncovertebral and facet disease. 4. Multilevel facet arthrosis throughout the cervical spine as above, most pronounced at C4-5 on the right. Findings could contribute to underlying neck pain.    Assessment & Plan:   1) Fibromyalgia - Patient has widespread pain  2) Denies Depression although mood has been decreased since myotonic dystrophy diagnosed  -Continue Cymbalta  and nortriptyline   3) Sleep disorder, related to the pain   4) Chronic Lower Back pain    5) Chronic Neck Pain  -s/p ACDF C5-7 on 02/06/2023  by Dorn Ned  6) Myotonic Dystrophy, Type 1 -Recently diagnosed by neurology   -Opioid risk tool low -Food for pain discussed,list provided for foods that can be helpful for pain -Discussed low impact progressive exercise -Discussed trying yoga  or tai chi -Continue with physical therapy as tolerated. -Tens Unit recommended, Nexwave ordered prior visit -Advised not to take more tylenol  then 3000mg /day -DC  Robaxin  PRN -Continue cymbalta  60mg  daily -Previously tried nortriptyline  20mg  HS started by neurology  -Pt reported past visit she has used Hemp Cream- advised to discontinue at  the time -Continue UDS and pill counts.  Continue PDMP monitoring.  Pain contract completed prior visit. -Discussed bringing pill bottle with any medications even if empty to all appointments Ordered Journavx - pt reports no benefit  Addundum: called pt to discuss plan -Will DC cymbalta - order 30mg  for 3 weeks to wean down then stop- pt reports this isnt helping -Continue tramadol  50mg  TID PRN current regimen for, can try 100mg  TID PRN after stopping cymbalta . Discussed there is increased risk with tramadol  or opioid medications given her history of myotonic dystrophy and that there is increased risk of respiratory depression and this can be dangerous.  Does appear to be tolerating the tramadol  currently without any side effects. "

## 2024-05-06 ENCOUNTER — Ambulatory Visit (HOSPITAL_COMMUNITY)
Admission: RE | Admit: 2024-05-06 | Discharge: 2024-05-06 | Disposition: A | Discharge status: Home / Self Care | Source: Ambulatory Visit | Attending: Urology | Admitting: Urology

## 2024-05-06 DIAGNOSIS — N281 Cyst of kidney, acquired: Secondary | ICD-10-CM | POA: Diagnosis present

## 2024-05-12 ENCOUNTER — Ambulatory Visit (HOSPITAL_BASED_OUTPATIENT_CLINIC_OR_DEPARTMENT_OTHER): Admitting: Physical Therapy

## 2024-05-12 ENCOUNTER — Encounter (HOSPITAL_BASED_OUTPATIENT_CLINIC_OR_DEPARTMENT_OTHER): Payer: Self-pay | Admitting: Physical Therapy

## 2024-05-12 DIAGNOSIS — M6281 Muscle weakness (generalized): Secondary | ICD-10-CM

## 2024-05-12 DIAGNOSIS — R2681 Unsteadiness on feet: Secondary | ICD-10-CM

## 2024-05-12 DIAGNOSIS — R2689 Other abnormalities of gait and mobility: Secondary | ICD-10-CM

## 2024-05-12 NOTE — Therapy (Signed)
 OUTPATIENT PHYSICAL THERAPY THORACOLUMBAR TREATMENT  Patient Name: Anita Michael MRN: [id number] DOB:May 12, 1966, 57 y.o., female Today's Date: 05/12/2024  END OF SESSION:  PT End of Session - 05/12/24 1203     Visit Number 8    Date for Recertification  06/04/24    Authorization Type [employer]    Authorization Time Period 04/26/24-05/25/24    Authorization - Visit Number 3    Authorization - Number of Visits 4    PT Start Time 1152    PT Stop Time 1230    PT Time Calculation (min) 38 min    Activity Tolerance Patient limited by fatigue;Patient limited by pain    Behavior During Therapy [facility] for tasks assessed/performed         Authorized 7 visits from 02/25/24-04/24/24  Past Medical History:  Diagnosis Date   CAD (coronary artery disease)    a. s/p NSTEMI in 06/2021 with DES to mid-RCA. Residual disease along D1 and 1st Mrg with medical management recommended.   Essential hypertension    Fibromyalgia    Generalized headaches    History of stroke    Noted incidentally by brain MRI July 2022   Mitral regurgitation    a. moderate to severe by echo in 06/2021   Polycythemia    PVC's (premature ventricular contractions)    Renal insufficiency    Sleep apnea    Stroke [facility])    Past Surgical History:  Procedure Laterality Date   ANKLE SURGERY Right    as a child   ANTERIOR CERVICAL DECOMP/DISCECTOMY FUSION N/A 02/06/2023   Procedure: Cervical Five-Cervical Six, Cervical Six-Cervical Seven. Anterior Cervical Decompression/Discectomy Fusion;  Surgeon: Debby Dorn MATSU, MD;  Location: [facility] OR;  Service: Neurosurgery;  Laterality: N/A;   APPENDECTOMY     BREAST BIOPSY Right 2015   Fibroadenoma   CHOLECYSTECTOMY     CORONARY STENT INTERVENTION N/A 07/06/2021   Procedure: CORONARY STENT INTERVENTION;  Surgeon: Wonda Sharper, MD;  Location: [facility] INVASIVE CV LAB;  Service: Cardiovascular;  Laterality: N/A;   DRUG INDUCED ENDOSCOPY Bilateral 08/06/2022   Procedure: DRUG  INDUCED ENDOSCOPY;  Surgeon: Carlie Clark, MD;  Location: [facility];  Service: ENT;  Laterality: Bilateral;   LEFT HEART CATH AND CORONARY ANGIOGRAPHY N/A 07/06/2021   Procedure: LEFT HEART CATH AND CORONARY ANGIOGRAPHY;  Surgeon: Wonda Sharper, MD;  Location: [facility] INVASIVE CV LAB;  Service: Cardiovascular;  Laterality: N/A;   TONSILLECTOMY     Patient Active Problem List   Diagnosis Date Noted   Myotonic muscular dystrophy (HCC) 04/02/2024   Dysphagia 04/02/2024   Weakness 03/01/2024   Muscular dystrophy (HCC) 03/01/2024   Pain 03/01/2024   Capillary disease 02/25/2024   Family history of movement disorder 02/25/2024   Overweight 02/25/2024   Recurrent falls 02/25/2024   Seasonal allergies 02/25/2024   Antiplatelet or antithrombotic long-term use 07/31/2022   BMI 28.0-28.9,adult 07/31/2022   Hemorrhoids 08/21/2021   Constipation 08/21/2021   Herniation of rectum into vagina 08/21/2021   SUI (stress urinary incontinence, female) 08/21/2021   Vaginal pain 08/21/2021   PVC's (premature ventricular contractions) 07/07/2021   Tobacco use 07/06/2021   Mitral valve disorder 07/06/2021   NSTEMI (non-ST elevated myocardial infarction) (HCC) 07/06/2021   Other seasonal allergic rhinitis 05/14/2021   Claustrophobia 09/26/2020   Atrophic vaginitis 09/13/2020   Family history of muscular dystrophy 09/13/2020   Fibromyalgia 09/13/2020   Benign hypertension 09/13/2020   Irregular heart beat 09/13/2020   Mixed hyperlipidemia 09/13/2020   Nephrosclerosis 09/13/2020  Obesity 09/13/2020   Sinusitis 09/13/2020   Vitamin D deficiency 09/13/2020   Difficulty with CPAP full face mask use 06/06/2020   Hepatic steatosis 06/06/2020   Blurry vision 06/03/2020   Difficulty walking 06/03/2020   Dizziness 06/03/2020   Headache disorder 06/03/2020   OSA (obstructive sleep apnea) 03/28/2020   Complex renal cyst 03/16/2020   Tubulovillous adenoma of colon 12/14/2019   Blood on  toilet paper 11/11/2019   Encounter for follow-up examination after completed treatment for conditions other than malignant neoplasm 11/11/2019   Hematochezia 11/11/2019   Family history of cancer 10/19/2019   Polycythemia 10/19/2019   Facial weakness 02/16/2019   Migraine with aura 02/16/2019    PCP: Silvio Ramp MD  REFERRING PROVIDER: Murray Loupe  REFERRING DIAG: M79.7 (ICD-10-CM) - Fibromyalgia   Rationale for Evaluation and Treatment: Rehabilitation  THERAPY DIAG:  Muscle weakness (generalized)  Other abnormalities of gait and mobility  Unsteadiness on feet  ONSET DATE:  fibro since 26; new pain in leg and LB march 2025  SUBJECTIVE:                                                                                                                                                                                           SUBJECTIVE STATEMENT: Pt reports her pain meds were refilled. Energy level is 3/4 - full tank, but pain is 9/10.   From initial evaluation:  Fibromyalgia pain moved into legs and lb hadn't been there before this past March.  Saw neurosurgery but they really didn't find anything.  No MS or spine issues. I am just weak.  Can't open bottles or cans.  Fall often. I've been to PT I n past and it made it worse  PERTINENT HISTORY:  Fibromyalgia falls  PAIN:  Are you having pain? Yes: NPRS scale: current 9/10 Pain location: generalized Pain description: excruciating pain constant Aggravating factors: movement Relieving factors: nothing  PRECAUTIONS: Fall  RED FLAGS: None   WEIGHT BEARING RESTRICTIONS: No  FALLS:  Has patient fallen in last 6 months? Yes. 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  NEXT MD VISIT:   OBJECTIVE:  Note: Objective measures were completed at Evaluation unless otherwise noted.  DIAGNOSTIC FINDINGS:  MRI 6/25 cervical spine  FINDINGS: Seven cervical segments are well visualized. Prior  cervical fusion at C5-6 and C6-7 are again seen with anterior fixation. No hardware failure is noted. Mild neural foraminal narrowing is noted at C4-5 and C5-6 on the right. The odontoid is within normal limits. Facet hypertrophic changes are noted at multiple levels. No soft tissue abnormality is seen.   IMPRESSION: Degenerative and postsurgical changes in the cervical spine. Neural foraminal narrowing is seen as described.  Thoracic MRI 01/09/24 IMPRESSION: Mild disc disease without significant protrusion, foraminal or spinal stenosis. No acute abnormality.   Lumbar MRI 01/09/24 IMPRESSION: No disc protrusion or evidence of impingement.   L4-5 has mild bilateral degenerative foraminal narrowing.   Mild degenerative disc disease throughout.  PATIENT SURVEYS:  LEFS  Extreme difficulty/unable (0), Quite a bit of difficulty (1), Moderate difficulty (2), Little difficulty (3), No difficulty (4) Survey date:    Any of your usual work, housework or school activities   2. Usual hobbies, recreational or sporting activities   3. Getting into/out of the bath   4. Walking between rooms   5. Putting on socks/shoes   6. Squatting    7. Lifting an object, like a bag of groceries from the floor   8. Performing light activities around your home   9. Performing heavy activities around your home   10. Getting into/out of a car   11. Walking 2 blocks   12. Walking 1 mile   13. Going up/down 10 stairs (1 flight)   14. Standing for 1 hour   15.  sitting for 1 hour   16. Running on even ground   17. Running on uneven ground   18. Making sharp turns while running fast   19. Hopping    20.  Rolling over in bed   Score total:  9/80     COGNITION: Overall cognitive status: Within functional limits for tasks assessed     SENSATION: WFL  MUSCLE LENGTH:   POSTURE: rounded shoulders and flexed trunk   PALPATION: No TTP  LUMBAR ROM:  Unable to stand unsupported    LOWER EXTREMITY ROM:     Bilat knee extension limitations due to pain in knees and le  LOWER EXTREMITY MMT:    MMT Right eval Left eval R / L 04/21/24  Hip flexion 3- 3- 3- to 3  Hip extension     Hip abduction 3+ 3+ 3+ /3+  Hip adduction 3+ 3+   Hip internal rotation     Hip external rotation     Knee flexion 3- 3- 3 / 3  Knee extension     Ankle dorsiflexion 3- 3- 3 / 3  Ankle plantarflexion     Ankle inversion     Ankle eversion      (Blank rows = not tested)   FUNCTIONAL TESTS:  Tug: 57.19 using rollator STS from bench heavy use of UE Standing unsupported balance x 5s  GAIT: Distance walked: 400 ft using rollator Assistive device utilized: Walker - 4 wheeled Level of assistance: Modified independence Comments: Pt reports pushing through to make distance without rest but would have preferred a rest period  TREATMENT  [facility] Adult PT Treatment:                                             Date:05/12/24 Pt seen for aquatic therapy today.  Treatment took place in water 3.5-4.75 ft in depth  at the [facility] pool. Temp of water was 91.  Pt entered/exited the pool via chair lift.   - seated in lift chair: alternating LAQ with DF; cycling; hip abdct/ add  - UE on barbell: walking backward and forward 1lap -Rest break  - row motion with barbell x 10 - UE on barbell, side stepping R/L  - attempted to press short hollow noodle under water -> unable to.    - UE on short hollow noodles: walking backwards/ forwards  - UE on wall: alternating hip abdct/ add x 7 each  - 2 hands on short hollow noodle: front tricep press down x 5 (challenge) - wide stance with bil horz abdct/add with  short hollow noodles x 8 - rest break leaning against wall  - squat x 3   Pt requires the buoyancy and hydrostatic pressure of water for support, and to offload joints by unweighting joint load by at least 50 % in navel deep water and by at least 75-80% in chest to neck deep water.  Viscosity of the water is needed for resistance of strengthening. Water current perturbations provides challenge to standing balance requiring increased core activation.    PATIENT EDUCATION:  Education details: aquatic therapy exercise modifications;  Person educated: Patient Education method: Explanation Education comprehension: verbalized understanding  HOME EXERCISE PROGRAM: TBA  ASSESSMENT:  CLINICAL IMPRESSION: Pt tolerated more movement in pool with less rest breaks.  She moves in thoughtful manner, pacing her movement. Pain remained unchanged but pt was able to complete increased volume of exercises compared to previous session.  Pt will continue to benefit from skilled aquatic therapy intervention to improvement strength, reduce/manage pain and progress towards all goals.  Therapist to assess goals - requires authorization for additional visits.       From initial evaluation:  Patient is a 58 y.o. f who was seen today for physical therapy evaluation and treatment for fibromyalgia.  She presents today using Rolator to setting with reports of extreme fatigue.  She has had an unintentional 90lb weight loss in past 4 months with an onset of extreme pain and weakness in LE and LB.  Hx of fibromyalgia with pain sensitivity primarily in shoulders and cervical spine.  She has had multiple diagnostics completed without any new identified dysfunction/etiology for new symptoms.  She is very weak and unable to tolerate most objective and functional testing.  Discussed baseline level of activity required for participation of aquatics therapy (500 ft to and from setting, changing, actual aquatic treatment then  return home) with concern of ultimate toleration.  She VU of need of cg for transport to and from setting in [place] if using. She has agreed to trial and to be seen x 1 a week to begin.  She is a good candidate for aquatic intervention and will benefit from the properties of water to progress towards functional goals if tolerated.   OBJECTIVE IMPAIRMENTS: Abnormal gait, decreased activity tolerance, decreased balance, decreased endurance, decreased mobility, difficulty walking, decreased strength, and pain.   ACTIVITY LIMITATIONS: carrying, lifting, bending, standing, squatting, sleeping, stairs, transfers, bed mobility, dressing, locomotion level, and caring for others  PARTICIPATION LIMITATIONS: meal prep, cleaning, laundry, driving, shopping, community activity, occupation, and yard work  PERSONAL FACTORS: see PmhX are also affecting patient's functional outcome.   REHAB POTENTIAL: Fair to good. Uncertain pt will tolerate activity related to aquatic intervention  CLINICAL DECISION MAKING: Evolving/moderate complexity  EVALUATION COMPLEXITY: Moderate   GOALS: Goals reviewed with patient? Yes  SHORT TERM GOALS: Target date: 03/31/24  Pt will tolerate full aquatic sessions consistently without increase in pain and with improving function to demonstrate good toleration and effectiveness of intervention.  Baseline: Goal status: in progress - 03/31/24; Met 04/21/24    LONG TERM GOALS: Target date: 06/04/24  Pt to improve on LEFS by at least 9 point to demonstrate statistically significant Improvement in function. Baseline: 9/80 Goal status: INITIAL  2.  Pt will improve strength in of LE by 1 grade to demonstrate improved overall physical function Baseline:  Goal status: INITIAL  3.  Pt will tolerate walking to or from setting and engaging in aquatic therapy session without excessive fatigue or increase in pain to demonstrate improved toleration to activity. Baseline: WC transport  Goal  status: In progress 05/12/24  4.  Pt will report a reduction of overall pain by 25% Baseline:  Goal status: in Progress 04/21/24   PLAN:  PT FREQUENCY: 1x/week  PT DURATION:6 weeks  PLANNED INTERVENTIONS: 97164- PT Re-evaluation, 97750- Physical Performance Testing, 97110-Therapeutic exercises, 97530- Therapeutic activity, [id number]- Neuromuscular re-education, 97535- Self Care, 02859- Manual therapy, [id number]- Gait training, [phone]- Aquatic Therapy, [phone]- Electrical stimulation (manual), [phone] (1-2 muscles), 20561 (3+ muscles)- Dry Needling, Patient/Family education, Balance training, Stair training, Taping, Joint mobilization, DME instructions, Cryotherapy, and Moist heat.  PLAN FOR NEXT SESSION: see above  Delon Aquas, PTA 05/12/24 12:33 PM [facility] [address] [place], [place], [address] Phone: [phone]   Fax:  [phone]        For all possible CPT codes, reference the Planned Interventions line above.     Check all conditions that are expected to impact treatment: {Conditions expected to impact treatment:Musculoskeletal disorders, Neurological condition and/or seizures, and Social determinants of health   If treatment provided at initial evaluation, no treatment charged due to lack of authorization.

## 2024-05-18 ENCOUNTER — Ambulatory Visit: Admitting: Adult Health

## 2024-05-18 ENCOUNTER — Encounter: Payer: Self-pay | Admitting: Adult Health

## 2024-05-18 ENCOUNTER — Other Ambulatory Visit (HOSPITAL_COMMUNITY)
Admission: RE | Admit: 2024-05-18 | Discharge: 2024-05-18 | Disposition: A | Discharge status: Home / Self Care | Source: Ambulatory Visit | Attending: Adult Health | Admitting: Adult Health

## 2024-05-18 VITALS — BP 118/77 | HR 84 | Ht 68.0 in | Wt 150.0 lb

## 2024-05-18 DIAGNOSIS — Z1151 Encounter for screening for human papillomavirus (HPV): Secondary | ICD-10-CM | POA: Diagnosis not present

## 2024-05-18 DIAGNOSIS — Z01419 Encounter for gynecological examination (general) (routine) without abnormal findings: Secondary | ICD-10-CM | POA: Diagnosis present

## 2024-05-18 DIAGNOSIS — Z1331 Encounter for screening for depression: Secondary | ICD-10-CM | POA: Diagnosis not present

## 2024-05-18 NOTE — Progress Notes (Signed)
 Patient ID: Anita Michael, female   DOB: 05/01/1966, 58 y.o.   MRN: [id number] History of Present Illness: Char is a 58 year old white female, widowed, PM in for a well woman gyn exam and pap. She has been diagnosed with myotonic muscular dystrophy.  Has stent in heart after MI.    PCP is Dr Orpha     Current Medications, Allergies, Past Medical History, Past Surgical History, Family History and Social History were reviewed in [employer] electronic medical record.     Review of Systems: Patient denies any  hearing loss, fatigue, blurred vision, shortness of breath, abdominal pain, problems urination, or intercourse(not active). No joint pain or mood swings. Has constipation and uses laxative  Has migraines with aura occasionally Has chest pain at times and sees a cardiologist Has pain and weakness, is using walker, she falls often she says  She denies any vaginal bleeding   Physical Exam:BP 118/77 (BP Location: Left Arm, Patient Position: Sitting, Cuff Size: Normal)   Pulse 84   Ht 5' 8 (1.727 m)   Wt 150 lb (68 kg)   BMI 22.81 kg/m   General:  Well developed, well nourished, no acute distress Skin:  Warm and dry Neck:  Midline trachea, normal thyroid , good ROM, no lymphadenopathy, no carotid bruits heard  Lungs; Clear to auscultation bilaterally Breast:  No dominant palpable mass, retraction, or nipple discharge, has epidermal cyst left underarm  Cardiovascular: Regular rate and rhythm Abdomen:  Soft, non tender, no hepatosplenomegaly Pelvic:  External genitalia is normal in appearance, has multiple comedones  The vagina is pale. Urethra has no lesions or masses. The cervix is smooth and stenotic, pap with HR HPV genotyping performed.  Uterus is felt to be normal size, shape, and contour.  No adnexal masses or tenderness noted.Bladder is non tender, no masses felt. Rectal: Deferred  Extremities/musculoskeletal:  No swelling or varicosities noted, no clubbing or  cyanosis Psych:  No mood changes, alert and cooperative,seems happy AA is 0 Fall risk is moderate    05/18/2024   11:03 AM 05/04/2024   11:54 AM 03/08/2024    1:37 PM  Depression screen PHQ 2/9  Decreased Interest 0 0 0  Down, Depressed, Hopeless 0 0 0  PHQ - 2 Score 0 0 0  Altered sleeping 2  3  Tired, decreased energy 2  1  Change in appetite 0  0  Feeling bad or failure about yourself  0  0  Trouble concentrating 0  0  Moving slowly or fidgety/restless 0  0  Suicidal thoughts 0  0  PHQ-9 Score 4  4  Difficult doing work/chores   Somewhat difficult       05/18/2024   11:03 AM 07/27/2021   10:29 AM  GAD 7 : Generalized Anxiety Score  Nervous, Anxious, on Edge 0 0  Control/stop worrying 0 0  Worry too much - different things 0 0  Trouble relaxing 2 1  Restless 0 0  Easily annoyed or irritable 0 0  Afraid - awful might happen 0 0  Total GAD 7 Score 2 1      Upstream - 05/18/24 1100       Pregnancy Intention Screening   Does the patient want to become pregnant in the next year? N/A    Does the patient's partner want to become pregnant in the next year? N/A    Would the patient like to discuss contraceptive options today? N/A      Contraception  Wrap Up   Current Method Abstinence   PM   End Method Abstinence   PM   Contraception Counseling Provided No          Examination chaperoned by Clarita Salt LPN  Impression and plan: 1. Encounter for gynecological examination with Papanicolaou smear of cervix (Primary) Pap sent Pap in 3 years if negative Physical with PCP Labs with PCP and cardiologist  Mammogram was negative 10/08/23 Was having colonoscopy and had to stop due to heart issues  - Cytology - PAP( [facility])

## 2024-05-19 ENCOUNTER — Ambulatory Visit: Payer: Self-pay | Admitting: Adult Health

## 2024-05-19 ENCOUNTER — Ambulatory Visit (HOSPITAL_BASED_OUTPATIENT_CLINIC_OR_DEPARTMENT_OTHER): Admitting: Physical Therapy

## 2024-05-19 LAB — CYTOLOGY - PAP
Adequacy: ABSENT
Comment: NEGATIVE
Diagnosis: NEGATIVE
High risk HPV: NEGATIVE

## 2024-05-20 ENCOUNTER — Telehealth (HOSPITAL_BASED_OUTPATIENT_CLINIC_OR_DEPARTMENT_OTHER): Payer: Self-pay | Admitting: Physical Therapy

## 2024-05-20 ENCOUNTER — Ambulatory Visit (HOSPITAL_BASED_OUTPATIENT_CLINIC_OR_DEPARTMENT_OTHER): Admitting: Physical Therapy

## 2024-05-20 NOTE — Telephone Encounter (Signed)
 Left VM for pt in regards to missed aquatic PT session today.  Pt reminded of NS/cancellation policy and next scheduled visit.  Hopie Iona) Jason Frisbee MPT 05/20/24 12:04 PM [facility] [address] [place], [place], [address] Phone: [phone]   Fax:  [phone]

## 2024-05-22 ENCOUNTER — Other Ambulatory Visit: Payer: Self-pay | Admitting: Physician Assistant

## 2024-05-27 ENCOUNTER — Ambulatory Visit (HOSPITAL_BASED_OUTPATIENT_CLINIC_OR_DEPARTMENT_OTHER): Payer: Self-pay | Attending: Physical Medicine & Rehabilitation | Admitting: Physical Therapy

## 2024-05-27 ENCOUNTER — Encounter (HOSPITAL_BASED_OUTPATIENT_CLINIC_OR_DEPARTMENT_OTHER): Payer: Self-pay | Admitting: Physical Therapy

## 2024-05-27 DIAGNOSIS — R2689 Other abnormalities of gait and mobility: Secondary | ICD-10-CM | POA: Insufficient documentation

## 2024-05-27 DIAGNOSIS — R2681 Unsteadiness on feet: Secondary | ICD-10-CM | POA: Diagnosis present

## 2024-05-27 DIAGNOSIS — M6281 Muscle weakness (generalized): Secondary | ICD-10-CM | POA: Insufficient documentation

## 2024-05-27 NOTE — Therapy (Signed)
 OUTPATIENT PHYSICAL THERAPY THORACOLUMBAR TREATMENT  Patient Name: Anita Michael MRN: [id number] DOB:Sep 05, 1965, 58 y.o., female Today's Date: 05/27/2024  END OF SESSION:  PT End of Session - 05/27/24 1109     Visit Number 9    Date for Recertification  06/04/24    Authorization Type [place] medicaid    Authorization Time Period 04/26/24-05/25/24    Authorization - Visit Number 4    Authorization - Number of Visits 4    PT Start Time 1110    PT Stop Time 1148    PT Time Calculation (min) 38 min    Activity Tolerance Patient limited by fatigue;Patient limited by pain    Behavior During Therapy [facility] for tasks assessed/performed           Past Medical History:  Diagnosis Date   CAD (coronary artery disease)    a. s/p NSTEMI in 06/2021 with DES to mid-RCA. Residual disease along D1 and 1st Mrg with medical management recommended.   Essential hypertension    Fibromyalgia    Generalized headaches    History of stroke    Noted incidentally by brain MRI July 2022   Mitral regurgitation    a. moderate to severe by echo in 06/2021   Myotonic muscular dystrophy (HCC)    Polycythemia    PVC's (premature ventricular contractions)    Renal insufficiency    Sleep apnea    Stroke [facility])    Past Surgical History:  Procedure Laterality Date   ANKLE SURGERY Right    as a child   ANTERIOR CERVICAL DECOMP/DISCECTOMY FUSION N/A 02/06/2023   Procedure: Cervical Five-Cervical Six, Cervical Six-Cervical Seven. Anterior Cervical Decompression/Discectomy Fusion;  Surgeon: Debby Dorn MATSU, MD;  Location: [facility] OR;  Service: Neurosurgery;  Laterality: N/A;   APPENDECTOMY     BREAST BIOPSY Right 2015   Fibroadenoma   CHOLECYSTECTOMY     CORONARY STENT INTERVENTION N/A 07/06/2021   Procedure: CORONARY STENT INTERVENTION;  Surgeon: Wonda Sharper, MD;  Location: [facility] INVASIVE CV LAB;  Service: Cardiovascular;  Laterality: N/A;   DRUG INDUCED ENDOSCOPY Bilateral 08/06/2022   Procedure: DRUG INDUCED  ENDOSCOPY;  Surgeon: Carlie Clark, MD;  Location: [facility];  Service: ENT;  Laterality: Bilateral;   LEFT HEART CATH AND CORONARY ANGIOGRAPHY N/A 07/06/2021   Procedure: LEFT HEART CATH AND CORONARY ANGIOGRAPHY;  Surgeon: Wonda Sharper, MD;  Location: [facility] INVASIVE CV LAB;  Service: Cardiovascular;  Laterality: N/A;   TONSILLECTOMY     Patient Active Problem List   Diagnosis Date Noted   Myotonic muscular dystrophy (HCC) 04/02/2024   Dysphagia 04/02/2024   Weakness 03/01/2024   Muscular dystrophy (HCC) 03/01/2024   Pain 03/01/2024   Capillary disease 02/25/2024   Family history of movement disorder 02/25/2024   Overweight 02/25/2024   Recurrent falls 02/25/2024   Seasonal allergies 02/25/2024   Antiplatelet or antithrombotic long-term use 07/31/2022   BMI 28.0-28.9,adult 07/31/2022   Hemorrhoids 08/21/2021   Constipation 08/21/2021   Herniation of rectum into vagina 08/21/2021   SUI (stress urinary incontinence, female) 08/21/2021   Vaginal pain 08/21/2021   PVC's (premature ventricular contractions) 07/07/2021   Tobacco use 07/06/2021   Mitral valve disorder 07/06/2021   NSTEMI (non-ST elevated myocardial infarction) (HCC) 07/06/2021   Other seasonal allergic rhinitis 05/14/2021   Claustrophobia 09/26/2020   Atrophic vaginitis 09/13/2020   Family history of muscular dystrophy 09/13/2020   Fibromyalgia 09/13/2020   Benign hypertension 09/13/2020   Irregular heart beat 09/13/2020   Mixed hyperlipidemia 09/13/2020  Nephrosclerosis 09/13/2020   Obesity 09/13/2020   Sinusitis 09/13/2020   Vitamin D deficiency 09/13/2020   Difficulty with CPAP full face mask use 06/06/2020   Hepatic steatosis 06/06/2020   Blurry vision 06/03/2020   Difficulty walking 06/03/2020   Dizziness 06/03/2020   Headache disorder 06/03/2020   OSA (obstructive sleep apnea) 03/28/2020   Complex renal cyst 03/16/2020   Tubulovillous adenoma of colon 12/14/2019   Blood on toilet paper  11/11/2019   Encounter for follow-up examination after completed treatment for conditions other than malignant neoplasm 11/11/2019   Hematochezia 11/11/2019   Family history of cancer 10/19/2019   Polycythemia 10/19/2019   Facial weakness 02/16/2019   Migraine with aura 02/16/2019    PCP: Silvio Ramp MD  REFERRING PROVIDER: Murray Loupe  REFERRING DIAG: M79.7 (ICD-10-CM) - Fibromyalgia   Rationale for Evaluation and Treatment: Rehabilitation  THERAPY DIAG:  Muscle weakness (generalized)  Other abnormalities of gait and mobility  Unsteadiness on feet  ONSET DATE:  fibro since 26; new pain in leg and LB march 2025  SUBJECTIVE:                                                                                                                                                                                           SUBJECTIVE STATEMENT: Pt  reports energy level about 50% pain 9/10.   From initial evaluation:  Fibromyalgia pain moved into legs and lb hadn't been there before this past March.  Saw neurosurgery but they really didn't find anything.  No MS or spine issues. I am just weak.  Can't open bottles or cans.  Fall often. I've been to PT I n past and it made it worse  PERTINENT HISTORY:  Fibromyalgia falls  PAIN:  Are you having pain? Yes: NPRS scale: current 9/10 Pain location: generalized Pain description: excruciating pain constant Aggravating factors: movement Relieving factors: nothing  PRECAUTIONS: Fall  RED FLAGS: None   WEIGHT BEARING RESTRICTIONS: No  FALLS:  Has patient fallen in last 6 months? Yes. 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  NEXT MD VISIT:   OBJECTIVE:  Note: Objective measures were completed at Evaluation unless otherwise noted.  DIAGNOSTIC FINDINGS:  MRI 6/25 cervical spine  FINDINGS: Seven cervical segments are well visualized. Prior cervical fusion at C5-6 and  C6-7 are again seen with anterior fixation. No hardware failure is noted. Mild neural foraminal narrowing is noted at C4-5 and C5-6 on the right. The odontoid is within normal limits. Facet hypertrophic changes are noted at multiple levels. No soft tissue abnormality is seen.   IMPRESSION: Degenerative and postsurgical changes in the cervical spine. Neural foraminal narrowing is seen as described.  Thoracic MRI 01/09/24 IMPRESSION: Mild disc disease without significant protrusion, foraminal or spinal stenosis. No acute abnormality.   Lumbar MRI 01/09/24 IMPRESSION: No disc protrusion or evidence of impingement.   L4-5 has mild bilateral degenerative foraminal narrowing.   Mild degenerative disc disease throughout.  PATIENT SURVEYS:  LEFS  Extreme difficulty/unable (0), Quite a bit of difficulty (1), Moderate difficulty (2), Little difficulty (3), No difficulty (4) Survey date:    Any of your usual work, housework or school activities   2. Usual hobbies, recreational or sporting activities   3. Getting into/out of the bath   4. Walking between rooms   5. Putting on socks/shoes   6. Squatting    7. Lifting an object, like a bag of groceries from the floor   8. Performing light activities around your home   9. Performing heavy activities around your home   10. Getting into/out of a car   11. Walking 2 blocks   12. Walking 1 mile   13. Going up/down 10 stairs (1 flight)   14. Standing for 1 hour   15.  sitting for 1 hour   16. Running on even ground   17. Running on uneven ground   18. Making sharp turns while running fast   19. Hopping    20. Rolling over in bed   Score total:  9/80      COGNITION: Overall cognitive status: Within functional limits for tasks assessed     SENSATION: WFL  MUSCLE LENGTH:   POSTURE: rounded shoulders and flexed trunk   PALPATION: No TTP  LUMBAR ROM:  Unable to stand unsupported    LOWER EXTREMITY ROM:     Bilat knee extension limitations due to pain in knees and le  LOWER EXTREMITY MMT:    MMT Right eval Left eval R / L 04/21/24  Hip flexion 3- 3- 3- to 3  Hip extension     Hip abduction 3+ 3+ 3+ /3+  Hip adduction 3+ 3+   Hip internal rotation     Hip external rotation     Knee flexion 3- 3- 3 / 3  Knee extension     Ankle dorsiflexion 3- 3- 3 / 3  Ankle plantarflexion     Ankle inversion     Ankle eversion      (Blank rows = not tested)   FUNCTIONAL TESTS:  Tug: 57.19 using rollator STS from bench heavy use of UE Standing unsupported balance x 5s  GAIT: Distance walked: 400 ft using rollator Assistive device utilized: Walker - 4 wheeled Level of assistance: Modified independence Comments: Pt reports pushing through to make distance without rest but would have preferred a rest period  TREATMENT  [facility] Adult PT Treatment:                                             Date:05/27/24 Pt seen for aquatic therapy today.  Treatment took place in water 3.5-4.75 ft in depth at the [facility] pool.  Temp of water was 91.  Pt entered/exited the pool via chair lift.   - seated in lift chair: alternating LAQ with DF; cycling; hip abdct/ add  - UE on barbell: walking forward 2 laps; backward 2 laps short rest period end of each lap. - side stepping ue support wall 10 ft R/L x 2 laps - row motion with barbell x 10 - UE on barbell, side stepping R/L  - attempted to press short hollow noodle under water -> unable to.    - UE on short hollow noodles: walking backwards/ forwards  - UE on wall: alternating hip abdct/ add x 7 each  - 2 hands on short hollow noodle: front tricep press down x 5 (challenge) - wide  stance with bil horz abdct/add with short hollow noodles x 8 - rest break leaning against wall  - squat x 3   Pt requires the buoyancy and hydrostatic pressure of water for support, and to offload joints by unweighting joint load by at least 50 % in navel deep water and by at least 75-80% in chest to neck deep water.  Viscosity of the water is needed for resistance of strengthening. Water current perturbations provides challenge to standing balance requiring increased core activation.    PATIENT EDUCATION:  Education details: aquatic therapy exercise modifications;  Person educated: Patient Education method: Explanation Education comprehension: verbalized understanding  HOME EXERCISE PROGRAM: TBA  ASSESSMENT:  CLINICAL IMPRESSION: Pt reports reduction in pain by ~ 20% while submerged. Reduction remains for about 30 mins post session. Fatigue level increases requiring a nap when arriving home. Pt demonstrates ability to complete gentle movement patterns of all joints submerged (unable on land) which allows for optimal function for all joints and maintains optimal wellness in chronic disease environment. 85% submersion allows for efficient hydrostatic pressure improving circulation/venous return to heart for improved circulation. She continues to tolerate longer sessions with increased volumes of exercises.  Reports some improvement particularly with showering. ODI goal met. Pt continues to be a good candidate for aquatic therapy intervention to manage chronic disease and improve function as well as QOL.        From initial evaluation:  Patient is a 58 y.o. f who was seen today for physical therapy evaluation and treatment for fibromyalgia.  She presents today using Rolator to setting with reports of extreme fatigue.  She has had an unintentional 90lb weight loss in past 4 months with an onset of extreme pain and weakness in LE and LB.  Hx of fibromyalgia with pain sensitivity primarily  in shoulders and cervical spine.  She has had multiple diagnostics completed without any new identified dysfunction/etiology for new symptoms.  She is very weak and unable to tolerate most objective and functional testing.  Discussed baseline level of activity required for participation of aquatics therapy (500 ft to and from setting, changing, actual aquatic treatment then return home) with concern of ultimate toleration.  She VU of need of cg for transport to and from setting in [place] if using. She has agreed to trial and to be seen x 1 a week to begin.  She is a good candidate for aquatic intervention and will benefit from the properties of water to progress towards functional goals if tolerated.   OBJECTIVE IMPAIRMENTS: Abnormal gait, decreased activity tolerance, decreased balance, decreased endurance, decreased mobility, difficulty walking, decreased strength, and pain.   ACTIVITY LIMITATIONS: carrying, lifting, bending, standing, squatting, sleeping, stairs, transfers, bed mobility, dressing, locomotion level, and caring for  others  PARTICIPATION LIMITATIONS: meal prep, cleaning, laundry, driving, shopping, community activity, occupation, and yard work  PERSONAL FACTORS: see PmhX are also affecting patient's functional outcome.   REHAB POTENTIAL: Fair to good. Uncertain pt will tolerate activity related to aquatic intervention  CLINICAL DECISION MAKING: Evolving/moderate complexity  EVALUATION COMPLEXITY: Moderate   GOALS: Goals reviewed with patient? Yes  SHORT TERM GOALS: Target date: 03/31/24  Pt will tolerate full aquatic sessions consistently without increase in pain and with improving function to demonstrate good toleration and effectiveness of intervention.  Baseline: Goal status: in progress - 03/31/24; Met 04/21/24    LONG TERM GOALS: Target date: 06/04/24  Pt to improve on LEFS by at least 9 point to demonstrate statistically significant Improvement in function. Baseline:  9/80; 18/80 Goal status: Met 05/27/24  2.  Pt will improve strength in of LE by 1 grade to demonstrate improved overall physical function Baseline:  Goal status: In progress 05/27/24  3.  Pt will tolerate walking to or from setting and engaging in aquatic therapy session without excessive fatigue or increase in pain to demonstrate improved toleration to activity. Baseline: WC transport  Goal status: In progress 05/12/24/ 05/27/24  4.  Pt will report a reduction of overall pain by 25% Baseline:  Goal status: in Progress 04/21/24/ 05/27/24   PLAN:  PT FREQUENCY: 1x/week  PT DURATION:6 weeks  PLANNED INTERVENTIONS: 02835- PT Re-evaluation, 97750- Physical Performance Testing, 97110-Therapeutic exercises, 97530- Therapeutic activity, [id number]- Neuromuscular re-education, 97535- Self Care, 02859- Manual therapy, [id number]- Gait training, [phone]- Aquatic Therapy, [phone]- Electrical stimulation (manual), [phone] (1-2 muscles), 20561 (3+ muscles)- Dry Needling, Patient/Family education, Balance training, Stair training, Taping, Joint mobilization, DME instructions, Cryotherapy, and Moist heat.  PLAN FOR NEXT SESSION: see above  Nkechi Linehan) Tiffany Calmes MPT 05/27/24 12:13 PM [facility] [address] [place], [place], [address] Phone: [phone]   Fax:  [phone]         For all possible CPT codes, reference the Planned Interventions line above.     Check all conditions that are expected to impact treatment: {Conditions expected to impact treatment:Musculoskeletal disorders, Neurological condition and/or seizures, and Social determinants of health   If treatment provided at initial evaluation, no treatment charged due to lack of authorization.

## 2024-06-02 ENCOUNTER — Encounter: Payer: Self-pay | Admitting: Cardiology

## 2024-06-02 ENCOUNTER — Ambulatory Visit: Attending: Cardiology | Admitting: Cardiology

## 2024-06-02 VITALS — BP 134/84 | HR 88 | Ht 67.0 in | Wt 150.0 lb

## 2024-06-02 DIAGNOSIS — E782 Mixed hyperlipidemia: Secondary | ICD-10-CM | POA: Diagnosis present

## 2024-06-02 DIAGNOSIS — G7111 Myotonic muscular dystrophy: Secondary | ICD-10-CM | POA: Diagnosis not present

## 2024-06-02 DIAGNOSIS — I25119 Atherosclerotic heart disease of native coronary artery with unspecified angina pectoris: Secondary | ICD-10-CM | POA: Insufficient documentation

## 2024-06-02 DIAGNOSIS — I340 Nonrheumatic mitral (valve) insufficiency: Secondary | ICD-10-CM | POA: Insufficient documentation

## 2024-06-02 NOTE — Patient Instructions (Signed)
 Medication Instructions:  Your physician recommends that you continue on your current medications as directed. Please refer to the Current Medication list given to you today.   Labwork: None today  Testing/Procedures: Your physician has requested that you have an echocardiogram. Echocardiography is a painless test that uses sound waves to create images of your heart. It provides your doctor with information about the size and shape of your heart and how well your heart's chambers and valves are working. This procedure takes approximately one hour. There are no restrictions for this procedure. Please do NOT wear cologne, perfume, aftershave, or lotions (deodorant is allowed). Please arrive 15 minutes prior to your appointment time.  Please note: We ask at that you not bring children with you during ultrasound (echo/ vascular) testing. Due to room size and safety concerns, children are not allowed in the ultrasound rooms during exams. Our front office staff cannot provide observation of children in our lobby area while testing is being conducted. An adult accompanying a patient to their appointment will only be allowed in the ultrasound room at the discretion of the ultrasound technician under special circumstances. We apologize for any inconvenience.   Follow-Up: 6 months  Any Other Special Instructions Will Be Listed Below (If Applicable).  If you need a refill on your cardiac medications before your next appointment, please call your pharmacy.

## 2024-06-02 NOTE — Progress Notes (Signed)
 Cardiology Office Note  Date: 06/02/2024   ID: Anita Michael, DOB [birth year], MRN [id number]  History of Present Illness: Anita Michael is a 58 y.o. female last seen in February by Ms. Dunn PA-C, I reviewed her note.  She is here today with her daughter for a routine visit.  I reviewed the chart.  She has been diagnosed with type I myotonic muscular dystrophy based on genetic testing and neurology evaluation.  Has family history of same.  From a cardiac perspective, she does not report any angina, has a sense of limited palpitations in the evenings, not during the daytime or with activity.  No sudden dizziness or syncope.  No worsening shortness of breath or fluid retention.  Her last echocardiogram was in 2023 at which point LVEF was 50 to 55% with mild diastolic dysfunction, normal RV contraction, and mild mitral regurgitation.  I reviewed her medications which are stable from a cardiac perspective.  LDL was 49 in February.  Physical Exam: VS:  BP 134/84 (BP Location: Right Arm, Cuff Size: Normal)   Pulse 88   Ht 5' 7 (1.702 m)   Wt 150 lb (68 kg)   SpO2 98% Comment: fingers too cold to read  BMI 23.49 kg/m , BMI Body mass index is 23.49 kg/m.  Wt Readings from Last 3 Encounters:  06/02/24 150 lb (68 kg)  05/18/24 150 lb (68 kg)  05/04/24 147 lb (66.7 kg)    General: Patient appears comfortable at rest. HEENT: Conjunctiva and lids normal. Neck: Supple, no elevated JVP or carotid bruits. Lungs: Clear to auscultation, nonlabored breathing at rest. Cardiac: Regular rate and rhythm, no S3 or significant systolic murmur. Extremities: No pitting edema.  ECG:  An ECG dated 09/15/2023 was personally reviewed today and demonstrated:  Sinus rhythm with right bundle branch block.  Labwork: 09/15/2023: ALT 11; AST 12; Hemoglobin 14.9; Platelets 189; TSH 0.815 10/01/2023: BUN 10; Creatinine, Ser 0.75; Magnesium  2.0; Potassium 3.8; Sodium 141     Component Value Date/Time    CHOL 97 09/15/2023 1353   TRIG 78 09/15/2023 1353   HDL 32 (L) 09/15/2023 1353   CHOLHDL 3.0 09/15/2023 1353   VLDL 16 09/15/2023 1353   LDLCALC 49 09/15/2023 1353   Other Studies Reviewed Today:  No interval cardiac testing for review today.  Assessment and Plan:  1.  CAD status post NSTEMI in December 2022 managed with DES to the RCA.  Follow-up Lexiscan  Myoview  in December 2023 was low risk with no evidence of ischemia and LVEF 50%.  She does not report any angina or interval nitroglycerin  use at current level of activity.  Continue aspirin  81 mg daily, Lipitor  80 mg daily, and Zetia  10 mg daily.  2.  Interval diagnosis of type I myotonic muscular dystrophy based on genetic testing and neurology evaluation (significant family history).  We did discuss possible associated cardiac abnormalities (cardiomyopathy, LVH, conduction abnormalities, AF).  She does have limited palpitations in the evenings, no sudden dizziness or syncope.  ECG from February showed right bundle branch block, normal PR interval.  We will plan to update echocardiogram.  She preferred to hold off on cardiac monitoring at this time.   3.  Mitral regurgitation, mild by echocardiogram in March 2023.   4.  Primary hypertension.  5.  Mixed hyperlipidemia, LDL 49 in February.  Continues on Lipitor  80 mg daily and Zetia  10 mg daily.  Disposition:  Follow up 6 months.  Signed, Jayson JUDITHANN Sierras, M.D., F.A.C.C.  [facility]

## 2024-06-03 ENCOUNTER — Ambulatory Visit (HOSPITAL_BASED_OUTPATIENT_CLINIC_OR_DEPARTMENT_OTHER): Payer: Self-pay | Admitting: Physical Therapy

## 2024-06-04 ENCOUNTER — Telehealth: Payer: Self-pay

## 2024-06-04 NOTE — Telephone Encounter (Signed)
 PA for Tramadol  created in Cover My Med.

## 2024-06-04 NOTE — Telephone Encounter (Signed)
 Rx approved 06/04/2024-12/01/2024.

## 2024-06-09 ENCOUNTER — Ambulatory Visit: Payer: Self-pay | Admitting: Cardiology

## 2024-06-09 ENCOUNTER — Ambulatory Visit (HOSPITAL_COMMUNITY)
Admission: RE | Admit: 2024-06-09 | Discharge: 2024-06-09 | Disposition: A | Discharge status: Home / Self Care | Source: Ambulatory Visit | Attending: Cardiology | Admitting: Cardiology

## 2024-06-09 DIAGNOSIS — I340 Nonrheumatic mitral (valve) insufficiency: Secondary | ICD-10-CM | POA: Insufficient documentation

## 2024-06-09 LAB — ECHOCARDIOGRAM COMPLETE
AR max vel: 2.67 cm2
AV Area VTI: 2.59 cm2
AV Area mean vel: 2.65 cm2
AV Mean grad: 2 mmHg
AV Peak grad: 3.9 mmHg
Ao pk vel: 0.99 m/s
Area-P 1/2: 3.48 cm2
S' Lateral: 4.2 cm

## 2024-06-24 ENCOUNTER — Other Ambulatory Visit: Payer: Self-pay | Admitting: Physical Medicine & Rehabilitation

## 2024-06-25 ENCOUNTER — Ambulatory Visit (HOSPITAL_BASED_OUTPATIENT_CLINIC_OR_DEPARTMENT_OTHER): Admitting: Physical Therapy

## 2024-06-30 ENCOUNTER — Encounter (HOSPITAL_BASED_OUTPATIENT_CLINIC_OR_DEPARTMENT_OTHER): Payer: Self-pay | Admitting: Physical Therapy

## 2024-06-30 ENCOUNTER — Ambulatory Visit (HOSPITAL_BASED_OUTPATIENT_CLINIC_OR_DEPARTMENT_OTHER): Payer: Self-pay | Attending: Physical Medicine & Rehabilitation | Admitting: Physical Therapy

## 2024-06-30 DIAGNOSIS — M6281 Muscle weakness (generalized): Secondary | ICD-10-CM | POA: Diagnosis present

## 2024-06-30 DIAGNOSIS — R2689 Other abnormalities of gait and mobility: Secondary | ICD-10-CM

## 2024-06-30 DIAGNOSIS — R2681 Unsteadiness on feet: Secondary | ICD-10-CM | POA: Insufficient documentation

## 2024-06-30 NOTE — Therapy (Signed)
 OUTPATIENT PHYSICAL THERAPY THORACOLUMBAR TREATMENT Progress Note Reporting Period 04/21/24 to 06/30/24  See note below for Objective Data and Assessment of Progress/Goals.     Patient Name: Anita Michael MRN: [id number] DOB:[birth year], 58 y.o., female Today's Date: 06/30/2024  END OF SESSION:  PT End of Session - 06/30/24 1154     Visit Number 10    Date for Recertification  08/27/24    Authorization Type [place] medicaid    Authorization Time Period visit #2/6 visits approved  From 11.06.2025 - 01.04.2026  Tracking #[id number]  MB ([employer])    Authorization - Visit Number 2    Authorization - Number of Visits 6    PT Start Time 1145    PT Stop Time 1225    PT Time Calculation (min) 40 min    Activity Tolerance Patient limited by fatigue;Patient limited by pain    Behavior During Therapy [facility] for tasks assessed/performed           Past Medical History:  Diagnosis Date   CAD (coronary artery disease)    a. s/p NSTEMI in 06/2021 with DES to mid-RCA. Residual disease along D1 and 1st Mrg with medical management recommended.   Essential hypertension    Fibromyalgia    Generalized headaches    History of stroke    Noted incidentally by brain MRI July 2022   Mitral regurgitation    a. moderate to severe by echo in 06/2021   Myotonic muscular dystrophy (HCC)    Polycythemia    PVC's (premature ventricular contractions)    Renal insufficiency    Sleep apnea    Stroke [facility])    Past Surgical History:  Procedure Laterality Date   ANKLE SURGERY Right    as a child   ANTERIOR CERVICAL DECOMP/DISCECTOMY FUSION N/A 02/06/2023   Procedure: Cervical Five-Cervical Six, Cervical Six-Cervical Seven. Anterior Cervical Decompression/Discectomy Fusion;  Surgeon: Debby Dorn MATSU, MD;  Location: [facility] OR;  Service: Neurosurgery;  Laterality: N/A;   APPENDECTOMY     BREAST BIOPSY Right 2015   Fibroadenoma   CHOLECYSTECTOMY     CORONARY STENT INTERVENTION N/A 07/06/2021   Procedure:  CORONARY STENT INTERVENTION;  Surgeon: Wonda Sharper, MD;  Location: [facility] INVASIVE CV LAB;  Service: Cardiovascular;  Laterality: N/A;   DRUG INDUCED ENDOSCOPY Bilateral 08/06/2022   Procedure: DRUG INDUCED ENDOSCOPY;  Surgeon: Carlie Clark, MD;  Location: [facility];  Service: ENT;  Laterality: Bilateral;   LEFT HEART CATH AND CORONARY ANGIOGRAPHY N/A 07/06/2021   Procedure: LEFT HEART CATH AND CORONARY ANGIOGRAPHY;  Surgeon: Wonda Sharper, MD;  Location: [facility] INVASIVE CV LAB;  Service: Cardiovascular;  Laterality: N/A;   TONSILLECTOMY     Patient Active Problem List   Diagnosis Date Noted   Myotonic muscular dystrophy (HCC) 04/02/2024   Dysphagia 04/02/2024   Weakness 03/01/2024   Muscular dystrophy (HCC) 03/01/2024   Pain 03/01/2024   Capillary disease 02/25/2024   Family history of movement disorder 02/25/2024   Overweight 02/25/2024   Recurrent falls 02/25/2024   Seasonal allergies 02/25/2024   Antiplatelet or antithrombotic long-term use 07/31/2022   BMI 28.0-28.9,adult 07/31/2022   Hemorrhoids 08/21/2021   Constipation 08/21/2021   Herniation of rectum into vagina 08/21/2021   SUI (stress urinary incontinence, female) 08/21/2021   Vaginal pain 08/21/2021   PVC's (premature ventricular contractions) 07/07/2021   Tobacco use 07/06/2021   Mitral valve disorder 07/06/2021   NSTEMI (non-ST elevated myocardial infarction) (HCC) 07/06/2021   Other seasonal allergic rhinitis 05/14/2021   Claustrophobia  09/26/2020   Atrophic vaginitis 09/13/2020   Family history of muscular dystrophy 09/13/2020   Fibromyalgia 09/13/2020   Benign hypertension 09/13/2020   Irregular heart beat 09/13/2020   Mixed hyperlipidemia 09/13/2020   Nephrosclerosis 09/13/2020   Obesity 09/13/2020   Sinusitis 09/13/2020   Vitamin D deficiency 09/13/2020   Difficulty with CPAP full face mask use 06/06/2020   Hepatic steatosis 06/06/2020   Blurry vision 06/03/2020   Difficulty walking  06/03/2020   Dizziness 06/03/2020   Headache disorder 06/03/2020   OSA (obstructive sleep apnea) 03/28/2020   Complex renal cyst 03/16/2020   Tubulovillous adenoma of colon 12/14/2019   Blood on toilet paper 11/11/2019   Encounter for follow-up examination after completed treatment for conditions other than malignant neoplasm 11/11/2019   Hematochezia 11/11/2019   Family history of cancer 10/19/2019   Polycythemia 10/19/2019   Facial weakness 02/16/2019   Migraine with aura 02/16/2019    PCP: Silvio Ramp MD  REFERRING PROVIDER: Murray Loupe  REFERRING DIAG: M79.7 (ICD-10-CM) - Fibromyalgia   Rationale for Evaluation and Treatment: Rehabilitation  THERAPY DIAG:  Muscle weakness (generalized)  Other abnormalities of gait and mobility  Unsteadiness on feet  ONSET DATE:  fibro since 26; new pain in leg and LB march 2025  SUBJECTIVE:                                                                                                                                                                                           SUBJECTIVE STATEMENT: Pt pain 5-6/10.  Having a good day   From initial evaluation:  Fibromyalgia pain moved into legs and lb hadn't been there before this past March.  Saw neurosurgery but they really didn't find anything.  No MS or spine issues. I am just weak.  Can't open bottles or cans.  Fall often. I've been to PT I n past and it made it worse  PERTINENT HISTORY:  Fibromyalgia falls  PAIN:  Are you having pain? Yes: NPRS scale: current 9/10 Pain location: generalized Pain description: excruciating pain constant Aggravating factors: movement Relieving factors: nothing  PRECAUTIONS: Fall  RED FLAGS: None   WEIGHT BEARING RESTRICTIONS: No  FALLS:  Has patient fallen in last 6 months? Yes. 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  NEXT MD VISIT:  OBJECTIVE:  Note: Objective measures were completed at Evaluation unless otherwise noted.  DIAGNOSTIC FINDINGS:  MRI 6/25 cervical spine  FINDINGS: Seven cervical segments are well visualized. Prior cervical fusion at C5-6 and C6-7 are again seen with anterior fixation. No hardware failure is noted. Mild neural foraminal narrowing is noted at C4-5 and C5-6 on the right. The odontoid is within normal limits. Facet hypertrophic changes are noted at multiple levels. No soft tissue abnormality is seen.   IMPRESSION: Degenerative and postsurgical changes in the cervical spine. Neural foraminal narrowing is seen as described.  Thoracic MRI 01/09/24 IMPRESSION: Mild disc disease without significant protrusion, foraminal or spinal stenosis. No acute abnormality.   Lumbar MRI 01/09/24 IMPRESSION: No disc protrusion or evidence of impingement.   L4-5 has mild bilateral degenerative foraminal narrowing.   Mild degenerative disc disease throughout.  PATIENT SURVEYS:  LEFS  Extreme difficulty/unable (0), Quite a bit of difficulty (1), Moderate difficulty (2), Little difficulty (3), No difficulty (4) Survey date:    Any of your usual work, housework or school activities   2. Usual hobbies, recreational or sporting activities   3. Getting into/out of the bath   4. Walking between rooms   5. Putting on socks/shoes   6. Squatting    7. Lifting an object, like a bag of groceries from the floor   8. Performing light activities around your home   9. Performing heavy activities around your home   10. Getting into/out of a car   11. Walking 2 blocks   12. Walking 1 mile   13. Going up/down 10 stairs (1 flight)   14. Standing for 1 hour   15.   sitting for 1 hour   16. Running on even ground   17. Running on uneven ground   18. Making sharp turns while running fast   19. Hopping    20. Rolling over in bed   Score total:  9/80   06/30/24: 20/80  COGNITION: Overall cognitive status: Within functional limits for tasks assessed     SENSATION: WFL  MUSCLE LENGTH:   POSTURE: rounded shoulders and flexed trunk   PALPATION: No TTP  LUMBAR ROM:  Unable to stand unsupported    LOWER EXTREMITY ROM:     Bilat knee extension limitations due to pain in knees and le  LOWER EXTREMITY MMT:    MMT Right eval Left eval R / L 04/21/24 R / L 06/30/24  Hip flexion 3- 3- 3- to 3 3  Hip extension      Hip abduction 3+ 3+ 3+ /3+ 4-  Hip adduction 3+ 3+    Hip internal rotation      Hip external rotation      Knee flexion 3- 3- 3 / 3 3 to 3+/5  Knee extension      Ankle dorsiflexion 3- 3- 3 / 3 3+/5  Ankle plantarflexion      Ankle inversion      Ankle eversion       (Blank rows = not tested)   FUNCTIONAL TESTS:  Tug: 57.19 using rollator STS from bench heavy use of UE Standing unsupported balance x 5s  GAIT: Distance walked: 400 ft using rollator Assistive device utilized: Walker - 4 wheeled Level of assistance: Modified independence Comments: Pt reports pushing through to make distance without rest but would have preferred a rest period  TREATMENT  [facility] Adult PT Treatment:  Date:06/30/24 Pt seen for aquatic therapy today.  Treatment took place in water 3.5-4.75 ft in depth at the [facility] pool. Temp of water was 91.  Pt entered/exited the pool via chair lift.   - seated in lift chair: alternating LAQ with DF; cycling; hip abdct/ add  - UE on barbell: walking forward 2 laps; backward 2 laps short rest period end of each lap. - side stepping ue support wall 10 ft R/L x 2 laps - row motion with barbell x 10 - UE on barbell, side stepping R/L  - UE on short  hollow noodles: walking backwards/ forwards  - UE on wall: toe raises; heel raises; high knee marching   Pt requires the buoyancy and hydrostatic pressure of water for support, and to offload joints by unweighting joint load by at least 50 % in navel deep water and by at least 75-80% in chest to neck deep water.  Viscosity of the water is needed for resistance of strengthening. Water current perturbations provides challenge to standing balance requiring increased core activation.    PATIENT EDUCATION:  Education details: aquatic therapy exercise modifications;  Person educated: Patient Education method: Explanation Education comprehension: verbalized understanding  HOME EXERCISE PROGRAM: TBA  ASSESSMENT:  CLINICAL IMPRESSION: Pt reports she is having a good day.  Pain fairly low today. Muscle testing showing improvements as well as her (perception) of functional ability as per LEFS. She is tolerating longer time spent in aquatic session demonstrating improvement activity toleration.  She continue to have a reduction of pain submerged and for some time after.  She will continue to benefit from skilled PT intervention to progress towards all stated goals.    PN:Pt reports reduction in pain by ~ 20% while submerged. Reduction remains for about 30 mins post session. Fatigue level increases requiring a nap when arriving home. Pt demonstrates ability to complete gentle movement patterns of all joints submerged (unable on land) which allows for optimal function for all joints and maintains optimal wellness in chronic disease environment. 85% submersion allows for efficient hydrostatic pressure improving circulation/venous return to heart for improved circulation. She continues to tolerate longer sessions with increased volumes of exercises.  Reports some improvement particularly with showering. ODI goal met. Pt continues to be a good candidate for aquatic therapy intervention to manage chronic  disease and improve function as well as QOL.    OBJECTIVE IMPAIRMENTS: Abnormal gait, decreased activity tolerance, decreased balance, decreased endurance, decreased mobility, difficulty walking, decreased strength, and pain.   ACTIVITY LIMITATIONS: carrying, lifting, bending, standing, squatting, sleeping, stairs, transfers, bed mobility, dressing, locomotion level, and caring for others  PARTICIPATION LIMITATIONS: meal prep, cleaning, laundry, driving, shopping, community activity, occupation, and yard work  PERSONAL FACTORS: see PmhX are also affecting patient's functional outcome.   REHAB POTENTIAL: Fair to good. Uncertain pt will tolerate activity related to aquatic intervention  CLINICAL DECISION MAKING: Evolving/moderate complexity  EVALUATION COMPLEXITY: Moderate   GOALS: Goals reviewed with patient? Yes  SHORT TERM GOALS: Target date: 03/31/24  Pt will tolerate full aquatic sessions consistently without increase in pain and with improving function to demonstrate good toleration and effectiveness of intervention.  Baseline: Goal status: in progress - 03/31/24; Met 04/21/24    LONG TERM GOALS: Target date: 08/28/23  Pt to improve on LEFS by at least 9 point to demonstrate statistically significant Improvement in function. Baseline: 9/80; 18/80; 20/80 Goal status: Met 05/27/24  2.  Pt will improve strength in of LE by 1 grade to  demonstrate improved overall physical function Baseline:  Goal status: In progress 05/27/24  3.  Pt will tolerate walking to or from setting and engaging in aquatic therapy session without excessive fatigue or increase in pain to demonstrate improved toleration to activity. Baseline: WC transport  Goal status: In progress 05/12/24/ 05/27/24  4.  Pt will report a reduction of overall pain by 25% Baseline:  Goal status: in Progress 04/21/24/ 05/27/24   PLAN:  PT FREQUENCY: 1x/week  PT DURATION:8 weeks  PLANNED INTERVENTIONS: 02835- PT  Re-evaluation, 97750- Physical Performance Testing, 97110-Therapeutic exercises, 97530- Therapeutic activity, [id number]- Neuromuscular re-education, 97535- Self Care, 02859- Manual therapy, [id number]- Gait training, [phone]- Aquatic Therapy, [phone]- Electrical stimulation (manual), [phone] (1-2 muscles), [phone] (3+ muscles)- Dry Needling, Patient/Family education, Balance training, Stair training, Taping, Joint mobilization, DME instructions, Cryotherapy, and Moist heat.  PLAN FOR NEXT SESSION: see above  Darcel Zick) Vika Buske MPT 06/30/24 12:51 PM [facility] [address] [place], [place], [address] Phone: [phone]   Fax:  [phone]         For all possible CPT codes, reference the Planned Interventions line above.     Check all conditions that are expected to impact treatment: {Conditions expected to impact treatment:Musculoskeletal disorders, Neurological condition and/or seizures, and Social determinants of health   If treatment provided at initial evaluation, no treatment charged due to lack of authorization.

## 2024-07-06 ENCOUNTER — Ambulatory Visit (HOSPITAL_BASED_OUTPATIENT_CLINIC_OR_DEPARTMENT_OTHER): Admitting: Physical Therapy

## 2024-07-06 ENCOUNTER — Encounter (HOSPITAL_BASED_OUTPATIENT_CLINIC_OR_DEPARTMENT_OTHER): Payer: Self-pay | Admitting: Physical Therapy

## 2024-07-06 DIAGNOSIS — R2689 Other abnormalities of gait and mobility: Secondary | ICD-10-CM

## 2024-07-06 DIAGNOSIS — M6281 Muscle weakness (generalized): Secondary | ICD-10-CM

## 2024-07-06 DIAGNOSIS — R2681 Unsteadiness on feet: Secondary | ICD-10-CM

## 2024-07-06 NOTE — Therapy (Signed)
 OUTPATIENT PHYSICAL THERAPY THORACOLUMBAR TREATMENT   Patient Name: Anita Michael MRN: [id number] DOB:May 13, 1966, 58 y.o., female Today's Date: 07/06/2024  END OF SESSION:  PT End of Session - 07/06/24 1213     Visit Number 11    Date for Recertification  08/27/24    Authorization Type [place]    Authorization Time Period visit #3/6 visits approved  From 11.06.2025 - 01.04.2026  Tracking #[id number]  MB ([employer])    Authorization - Visit Number 3    Authorization - Number of Visits 6    PT Start Time 1200    PT Stop Time 1230    PT Time Calculation (min) 30 min    Activity Tolerance Patient limited by fatigue;Patient limited by pain    Behavior During Therapy [facility] for tasks assessed/performed           Past Medical History:  Diagnosis Date   CAD (coronary artery disease)    a. s/p NSTEMI in 06/2021 with DES to mid-RCA. Residual disease along D1 and 1st Mrg with medical management recommended.   Essential hypertension    Fibromyalgia    Generalized headaches    History of stroke    Noted incidentally by brain MRI July 2022   Mitral regurgitation    a. moderate to severe by echo in 06/2021   Myotonic muscular dystrophy (HCC)    Polycythemia    PVC's (premature ventricular contractions)    Renal insufficiency    Sleep apnea    Stroke [facility])    Past Surgical History:  Procedure Laterality Date   ANKLE SURGERY Right    as a child   ANTERIOR CERVICAL DECOMP/DISCECTOMY FUSION N/A 02/06/2023   Procedure: Cervical Five-Cervical Six, Cervical Six-Cervical Seven. Anterior Cervical Decompression/Discectomy Fusion;  Surgeon: Debby Dorn MATSU, MD;  Location: [facility] OR;  Service: Neurosurgery;  Laterality: N/A;   APPENDECTOMY     BREAST BIOPSY Right 2015   Fibroadenoma   CHOLECYSTECTOMY     CORONARY STENT INTERVENTION N/A 07/06/2021   Procedure: CORONARY STENT INTERVENTION;  Surgeon: Wonda Sharper, MD;  Location: [facility] INVASIVE CV LAB;  Service: Cardiovascular;  Laterality:  N/A;   DRUG INDUCED ENDOSCOPY Bilateral 08/06/2022   Procedure: DRUG INDUCED ENDOSCOPY;  Surgeon: Carlie Clark, MD;  Location: [facility];  Service: ENT;  Laterality: Bilateral;   LEFT HEART CATH AND CORONARY ANGIOGRAPHY N/A 07/06/2021   Procedure: LEFT HEART CATH AND CORONARY ANGIOGRAPHY;  Surgeon: Wonda Sharper, MD;  Location: [facility] INVASIVE CV LAB;  Service: Cardiovascular;  Laterality: N/A;   TONSILLECTOMY     Patient Active Problem List   Diagnosis Date Noted   Myotonic muscular dystrophy (HCC) 04/02/2024   Dysphagia 04/02/2024   Weakness 03/01/2024   Muscular dystrophy (HCC) 03/01/2024   Pain 03/01/2024   Capillary disease 02/25/2024   Family history of movement disorder 02/25/2024   Overweight 02/25/2024   Recurrent falls 02/25/2024   Seasonal allergies 02/25/2024   Antiplatelet or antithrombotic long-term use 07/31/2022   BMI 28.0-28.9,adult 07/31/2022   Hemorrhoids 08/21/2021   Constipation 08/21/2021   Herniation of rectum into vagina 08/21/2021   SUI (stress urinary incontinence, female) 08/21/2021   Vaginal pain 08/21/2021   PVC's (premature ventricular contractions) 07/07/2021   Tobacco use 07/06/2021   Mitral valve disorder 07/06/2021   NSTEMI (non-ST elevated myocardial infarction) (HCC) 07/06/2021   Other seasonal allergic rhinitis 05/14/2021   Claustrophobia 09/26/2020   Atrophic vaginitis 09/13/2020   Family history of muscular dystrophy 09/13/2020   Fibromyalgia 09/13/2020  Benign hypertension 09/13/2020   Irregular heart beat 09/13/2020   Mixed hyperlipidemia 09/13/2020   Nephrosclerosis 09/13/2020   Obesity 09/13/2020   Sinusitis 09/13/2020   Vitamin D deficiency 09/13/2020   Difficulty with CPAP full face mask use 06/06/2020   Hepatic steatosis 06/06/2020   Blurry vision 06/03/2020   Difficulty walking 06/03/2020   Dizziness 06/03/2020   Headache disorder 06/03/2020   OSA (obstructive sleep apnea) 03/28/2020   Complex renal cyst  03/16/2020   Tubulovillous adenoma of colon 12/14/2019   Blood on toilet paper 11/11/2019   Encounter for follow-up examination after completed treatment for conditions other than malignant neoplasm 11/11/2019   Hematochezia 11/11/2019   Family history of cancer 10/19/2019   Polycythemia 10/19/2019   Facial weakness 02/16/2019   Migraine with aura 02/16/2019    PCP: Silvio Ramp MD  REFERRING PROVIDER: Murray Loupe  REFERRING DIAG: M79.7 (ICD-10-CM) - Fibromyalgia   Rationale for Evaluation and Treatment: Rehabilitation  THERAPY DIAG:  Muscle weakness (generalized)  Other abnormalities of gait and mobility  Unsteadiness on feet  ONSET DATE:  fibro since 26; new pain in leg and LB march 2025  SUBJECTIVE:                                                                                                                                                                                           SUBJECTIVE STATEMENT: Pt pain 8/10.  Fatigue level 5/10   From initial evaluation:  Fibromyalgia pain moved into legs and lb hadn't been there before this past March.  Saw neurosurgery but they really didn't find anything.  No MS or spine issues. I am just weak.  Can't open bottles or cans.  Fall often. I've been to PT I n past and it made it worse  PERTINENT HISTORY:  Fibromyalgia falls  PAIN:  Are you having pain? Yes: NPRS scale: current 9/10 Pain location: generalized Pain description: excruciating pain constant Aggravating factors: movement Relieving factors: nothing  PRECAUTIONS: Fall  RED FLAGS: None   WEIGHT BEARING RESTRICTIONS: No  FALLS:  Has patient fallen in last 6 months? Yes. 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  NEXT MD VISIT:   OBJECTIVE:  Note: Objective measures were completed at Evaluation unless otherwise noted.  DIAGNOSTIC FINDINGS:  MRI 6/25 cervical spine  FINDINGS: Seven cervical segments are well visualized. Prior cervical fusion at C5-6 and C6-7 are again seen with anterior fixation. No hardware failure is noted. Mild neural foraminal narrowing is noted at C4-5 and C5-6 on the right. The odontoid is within normal limits. Facet hypertrophic changes are noted at multiple levels. No soft tissue abnormality is seen.   IMPRESSION: Degenerative and postsurgical changes in the cervical spine. Neural foraminal narrowing is seen as described.  Thoracic MRI 01/09/24 IMPRESSION: Mild disc disease without significant protrusion, foraminal or spinal stenosis. No acute abnormality.   Lumbar MRI 01/09/24 IMPRESSION: No disc protrusion or evidence of impingement.   L4-5 has mild bilateral degenerative foraminal narrowing.   Mild degenerative disc disease throughout.  PATIENT SURVEYS:  LEFS  Extreme difficulty/unable (0), Quite a bit of difficulty (1), Moderate difficulty (2), Little difficulty (3), No difficulty (4) Survey date:    Any of your usual work, housework or school activities   2. Usual hobbies, recreational or sporting activities   3. Getting into/out of the bath   4. Walking between rooms   5. Putting on socks/shoes   6. Squatting    7. Lifting an object, like a bag of groceries from the floor   8. Performing light activities around your home   9. Performing heavy activities around your home   10. Getting into/out of a car   11. Walking 2 blocks   12. Walking 1 mile   13. Going up/down 10 stairs (1 flight)   14. Standing for 1 hour   15.  sitting for 1 hour   16. Running on even ground   17. Running on uneven ground   18. Making sharp turns while running fast   19. Hopping     20. Rolling over in bed   Score total:  9/80   06/30/24: 20/80  COGNITION: Overall cognitive status: Within functional limits for tasks assessed     SENSATION: WFL  MUSCLE LENGTH:   POSTURE: rounded shoulders and flexed trunk   PALPATION: No TTP  LUMBAR ROM:  Unable to stand unsupported    LOWER EXTREMITY ROM:     Bilat knee extension limitations due to pain in knees and le  LOWER EXTREMITY MMT:    MMT Right eval Left eval R / L 04/21/24 R / L 06/30/24  Hip flexion 3- 3- 3- to 3 3  Hip extension      Hip abduction 3+ 3+ 3+ /3+ 4-  Hip adduction 3+ 3+    Hip internal rotation      Hip external rotation      Knee flexion 3- 3- 3 / 3 3 to 3+/5  Knee extension      Ankle dorsiflexion 3- 3- 3 / 3 3+/5  Ankle plantarflexion      Ankle inversion      Ankle eversion       (Blank rows = not tested)   FUNCTIONAL TESTS:  Tug: 57.19 using rollator STS from bench heavy use of UE Standing unsupported balance x 5s  GAIT: Distance walked: 400 ft using rollator Assistive device utilized: Walker - 4 wheeled Level of assistance: Modified independence Comments: Pt reports pushing through to make distance without rest but would have preferred a rest period  TREATMENT  [facility] Adult PT Treatment:  Date:07/06/24 Pt seen for aquatic therapy today.  Treatment took place in water 3.5-4.75 ft in depth at the [facility] pool. Temp of water was 91.  Pt entered/exited the pool via chair lift.   - seated in lift chair: alternating LAQ with DF; cycling; hip abdct/ add  - UE on barbell: walking forward 2 laps; backward 2 laps short rest period end of each lap. - side stepping ue support barbell R/L x 2 laps - UE on wall: toe raises; heel raises; high knee marching; hip add/abd - Standing short hollow noodles: walking backwards/ forwards (difficult)   Pt requires the buoyancy and hydrostatic pressure of water for support, and to  offload joints by unweighting joint load by at least 50 % in navel deep water and by at least 75-80% in chest to neck deep water.  Viscosity of the water is needed for resistance of strengthening. Water current perturbations provides challenge to standing balance requiring increased core activation.    PATIENT EDUCATION:  Education details: aquatic therapy exercise modifications;  Person educated: Patient Education method: Explanation Education comprehension: verbalized understanding  HOME EXERCISE PROGRAM: TBA  ASSESSMENT:  CLINICAL IMPRESSION: Good toleration of treatment today requiring fewer rest periods. Her walking submerged and on deck is fluid and not guarded.  No reports of increased pain or excessive fatigue.  Progressing well.  PN:Pt reports she is having a good day.  Pain fairly low today. Muscle testing showing improvements as well as her (perception) of functional ability as per LEFS. She is tolerating longer time spent in aquatic session demonstrating improvement activity toleration.  She continue to have a reduction of pain submerged and for some time after.  She will continue to benefit from skilled PT intervention to progress towards all stated goals.    PN:Pt reports reduction in pain by ~ 20% while submerged. Reduction remains for about 30 mins post session. Fatigue level increases requiring a nap when arriving home. Pt demonstrates ability to complete gentle movement patterns of all joints submerged (unable on land) which allows for optimal function for all joints and maintains optimal wellness in chronic disease environment. 85% submersion allows for efficient hydrostatic pressure improving circulation/venous return to heart for improved circulation. She continues to tolerate longer sessions with increased volumes of exercises.  Reports some improvement particularly with showering. ODI goal met. Pt continues to be a good candidate for aquatic therapy intervention to  manage chronic disease and improve function as well as QOL.    OBJECTIVE IMPAIRMENTS: Abnormal gait, decreased activity tolerance, decreased balance, decreased endurance, decreased mobility, difficulty walking, decreased strength, and pain.   ACTIVITY LIMITATIONS: carrying, lifting, bending, standing, squatting, sleeping, stairs, transfers, bed mobility, dressing, locomotion level, and caring for others  PARTICIPATION LIMITATIONS: meal prep, cleaning, laundry, driving, shopping, community activity, occupation, and yard work  PERSONAL FACTORS: see PmhX are also affecting patient's functional outcome.   REHAB POTENTIAL: Fair to good. Uncertain pt will tolerate activity related to aquatic intervention  CLINICAL DECISION MAKING: Evolving/moderate complexity  EVALUATION COMPLEXITY: Moderate   GOALS: Goals reviewed with patient? Yes  SHORT TERM GOALS: Target date: 03/31/24  Pt will tolerate full aquatic sessions consistently without increase in pain and with improving function to demonstrate good toleration and effectiveness of intervention.  Baseline: Goal status: in progress - 03/31/24; Met 04/21/24    LONG TERM GOALS: Target date: 08/28/23  Pt to improve on LEFS by at least 9 point to demonstrate statistically significant Improvement in function. Baseline: 9/80; 18/80; 20/80 Goal  status: Met 05/27/24  2.  Pt will improve strength in of LE by 1 grade to demonstrate improved overall physical function Baseline:  Goal status: In progress 05/27/24  3.  Pt will tolerate walking to or from setting and engaging in aquatic therapy session without excessive fatigue or increase in pain to demonstrate improved toleration to activity. Baseline: WC transport  Goal status: In progress 05/12/24/ 05/27/24  4.  Pt will report a reduction of overall pain by 25% Baseline:  Goal status: in Progress 04/21/24/ 05/27/24   PLAN:  PT FREQUENCY: 1x/week  PT DURATION:8 weeks  PLANNED INTERVENTIONS:  02835- PT Re-evaluation, 97750- Physical Performance Testing, 97110-Therapeutic exercises, 97530- Therapeutic activity, [id number]- Neuromuscular re-education, 97535- Self Care, [id number]- Manual therapy, [id number]- Gait training, [phone]- Aquatic Therapy, [phone]- Electrical stimulation (manual), [phone] (1-2 muscles), [id number] (3+ muscles)- Dry Needling, Patient/Family education, Balance training, Stair training, Taping, Joint mobilization, DME instructions, Cryotherapy, and Moist heat.  PLAN FOR NEXT SESSION: see above  Arlo Buffone) Eternity Dexter MPT 07/06/2024 12:34 PM [facility] [address] [place], [place], [address] Phone: [phone]   Fax:  [phone]         For all possible CPT codes, reference the Planned Interventions line above.     Check all conditions that are expected to impact treatment: {Conditions expected to impact treatment:Musculoskeletal disorders, Neurological condition and/or seizures, and Social determinants of health   If treatment provided at initial evaluation, no treatment charged due to lack of authorization.

## 2024-07-19 ENCOUNTER — Ambulatory Visit (HOSPITAL_BASED_OUTPATIENT_CLINIC_OR_DEPARTMENT_OTHER): Payer: Self-pay | Admitting: Physical Therapy

## 2024-08-03 ENCOUNTER — Encounter (INDEPENDENT_AMBULATORY_CARE_PROVIDER_SITE_OTHER): Payer: Self-pay | Admitting: *Deleted

## 2024-08-05 ENCOUNTER — Encounter: Admitting: Physical Medicine & Rehabilitation

## 2024-08-19 ENCOUNTER — Encounter: Payer: Self-pay | Admitting: Physical Medicine & Rehabilitation

## 2024-08-19 ENCOUNTER — Encounter: Attending: Physical Medicine & Rehabilitation | Admitting: Physical Medicine & Rehabilitation

## 2024-08-19 VITALS — BP 103/69 | HR 87 | Ht 67.0 in | Wt 148.0 lb

## 2024-08-19 DIAGNOSIS — G894 Chronic pain syndrome: Secondary | ICD-10-CM | POA: Insufficient documentation

## 2024-08-19 DIAGNOSIS — G7111 Myotonic muscular dystrophy: Secondary | ICD-10-CM | POA: Insufficient documentation

## 2024-08-19 DIAGNOSIS — Z5181 Encounter for therapeutic drug level monitoring: Secondary | ICD-10-CM | POA: Diagnosis present

## 2024-08-19 DIAGNOSIS — Z79899 Other long term (current) drug therapy: Secondary | ICD-10-CM | POA: Diagnosis present

## 2024-08-19 MED ORDER — HYDROCODONE-ACETAMINOPHEN 5-325 MG PO TABS: 0.5000 | ORAL_TABLET | Freq: Three times a day (TID) | ORAL | 0 refills | Status: DC | PRN

## 2024-08-19 MED ORDER — SUZETRIGINE 50 MG PO TABS: 50.0000 mg | ORAL_TABLET | Freq: Two times a day (BID) | ORAL | 0 refills | Status: DC | PRN

## 2024-08-19 MED ORDER — NALOXONE HCL 4 MG/0.1ML NA LIQD: NASAL | 1 refills | Status: AC

## 2024-08-19 NOTE — Progress Notes (Signed)
 " HPI  Anita Michael is a 59 y.o. year old female  who  has a past medical history of CAD (coronary artery disease), Essential hypertension, Fibromyalgia, Generalized headaches, History of stroke, Mitral regurgitation, Myotonic muscular dystrophy (HCC), Polycythemia, PVC's (premature ventricular contractions), Renal insufficiency, Sleep apnea, and Stroke (HCC).   They are presenting to PM&R clinic as a new patient for pain management evaluation. They were referred by Silvio Ramp, NP for treatment of chronic bodywide pain pain.  Thank you for referral of this patient.  Patient is here with her daughter.  She reports that she has long hx of fibromyalgia, being diagnosed with this condition when she was 26.   Patient says for a long time that her issues with pain were primarily in her shoulders, arms and neck.  Around March 2025 her pain became worse and spread to her lower back and legs.  She reports feeling weak throughout her body and had several falls, now walking with a rolling walker.  Patient reports she is walking with much better stability since she has started using the rolling walker.  Her pain is particularly severe at night.  She was previously on Cymbalta  for pain, this medication was restarted last month.  She is not sure how much Cymbalta  is helping.  She feels like she is tolerating Cymbalta  overall but it does occasionally cause mild nausea.    She had MRI of L spine, C spine and T spine completed a few days ago- results pending.  This study was ordered by neurosurgery.   Patient reports she was previously doing some light exercises but she stopped this in April due to the pain.  Patient had a ACDF C5-7 on 02/06/2023  by Dorn Ned for cervical myelopathy.  She reports that her upper extremity pain and strength did improve after the surgery.  Denies bowel or bladder incontinence.  Patient has used tramadol   which did take the edge off her pain.  Patient worked with physical  therapy last year, reports this greatly worsened her pain after doing it.  She did try aquatic therapy in the past and found this to be helpful.   Reports she saw rheumatology in the past who felt fibromyalgia was likely diagnosis.  Red flag symptoms: No red flags for back pain endorsed in Hx or ROS  Medications tried: Topical medications Aspercream helps a little  Nsaids -  not recently  Tylenol   - Tylenol - helps slightly  Opiates - Tramadol - helped a little  Gabapentin  - swelling Lyrica - didn't help , put me in [place] TCAs - Does not recall  SNRIs - Taking cymbalta - some nausea with it  Robaxin  helped a little  Other treatments: PT- Last year after surgery, caused a lot of pain. Pool therapy helped in the past.  TENs unit- denies Injections- denies Surgery - C spine surgery in 2024 as above    Prior UDS results: No results found for: LABOPIA, COCAINSCRNUR, LABBENZ, AMPHETMU, THCU, LABBARB   Interval History 03/08/24 Reports pain all over body, present pretty much all the time. Tramadol   provides variable relief - sometimes works a little, sometimes doesn't seem to work as well. Pain levels range from lowest of 7 when medication effective to above 10 when medication ineffective. Tramadol  duration 4-6 hours when working.  Continues Cymbalta , recently increased to 60mg  from 30mg  by neurology. Still experiencing nausea from Cymbalta  but reports able to tolerate medication and now able to eat. Uncertain about pain relief benefit from  Cymbalta .  Sleep remains poor. Recently started seeing neurologist who prescribed sleep medication (nortriptyline , 2 capsules at night) approximately 1 week ago. No improvement in pain noted from nortriptyline . She is sleeping better.   Discontinued Robaxin  (methocarbamol ) muscle relaxer - not using much.  Physical therapy scheduled to start 03/22/2024 (aquatic therapy).  Performs seated exercises including arm movements and  getting up/down from chair.  Neurology evaluating for myotonic muscular dystrophy   Interval History 05/04/24 Reports significant pain since running out of Tramadol  medication on Friday. The medication was refilled yesterday and is ready for pickup. Prior to running out, the medication provided reasonable pain control, lasting long enough between doses generally.   Reports recent diagnosis from neurology: Type 1 Myotonic Muscular Dystrophy, confirmed genetically. Pt says neurologist (Dr. Onita) advised against treatment options due to potential cardiac effects, given PMHx of heart attack and strokes. There is a significant family history of myotonic dystrophy. Pt believes the condition may have been activated by other health stressors.  Complains of worsening weakness, difficulty with stairs. Also reports constant pain throughout her body, related to fibromyalgia.  Reports feeling constantly cold.  Denies breathing difficulty but feels like a cold is starting.  Reports gastrointestinal upset, including poor appetite, nausea with the smell of food, and constant gas. This has been ongoing for some time and is not new. Bowel movements are regular. Unsure if it is related to a new, unnamed medication. Plans to discuss with the prescribing doctor at an upcoming appointment.  Lives with 75 year old grandson who provides significant help. Oldest son also visits daily to assist.  Physical therapy with aquatics helps temporarily in the warm water, but pain returns after.  Interval History 08/19/24 Reports worsening, constant pain. Tramadol  pain medication is not providing relief as well as it did the pasts. Reports difficulty sleeping. No dizziness, headache, or weakness reported. No breathing problems. Reports generalized muscle soreness, particularly in the proximal muscles of arms, shoulders, hips, and thighs. Hands and lower back are also painful.  Past Medical History - Myotonic dystrophy - History  of  heart disease - History of one kidney being smaller, but no chronic kidney disease  Medications - Tramadol : Reports decreased efficacy. Was previously helpful until approximately December. - Cymbalta : Continues to take this. - Nortriptyline : Prescribed by primary care for sleep.  Pain Inventory Average Pain 10 Pain Right Now 10 My pain is constant and hurting  In the last 24 hours, has pain interfered with the following? General activity 10 Relation with others 0 Enjoyment of life 10 What TIME of day is your pain at its worst? morning , daytime, evening, and night Sleep (in general) Poor  Pain is worse with: walking, bending, sitting, inactivity, standing, and some activites Pain improves with: medication Relief from Meds: 0      Family History  Problem Relation Age of Onset   Breast cancer Maternal Grandmother 45   Muscular dystrophy Father    Breast cancer Mother 10   Multiple sclerosis Mother    Multiple sclerosis Sister    Breast cancer Sister 38   Diabetes Sister    Breast cancer Sister 20   Muscular dystrophy Sister    Other Daughter        gastroparises; intestinal lymphangiectasia   Diabetes Daughter    Liver disease Daughter    Arrhythmia Daughter    Congestive Heart Failure Daughter    COPD Daughter    Other Son        nerve disease  Other Son        MMD; has a pacemaker   Other Son        MMD   Social History   Socioeconomic History   Marital status: Widowed    Spouse name: Not on file   Number of children: 6   Years of education: Not on file   Highest education level: Not on file  Occupational History   Not on file  Tobacco Use   Smoking status: Every Day    Current packs/day: 0.25    Average packs/day: 0.3 packs/day for 2.1 years (0.5 ttl pk-yrs)    Types: Cigarettes    Start date: 2024    Last attempt to quit: 02/07/2019   Smokeless tobacco: Never   Tobacco comments:    1 cigarette a day   Vaping Use   Vaping status: Never  Used  Substance and Sexual Activity   Alcohol use: Yes    Comment: 1x a year   Drug use: No   Sexual activity: Not Currently    Birth control/protection: Post-menopausal  Other Topics Concern   Not on file  Social History Narrative   Right handed   Caffeine- 1 cup daily   Works as lawyer   Lives with grandson      Social Drivers of Health   Tobacco Use: High Risk (07/06/2024)   Patient History    Smoking Tobacco Use: Every Day    Smokeless Tobacco Use: Never    Passive Exposure: Not on file  Financial Resource Strain: Low Risk (05/18/2024)   Overall Financial Resource Strain (CARDIA)    Difficulty of Paying Living Expenses: Not very hard  Food Insecurity: No Food Insecurity (05/18/2024)   Epic    Worried About Radiation Protection Practitioner of Food in the Last Year: Never true    Ran Out of Food in the Last Year: Never true  Transportation Needs: No Transportation Needs (05/18/2024)   Epic    Lack of Transportation (Medical): No    Lack of Transportation (Non-Medical): No  Physical Activity: Inactive (05/18/2024)   Exercise Vital Sign    Days of Exercise per Week: 0 days    Minutes of Exercise per Session: 20 min  Stress: No Stress Concern Present (05/18/2024)   [employer] of Occupational Health - Occupational Stress Questionnaire    Feeling of Stress: Not at all  Social Connections: Moderately Isolated (05/18/2024)   Social Connection and Isolation Panel    Frequency of Communication with Friends and Family: Twice a week    Frequency of Social Gatherings with Friends and Family: Once a week    Attends Religious Services: 1 to 4 times per year    Active Member of [employer] or Organizations: No    Attends Banker Meetings: Never    Marital Status: Widowed  Depression (PHQ2-9): Low Risk (05/18/2024)   Depression (PHQ2-9)    PHQ-2 Score: 4  Alcohol Screen: Low Risk (05/18/2024)   Alcohol Screen    Last Alcohol Screening Score (AUDIT): 0  Housing: Low Risk  (05/18/2024)   Epic    Unable to Pay for Housing in the Last Year: No    Number of Times Moved in the Last Year: 0    Homeless in the Last Year: No  Utilities: Not At Risk (05/18/2024)   Epic    Threatened with loss of utilities: No  Health Literacy: Not on file   Past Surgical History:  Procedure Laterality Date   ANKLE SURGERY Right  as a child   ANTERIOR CERVICAL DECOMP/DISCECTOMY FUSION N/A 02/06/2023   Procedure: Cervical Five-Cervical Six, Cervical Six-Cervical Seven. Anterior Cervical Decompression/Discectomy Fusion;  Surgeon: Debby Dorn MATSU, MD;  Location: [facility];  Service: Neurosurgery;  Laterality: N/A;   APPENDECTOMY     BREAST BIOPSY Right 2015   Fibroadenoma   CHOLECYSTECTOMY     CORONARY STENT INTERVENTION N/A 07/06/2021   Procedure: CORONARY STENT INTERVENTION;  Surgeon: Wonda Sharper, MD;  Location: [facility] INVASIVE CV LAB;  Service: Cardiovascular;  Laterality: N/A;   DRUG INDUCED ENDOSCOPY Bilateral 08/06/2022   Procedure: DRUG INDUCED ENDOSCOPY;  Surgeon: Carlie Clark, MD;  Location: [facility];  Service: ENT;  Laterality: Bilateral;   LEFT HEART CATH AND CORONARY ANGIOGRAPHY N/A 07/06/2021   Procedure: LEFT HEART CATH AND CORONARY ANGIOGRAPHY;  Surgeon: Wonda Sharper, MD;  Location: [facility] INVASIVE CV LAB;  Service: Cardiovascular;  Laterality: N/A;   TONSILLECTOMY     Past Medical History:  Diagnosis Date   CAD (coronary artery disease)    a. s/p NSTEMI in 06/2021 with DES to mid-RCA. Residual disease along D1 and 1st Mrg with medical management recommended.   Essential hypertension    Fibromyalgia    Generalized headaches    History of stroke    Noted incidentally by brain MRI July 2022   Mitral regurgitation    a. moderate to severe by echo in 06/2021   Myotonic muscular dystrophy (HCC)    Polycythemia    PVC's (premature ventricular contractions)    Renal insufficiency    Sleep apnea    Stroke (HCC)    There were no vitals taken  for this visit.  Opioid Risk Score:   Fall Risk Score:  `1  Depression screen [facility] 2/9     05/18/2024   11:03 AM 05/04/2024   11:54 AM 03/08/2024    1:37 PM 01/12/2024    1:19 PM 09/27/2021    1:30 PM 07/27/2021   10:29 AM  Depression screen PHQ 2/9  Decreased Interest 0 0 0 0 0 0  Down, Depressed, Hopeless 0 0 0 0 0 0  PHQ - 2 Score 0 0 0 0 0 0  Altered sleeping 2  3 3  0 0  Tired, decreased energy 2  1 1 3 3   Change in appetite 0  0 1 0 1  Feeling bad or failure about yourself  0  0 0 0 0  Trouble concentrating 0  0 0 0 0  Moving slowly or fidgety/restless 0  0 0 0 0  Suicidal thoughts 0  0 0 0 0  PHQ-9 Score 4   4  5  3  4    Difficult doing work/chores   Somewhat difficult Very difficult Very difficult      Data saved with a previous flowsheet row definition     Subjective:    Patient ID: Anita Michael, female    DOB: 1965-11-26, 59 y.o.   MRN: [id number]  HPI    Review of Systems  Constitutional:  Positive for appetite change.  Respiratory:  Positive for apnea.   Gastrointestinal:  Positive for constipation and nausea.  Endocrine:       Sometimes low blood sugar  Musculoskeletal:  Positive for back pain, gait problem and myalgias.       Spasms  Neurological:  Positive for dizziness and weakness.  Psychiatric/Behavioral:  Positive for dysphoric mood.   All other systems reviewed and are negative.      Objective:  Physical Exam Gen: no distress, normal appearing HEENT: oral mucosa pink and moist, NCAT Chest: normal effort, normal rate of breathing Abd: soft, non-distended Ext: no edema Psych: pleasant, flat affect Skin: Purple discoloration in her bilateral feet-patient reports this is chronic and she has had multiple negative vascular studies Neuro: Alert and awake, follows commands, cranial nerves II through XII grossly intact, normal speech and language Strength 4/5 in b/l UE and LE  Sensory exam normal for light touch and pain in all 4 limbs.  No  abnormal tone noted Musculoskeletal:   Diffuse tenderness to light palpation in bilateral upper and lower extremities , lower back and neck.   Walking with rolling walker    MRI C spine 11/13/21  IMPRESSION: 1. Degenerative disc osteophyte at C5-6 with resultant severe spinal stenosis, with severe right and moderate left C6 foraminal narrowing. Patchy signal abnormality within the cervical spinal cord at this level consistent with compressive myelomalacia. 2. Right paracentral disc osteophyte at C6-7 with secondary flattening of the right hemi cord, but no visible cord signal changes at this level. Associated severe left C7 foraminal stenosis. 3. Moderate left C4 foraminal stenosis related to uncovertebral and facet disease. 4. Multilevel facet arthrosis throughout the cervical spine as above, most pronounced at C4-5 on the right. Findings could contribute to underlying neck pain.    Assessment & Plan:   1) Fibromyalgia - Patient has widespread pain  2) Denies Depression although mood has been decreased since myotonic dystrophy diagnosed  -Continue Cymbalta  and nortriptyline   3) Sleep disorder, related to the pain   4) Chronic Lower Back pain    5) Chronic Neck Pain  -s/p ACDF C5-7 on 02/06/2023  by Dorn Ned  6) Myotonic Dystrophy, Type 1 -Recently diagnosed by neurology   -Opioid risk tool low -Food for pain discussed,list provided for foods that can be helpful for pain -Discussed low impact progressive exercise -Discussed trying yoga or tai chi -Continue with physical therapy as tolerated. -Tens Unit recommended, Nexwave ordered prior visit -Advised not to take more tylenol  then 3000mg /day -DC Robaxin  PRN -Continue cymbalta  60mg  daily -She is on nortriptyline  20mg  HS started by neurology  -Pt reported past visit she has used Hemp Cream- advised to discontinue at  the time -Continue UDS and pill counts.  Continue PDMP monitoring.  Pain contract completed  prior visit. -Discussed bringing pill bottle with any medications even if empty to all appointments -Prior use tramadol  50mg  TID PRN, recently less effective. Caution to increase due to use of cymbalta  and TCA.  -Consider Hydrocodone  5 mg prescribed, half tablet up to three times a day. Cautioned about risks of opioid use with myotonic dystrophy, including potential for respiratory depression and weakness.  Would like to review further/discuss with neurology.  -For current acute pain worsening will order Journavx , will send coupon card.   "

## 2024-08-20 ENCOUNTER — Ambulatory Visit: Admitting: Urology

## 2024-08-20 ENCOUNTER — Telehealth: Payer: Self-pay

## 2024-08-20 ENCOUNTER — Encounter: Payer: Self-pay | Admitting: Urology

## 2024-08-20 VITALS — BP 117/79 | HR 82

## 2024-08-20 DIAGNOSIS — N281 Cyst of kidney, acquired: Secondary | ICD-10-CM

## 2024-08-20 DIAGNOSIS — N3281 Overactive bladder: Secondary | ICD-10-CM

## 2024-08-20 LAB — MICROSCOPIC EXAMINATION

## 2024-08-20 LAB — URINALYSIS, ROUTINE W REFLEX MICROSCOPIC
Glucose, UA: NEGATIVE
Ketones, UA: NEGATIVE
Nitrite, UA: NEGATIVE
Protein,UA: NEGATIVE
RBC, UA: NEGATIVE
Specific Gravity, UA: 1.02 (ref 1.005–1.030)
Urobilinogen, Ur: 1 mg/dL (ref 0.2–1.0)
pH, UA: 6 (ref 5.0–7.5)

## 2024-08-20 MED ORDER — MIRABEGRON ER 25 MG PO TB24: 25.0000 mg | ORAL_TABLET | Freq: Every day | ORAL | 11 refills | Status: AC

## 2024-08-20 NOTE — Progress Notes (Unsigned)
 "  08/20/2024 12:15 PM   Anita Michael [birth year] [id number]  Referring provider: [facility] [address] [place],  [place] [address]  No chief complaint on file.   HPI: Anita Michael is a 58yo here for followup for renal and OAb. She was diagnosed with muscular dystrophy in March 2025. Renal US  05/06/2024 shows stable renal cysts. She took mirabegron  25mg  after her last visit which improved her urinary urgency and frequency. She has burning with urination for the past 2-3 weeks. She does not have a history of frequent UTI.    PMH: Past Medical History:  Diagnosis Date   CAD (coronary artery disease)    a. s/p NSTEMI in 06/2021 with DES to mid-RCA. Residual disease along D1 and 1st Mrg with medical management recommended.   Essential hypertension    Fibromyalgia    Generalized headaches    History of stroke    Noted incidentally by brain MRI July 2022   Mitral regurgitation    a. moderate to severe by echo in 06/2021   Myotonic muscular dystrophy (HCC)    Polycythemia    PVC's (premature ventricular contractions)    Renal insufficiency    Sleep apnea    Stroke [facility])     Surgical History: Past Surgical History:  Procedure Laterality Date   ANKLE SURGERY Right    as a child   ANTERIOR CERVICAL DECOMP/DISCECTOMY FUSION N/A 02/06/2023   Procedure: Cervical Five-Cervical Six, Cervical Six-Cervical Seven. Anterior Cervical Decompression/Discectomy Fusion;  Surgeon: Debby Dorn MATSU, MD;  Location: [facility] OR;  Service: Neurosurgery;  Laterality: N/A;   APPENDECTOMY     BREAST BIOPSY Right 2015   Fibroadenoma   CHOLECYSTECTOMY     CORONARY STENT INTERVENTION N/A 07/06/2021   Procedure: CORONARY STENT INTERVENTION;  Surgeon: Wonda Sharper, MD;  Location: [facility] INVASIVE CV LAB;  Service: Cardiovascular;  Laterality: N/A;   DRUG INDUCED ENDOSCOPY Bilateral 08/06/2022   Procedure: DRUG INDUCED ENDOSCOPY;  Surgeon: Carlie Clark, MD;  Location:   SURGERY CENTER;  Service: ENT;  Laterality: Bilateral;   LEFT HEART CATH AND CORONARY ANGIOGRAPHY N/A 07/06/2021   Procedure: LEFT HEART CATH AND CORONARY ANGIOGRAPHY;  Surgeon: Wonda Sharper, MD;  Location: [facility] INVASIVE CV LAB;  Service: Cardiovascular;  Laterality: N/A;   TONSILLECTOMY      Home Medications:  Allergies as of 08/20/2024       Reactions   Nifedipine Other (See Comments), Palpitations   Heart races   Latex Rash   Neurontin  [gabapentin ] Swelling, Rash        Medication List        Accurate as of August 20, 2024 12:15 PM. If you have any questions, ask your nurse or doctor.          aspirin  EC 81 MG tablet Take 1 tablet (81 mg total) by mouth daily. Swallow whole.   atorvastatin  80 MG tablet Commonly known as: LIPITOR  TAKE 1 TABLET BY MOUTH DAILY AT 6PM.   cetirizine 10 MG tablet Commonly known as: ZYRTEC Take 10 mg by mouth daily as needed for allergies.   cholecalciferol 25 MCG (1000 UNIT) tablet Commonly known as: VITAMIN D3 Take 1,000 Units by mouth daily in the afternoon.   DULoxetine  60 MG capsule Commonly known as: CYMBALTA  Take 1 capsule (60 mg total) by mouth daily.   ezetimibe  10 MG tablet Commonly known as: ZETIA  Take 10 mg by mouth daily.   fluticasone  50 MCG/ACT nasal spray Commonly known as: FLONASE  Place 2  sprays into both nostrils daily.   isosorbide  mononitrate 30 MG 24 hr tablet Commonly known as: IMDUR  Take 1 tablet (30 mg total) by mouth daily.   losartan  100 MG tablet Commonly known as: COZAAR  TAKE ONE TABLET BY MOUTH EVERY DAY   metoprolol  succinate 25 MG 24 hr tablet Commonly known as: TOPROL -XL Take 25 mg by mouth daily.   naloxone  4 MG/0.1ML Liqd nasal spray kit Commonly known as: NARCAN  In case of opiod overdose   nitroGLYCERIN  0.4 MG SL tablet Commonly known as: NITROSTAT  Place 1 tablet (0.4 mg total) under the tongue every 5 (five) minutes x 3 doses as needed for chest pain.   nortriptyline  10 MG  capsule Commonly known as: PAMELOR  Take 2 capsules (20 mg total) by mouth at bedtime.   nystatin powder Commonly known as: MYCOSTATIN/NYSTOP 1 application. 2 (two) times daily as needed (skin irritation).   potassium chloride  SA 20 MEQ tablet Commonly known as: KLOR-CON  M Take 2 tablets (40 mEq total) by mouth daily.   Suzetrigine  50 MG Tabs Take 50 mg by mouth every 12 (twelve) hours as needed. Take two 50mg  capsules for your first dose at the start of a pain episode on an empty stomach, followed by 50mg  (1 capsule)  with or without food every 12 hours until pain episode is improved        Allergies: Allergies[1]  Family History: Family History  Problem Relation Age of Onset   Breast cancer Maternal Grandmother 6   Muscular dystrophy Father    Breast cancer Mother 27   Multiple sclerosis Mother    Multiple sclerosis Sister    Breast cancer Sister 2   Diabetes Sister    Breast cancer Sister 90   Muscular dystrophy Sister    Other Daughter        gastroparises; intestinal lymphangiectasia   Diabetes Daughter    Liver disease Daughter    Arrhythmia Daughter    Congestive Heart Failure Daughter    COPD Daughter    Other Son        nerve disease   Other Son        MMD; has a pacemaker   Other Son        MMD    Social History:  reports that she has been smoking cigarettes. She started smoking about 2 years ago. She has a 0.5 pack-year smoking history. She has never used smokeless tobacco. She reports current alcohol use. She reports that she does not use drugs.  ROS: All other review of systems were reviewed and are negative except what is noted above in HPI  Physical Exam: BP 117/79   Pulse 82   Constitutional:  Alert and oriented, No acute distress. HEENT: [place] AT, moist mucus membranes.  Trachea midline, no masses. Cardiovascular: No clubbing, cyanosis, or edema. Respiratory: Normal respiratory effort, no increased work of breathing. GI: Abdomen is soft,  nontender, nondistended, no abdominal masses GU: No CVA tenderness.  Lymph: No cervical or inguinal lymphadenopathy. Skin: No rashes, bruises or suspicious lesions. Neurologic: Grossly intact, no focal deficits, moving all 4 extremities. Psychiatric: Normal mood and affect.  Laboratory Data: Lab Results  Component Value Date   WBC 8.3 09/15/2023   HGB 14.9 09/15/2023   HCT 49.4 (H) 09/15/2023   MCV 89.8 09/15/2023   PLT 189 09/15/2023    Lab Results  Component Value Date   CREATININE 0.75 10/01/2023    No results found for: PSA  No results found for: TESTOSTERONE  Lab Results  Component Value Date   HGBA1C 6.2 (H) 07/07/2021    Urinalysis    Component Value Date/Time   APPEARANCEUR Clear 08/20/2023 1518   GLUCOSEU Negative 08/20/2023 1518   BILIRUBINUR Negative 08/20/2023 1518   PROTEINUR Negative 08/20/2023 1518   UROBILINOGEN 0.2 10/29/2021 1336   NITRITE Negative 08/20/2023 1518   LEUKOCYTESUR Trace (A) 08/20/2023 1518    Lab Results  Component Value Date   LABMICR See below: 08/20/2023   WBCUA 0-5 08/20/2023   LABEPIT 0-10 08/20/2023   BACTERIA None seen 08/20/2023    Pertinent Imaging: *** No results found for this or any previous visit.  No results found for this or any previous visit.  No results found for this or any previous visit.  No results found for this or any previous visit.  Results for orders placed during the hospital encounter of 05/06/24  Ultrasound renal complete  Narrative CLINICAL DATA:  Nephrolithiasis. History of hypertension and renal insufficiency.  EXAM: RENAL / URINARY TRACT ULTRASOUND COMPLETE  COMPARISON:  Renal ultrasound 04/24/2023  FINDINGS: Right Kidney:  Renal measurements: 10.0 cm x 3.7 cm x 4.2 cm = volume: 82 mL. Mild increased echogenicity with associated cortical thinning. No hydronephrosis visualized. No renal calculi visualized 4.1 cm x 3.3 cm x 3.7 cm and 1.3 cm x 1.1 cm x 1.0 cm simple  cysts.  Left Kidney:  Renal measurements: 12.3 cm x 5.9 cm x 4.6 cm = volume: 175 mL. Echogenicity within normal limits. No mass or hydronephrosis visualized.  Bladder:  Appears normal for degree of bladder distention. Ureteral jets not identified during the study.  Other:  None.  IMPRESSION: Redemonstration of [place] 1 cysts with no recommended follow-up needed. Asymmetric cortical thinning and increased echogenicity again seen on the right without significant change when compared with the previous study and consistent with medical renal disease. No hydronephrosis.   Electronically Signed By: Cordella Banner On: 05/09/2024 14:00  No results found for this or any previous visit.  No results found for this or any previous visit.  No results found for this or any previous visit.   Assessment & Plan:    1. Kidney cysts (Primary) Renal US  in 2 years - Urinalysis, Routine w reflex microscopic  2. OAb -restart mirabegron  25mg  daily   No follow-ups on file.  Belvie Clara, MD  [facility] [place]      [1]  Allergies Allergen Reactions   Nifedipine Other (See Comments) and Palpitations    Heart races   Latex Rash   Neurontin  [Gabapentin ] Swelling and Rash   "

## 2024-08-20 NOTE — Patient Instructions (Signed)

## 2024-08-20 NOTE — Telephone Encounter (Signed)
 Pt was in clinical today. Dr. Sherrilee requested UA. Pt was unable to voided, she will bring UA specimen back on Monday by son or daughter. Pt giving 2 specimen cups.

## 2024-08-20 NOTE — Telephone Encounter (Signed)
 Key: [id number] Rx #: [id number] Need Help? Call us  at [phone] Status sent iconSent to Plan today Drug HYDROcodone -Acetaminophen  5-325MG  tablets ePA cloud logo Form [facility] [employer] [place]  Medicaid Electronic PA Form (2017 NCPDP) Original Claim Info 27 PLAN LIMITATION EXCEEDED 5 DAYS SUPPLY.CALL [phone] OR SUBMIT PA TO [email or web address] DS O/R, USE [facility] [id number] DRUG REQUIRES PRIOR AUTHORIZATION For [employer] Coupon Price of: $20.13 submit to BIN: [id number] PCN: CP Group: [id number] --Service provided at no cost and no switch fee to the pharmacy--

## 2024-08-20 NOTE — Telephone Encounter (Signed)
 Pharmacy called stating they would not be able to order Journavx  through their wholesaler. Would have to send to maybe a chain pharmacy.

## 2024-08-22 LAB — DRUG TOX MONITOR 1 W/CONF, ORAL FLD
Amphetamines: NEGATIVE ng/mL
Barbiturates: NEGATIVE ng/mL
Benzodiazepines: NEGATIVE ng/mL
Buprenorphine: NEGATIVE ng/mL
Cocaine: NEGATIVE ng/mL
Cotinine: >250 ng/mL — ABNORMAL HIGH
Fentanyl: NEGATIVE ng/mL
Heroin Metabolite: NEGATIVE ng/mL
MARIJUANA: NEGATIVE ng/mL
MDMA: NEGATIVE ng/mL
Meprobamate: NEGATIVE ng/mL
Methadone: NEGATIVE ng/mL
Nicotine Metabolite: POSITIVE ng/mL — AB
Opiates: NEGATIVE ng/mL
Phencyclidine: NEGATIVE ng/mL
Tapentadol: NEGATIVE ng/mL
Tramadol: >500 ng/mL — ABNORMAL HIGH
Tramadol: POSITIVE ng/mL — AB
Zolpidem: NEGATIVE ng/mL

## 2024-08-22 LAB — DRUG TOX ALC METAB W/CON, ORAL FLD: Alcohol Metabolite: NEGATIVE ng/mL

## 2024-08-23 LAB — URINE CULTURE

## 2024-08-24 ENCOUNTER — Ambulatory Visit: Payer: Self-pay

## 2024-08-24 ENCOUNTER — Other Ambulatory Visit: Payer: Self-pay | Admitting: Cardiology

## 2024-08-24 MED ORDER — SUZETRIGINE 50 MG PO TABS: 50.0000 mg | ORAL_TABLET | Freq: Two times a day (BID) | ORAL | 0 refills | Status: DC | PRN

## 2024-08-24 MED ORDER — SULFAMETHOXAZOLE-TRIMETHOPRIM 800-160 MG PO TABS: 1.0000 | ORAL_TABLET | Freq: Two times a day (BID) | ORAL | 0 refills | Status: AC

## 2024-08-24 NOTE — Telephone Encounter (Signed)
Patient called and made aware of positive urine culture and Bactrim DS sent to pharmacy per Dr. Ronne Binning.

## 2024-08-24 NOTE — Telephone Encounter (Signed)
 Anita Michael has called x2 about her medication. It looks like the Journavax may need to be sent to a chain pharmacy rather than [facility] per previous message.

## 2024-08-25 ENCOUNTER — Ambulatory Visit (INDEPENDENT_AMBULATORY_CARE_PROVIDER_SITE_OTHER): Admitting: Gastroenterology

## 2024-08-26 NOTE — Telephone Encounter (Signed)
 Notified it was sent to [facility].

## 2024-08-27 ENCOUNTER — Telehealth: Payer: Self-pay | Admitting: *Deleted

## 2024-08-27 MED ORDER — TRAMADOL HCL 50 MG PO TABS: 50.0000 mg | ORAL_TABLET | Freq: Three times a day (TID) | ORAL | 0 refills | Status: AC | PRN

## 2024-08-27 MED ORDER — DULOXETINE HCL 30 MG PO CPEP: 30.0000 mg | ORAL_CAPSULE | Freq: Every day | ORAL | 0 refills | Status: AC

## 2024-08-27 NOTE — Telephone Encounter (Signed)
 Anita Michael called back and I spoke to her . She wanted to report the new medication does absolutely nothing for her.

## 2024-09-23 ENCOUNTER — Encounter: Admitting: Physical Medicine & Rehabilitation
